# Patient Record
Sex: Male | Born: 1980
Health system: Southern US, Community
[De-identification: ages and names within clinical notes are randomized; demographics above are authoritative.]

## PROBLEM LIST (undated history)

## (undated) DIAGNOSIS — I10 Essential (primary) hypertension: Secondary | ICD-10-CM

## (undated) DIAGNOSIS — E119 Type 2 diabetes mellitus without complications: Secondary | ICD-10-CM

## (undated) HISTORY — PX: TOE SURGERY: SHX1073

## (undated) HISTORY — PX: DENTAL SURGERY: SHX609

## (undated) HISTORY — DX: Type 2 diabetes mellitus without complications: E11.9

---
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
## 2005-01-15 ENCOUNTER — Emergency Department (HOSPITAL_COMMUNITY): Admission: EM | Admit: 2005-01-15 | Discharge: 2005-01-15 | Payer: Self-pay | Admitting: *Deleted

## 2011-05-08 ENCOUNTER — Encounter: Payer: Self-pay | Admitting: *Deleted

## 2011-05-08 ENCOUNTER — Emergency Department (HOSPITAL_COMMUNITY)
Admission: EM | Admit: 2011-05-08 | Discharge: 2011-05-09 | Disposition: A | Discharge status: Home / Self Care | Payer: Self-pay | Attending: Emergency Medicine | Admitting: Emergency Medicine

## 2011-05-08 DIAGNOSIS — K0381 Cracked tooth: Secondary | ICD-10-CM | POA: Insufficient documentation

## 2011-05-08 DIAGNOSIS — K089 Disorder of teeth and supporting structures, unspecified: Secondary | ICD-10-CM | POA: Insufficient documentation

## 2011-05-08 DIAGNOSIS — K0889 Other specified disorders of teeth and supporting structures: Secondary | ICD-10-CM

## 2011-05-08 NOTE — ED Notes (Signed)
Toothache since Friday.  He is getting it removed in 2 weeks

## 2011-05-09 MED ORDER — PENICILLIN V POTASSIUM 500 MG PO TABS: 500.0000 mg | ORAL_TABLET | Freq: Four times a day (QID) | ORAL | Status: AC

## 2011-05-09 MED ORDER — OXYCODONE-ACETAMINOPHEN 5-325 MG PO TABS: 2.0000 | ORAL_TABLET | ORAL | Status: AC | PRN

## 2011-05-09 MED ORDER — OXYCODONE-ACETAMINOPHEN 5-325 MG PO TABS
1.0000 | ORAL_TABLET | Freq: Once | ORAL | Status: AC
  Administered 2011-05-09: 1 via ORAL
  Filled 2011-05-09: qty 1

## 2011-05-09 NOTE — ED Provider Notes (Signed)
History     CSN: [id number] Arrival date & time: 05/08/2011 10:20 PM   First MD Initiated Contact with Patient 05/09/11 0422      Chief Complaint  Patient presents with  . Dental Pain    (Consider location/radiation/quality/duration/timing/severity/associated sxs/prior treatment) Patient is a 30 y.o. male presenting with tooth pain. The history is provided by the patient.  Dental PainThe primary symptoms include mouth pain. Primary symptoms do not include fever, sore throat or cough.  Additional symptoms do not include: drooling and ear pain.   patient has pain in upper neck tooth on left side since Friday. He states that he has an appointment to get it removed in about 2 weeks. Says it hurts when he eats cold things when he eats food. No swelling. No clear trauma. No fevers. No trouble swallowing.  History reviewed. No pertinent past medical history.  History reviewed. No pertinent past surgical history.  History reviewed. No pertinent family history.  History  Substance Use Topics  . Smoking status: Not on file  . Smokeless tobacco: Not on file  . Alcohol Use: No      Review of Systems  Constitutional: Negative for fever and chills.  HENT: Positive for dental problem. Negative for ear pain, sore throat and drooling.   Respiratory: Negative for cough.     Allergies  Sheep-derived products  Home Medications   Current Outpatient Rx  Name Route Sig Dispense Refill  . OXYCODONE-ACETAMINOPHEN 5-325 MG PO TABS Oral Take 2 tablets by mouth every 4 (four) hours as needed for pain. 15 tablet 0  . PENICILLIN V POTASSIUM 500 MG PO TABS Oral Take 1 tablet (500 mg total) by mouth 4 (four) times daily. 40 tablet 0    BP 146/98  Pulse 70  Temp(Src) 97 F (36.1 C) (Oral)  Resp 16  SpO2 100%  Physical Exam  Vitals reviewed. Constitutional: He appears well-developed and well-nourished.  HENT:  Head: Normocephalic.       Left upper posterior seventh tooth from the midline  has a broken off area and is tender. No fluctuance. No swelling of her foot mouth or gums. No bleeding.  Eyes: Pupils are equal, round, and reactive to light.  Neck: Normal range of motion. Neck supple. No thyromegaly present.  Lymphadenopathy:    He has no cervical adenopathy.    ED Course  Procedures (including critical care time)  Labs Reviewed - No data to display No results found.   1. Pain, dental       MDM  Dental pain from a broken tooth. Has followup to get it removed. No clear abscess, will give antibiotics and pain meds.        Juliet Rude. Rubin Payor, MD 05/09/11 [phone]

## 2011-05-09 NOTE — ED Notes (Signed)
No respiratory or acute distress noted resting in bed alert and oriented x 3 call light in reach. 

## 2016-11-14 ENCOUNTER — Emergency Department (HOSPITAL_COMMUNITY)
Admission: EM | Admit: 2016-11-14 | Discharge: 2016-11-14 | Disposition: A | Discharge status: Home / Self Care | Payer: 59 | Attending: Emergency Medicine | Admitting: Emergency Medicine

## 2016-11-14 ENCOUNTER — Encounter (HOSPITAL_COMMUNITY): Payer: Self-pay

## 2016-11-14 ENCOUNTER — Emergency Department (HOSPITAL_COMMUNITY): Payer: 59

## 2016-11-14 DIAGNOSIS — L03031 Cellulitis of right toe: Secondary | ICD-10-CM | POA: Insufficient documentation

## 2016-11-14 DIAGNOSIS — M7989 Other specified soft tissue disorders: Secondary | ICD-10-CM | POA: Diagnosis present

## 2016-11-14 MED ORDER — NAPROXEN 500 MG PO TABS: 500.0000 mg | ORAL_TABLET | Freq: Two times a day (BID) | ORAL | 0 refills | Status: DC

## 2016-11-14 MED ORDER — CEPHALEXIN 500 MG PO CAPS: 500.0000 mg | ORAL_CAPSULE | Freq: Four times a day (QID) | ORAL | 0 refills | Status: DC

## 2016-11-14 NOTE — ED Notes (Signed)
Bed: WTR8 Expected date:  Expected time:  Means of arrival:  Comments: 

## 2016-11-14 NOTE — Discharge Instructions (Signed)
Please read attached information. If you experience any new or worsening signs or symptoms please return to the emergency room for evaluation. Please follow-up with your primary care provider or specialist as discussed. Please use medication prescribed only as directed and discontinue taking if you have any concerning signs or symptoms.   °

## 2016-11-14 NOTE — ED Provider Notes (Signed)
[facility]-EMERGENCY DEPT Provider Note   CSN: [id number] Arrival date & time: 11/14/16  [id number]  By signing my name below, I, Rosario AdieWilliam Andrew Hiatt, attest that this documentation has been prepared under the direction and in the presence of Newell RubbermaidJeffrey Zelena Bushong, PA-C.  Electronically Signed: Rosario AdieWilliam Andrew Hiatt, ED Scribe. 11/14/16. 10:54 AM.  History   Chief Complaint Chief Complaint  Patient presents with  . Toe Injury   The history is provided by the patient. No language interpreter was used.    HPI Comments: Mark Schneider is a 36 y.o. male with no pertinent PMHx, who presents to the Emergency Department complaining of  left great toe pain beginning yesterday. Per pt, he does not remember a specific injury event to the area, however, he thinks that he may have struck the toe while walking recently. He notes associated swelling over the toe. His pain is exacerbated with movement of the joint. No treatments for his pain were tried prior to coming into the ED. He denies numbness, weakness, or any other associated symptoms.   History reviewed. No pertinent past medical history.  There are no active problems to display for this patient.  Past Surgical History:  Procedure Laterality Date  . DENTAL SURGERY    . TOE SURGERY      Home Medications    Prior to Admission medications   Medication Sig Start Date End Date Taking? Authorizing Provider  cephALEXin (KEFLEX) 500 MG capsule Take 1 capsule (500 mg total) by mouth 4 (four) times daily. 11/14/16   Thayne Cindric, Tinnie GensJeffrey, PA-C  naproxen (NAPROSYN) 500 MG tablet Take 1 tablet (500 mg total) by mouth 2 (two) times daily. 11/14/16   Eyvonne MechanicHedges, Aysa Larivee, PA-C   Family History History reviewed. No pertinent family history.  Social History Social History  Substance Use Topics  . Smoking status: Never Smoker  . Smokeless tobacco: Never Used  . Alcohol use No   Allergies   Sheep-derived products and Shellfish allergy  Review of Systems Review of  Systems  Musculoskeletal: Positive for arthralgias, joint swelling and myalgias.  Neurological: Negative for weakness and numbness.  All other systems reviewed and are negative.  Physical Exam Updated Vital Signs BP (!) 179/102 (BP Location: Left Arm)   Pulse 91   Temp 98.8 F (37.1 C) (Oral)   Resp (!) 22   Ht 6' (1.829 m)   Wt (!) 140.6 kg   SpO2 98%   BMI 42.04 kg/m   Physical Exam  Constitutional: He appears well-developed and well-nourished. No distress.  HENT:  Head: Normocephalic and atraumatic.  Eyes: Conjunctivae are normal.  Neck: Normal range of motion.  Cardiovascular: Normal rate.   Pulmonary/Chest: Effort normal.  Abdominal: He exhibits no distension.  Musculoskeletal: Normal range of motion. He exhibits edema and tenderness.  Swelling along the dorsal aspect of the left great toe. There is TTP. No fluctuance. Slight warmth and redness to dorsal aspect. Plantar aspect NTTP with no redness or warmth  Neurological: He is alert.  Skin: No pallor.  Psychiatric: He has a normal mood and affect. His behavior is normal.  Nursing note and vitals reviewed.  ED Treatments / Results  DIAGNOSTIC STUDIES: Oxygen Saturation is 98% on RA, normal by my interpretation.   COORDINATION OF CARE: 10:54 AM-Discussed next steps with pt. Pt verbalized understanding and is agreeable with the plan.   Radiology Dg Toe Great Left  Result Date: 11/14/2016 CLINICAL DATA:  Great toe pain. EXAM: LEFT GREAT TOE COMPARISON:  None. FINDINGS: There  is no evidence of fracture or dislocation. There is no evidence of arthropathy or other focal bone abnormality. Soft tissues are unremarkable. IMPRESSION: Negative. Electronically Signed   By: Francene Boyers M.D.   On: 11/14/2016 11:52    Procedures Procedures   Medications Ordered in ED Medications - No data to display  Initial Impression / Assessment and Plan / ED Course  I have reviewed the triage vital signs and the nursing  notes.  Pertinent labs & imaging results that were available during my care of the patient were reviewed by me and considered in my medical decision making (see chart for details).     Labs:   Imaging: DG L great toe  Consults:   Therapeutics:   Discharge Meds: Keflex, ibuprofen  Assessment/Plan: 36 year old male presents today with complaints of toe pain. He denies any trauma. Patient does have some slight warmth and signs of cellulitis to his toe. I cannot appreciate any significant abscess that would be amenable to I&D, ultrasound at bedside should note fluid collection. Patient does not have any history of gout, I have lower suspicion for gouty arthritis, septic arthritis as this seems to be superficial over the dorsal aspect. I will cover patient with Keflex for likely infection, ibuprofen as needed for discomfort, and close follow-up with primary care or the ED if symptoms worsen. Patient is afebrile and nontoxic with no signs of systemic illness. Pt verbalized understanding and agreement to today's plan had no further questions or concerns at the time of discharge.    Final Clinical Impressions(s) / ED Diagnoses   Final diagnoses:  Cellulitis of toe of right foot   New Prescriptions Discharge Medication List as of 11/14/2016  1:06 PM    START taking these medications   Details  cephALEXin (KEFLEX) 500 MG capsule Take 1 capsule (500 mg total) by mouth 4 (four) times daily., Starting Tue 11/14/2016, Print    naproxen (NAPROSYN) 500 MG tablet Take 1 tablet (500 mg total) by mouth 2 (two) times daily., Starting Tue 11/14/2016, Print       I personally performed the services described in this documentation, which was scribed in my presence. The recorded information has been reviewed and is accurate.       Eyvonne Mechanic, PA-C 11/14/16 1801    Mancel Bale, MD 11/14/16 2003

## 2016-11-14 NOTE — ED Triage Notes (Signed)
Patient c/o left great toe pain since yesterday and thinks he hit his toe on  The wall or coffee table. Patient states the pain impairs weight-bearing on the foot.

## 2017-05-08 ENCOUNTER — Other Ambulatory Visit: Payer: Self-pay

## 2017-05-08 ENCOUNTER — Emergency Department (HOSPITAL_COMMUNITY)
Admission: EM | Admit: 2017-05-08 | Discharge: 2017-05-08 | Disposition: A | Discharge status: Home / Self Care | Payer: 59 | Attending: Emergency Medicine | Admitting: Emergency Medicine

## 2017-05-08 ENCOUNTER — Encounter (HOSPITAL_COMMUNITY): Payer: Self-pay | Admitting: Emergency Medicine

## 2017-05-08 DIAGNOSIS — Z79899 Other long term (current) drug therapy: Secondary | ICD-10-CM | POA: Diagnosis not present

## 2017-05-08 DIAGNOSIS — M79671 Pain in right foot: Secondary | ICD-10-CM | POA: Diagnosis present

## 2017-05-08 NOTE — ED Triage Notes (Signed)
Pt complaint of right foot pain onset this am with weight bearing; denies injury. Hx of cellulitis to left foot; pt concerned for same. No obvious signs of infection noted with triage.

## 2017-05-08 NOTE — ED Provider Notes (Signed)
[facility]-EMERGENCY DEPT Provider Note   CSN: [id number] Arrival date & time: 05/08/17  1133     History   Chief Complaint Chief Complaint  Patient presents with  . Foot Pain    HPI Mark Schneider is a 36 y.o. male who presents today with chief complaint acute onset, intermittent right foot pain which began early this morning.  States that he noticed the pain when he awoke, worsens with ambulation and standing for long periods of time.  Pain is throbbing and sharp in nature.  It radiates from the plantar surface of the heel up to the arch.  Denies numbness, tingling, weakness, fevers, chills.  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  He has tried ibuprofen and leftover antibiotic (of which he is unsure of the name) which he has from a cellulitis which occurred in his left toe several months ago.  The history is provided by the patient.    History reviewed. No pertinent past medical history.  There are no active problems to display for this patient.   Past Surgical History:  Procedure Laterality Date  . DENTAL SURGERY    . TOE SURGERY         Home Medications    Prior to Admission medications   Medication Sig Start Date End Date Taking? Authorizing Provider  cephALEXin (KEFLEX) 500 MG capsule Take 1 capsule (500 mg total) by mouth 4 (four) times daily. 11/14/16   Hedges, Tinnie GensJeffrey, PA-C  naproxen (NAPROSYN) 500 MG tablet Take 1 tablet (500 mg total) by mouth 2 (two) times daily. 11/14/16   Eyvonne MechanicHedges, Jeffrey, PA-C    Family History No family history on file.  Social History Social History   Tobacco Use  . Smoking status: Never Smoker  . Smokeless tobacco: Never Used  Substance Use Topics  . Alcohol use: No  . Drug use: No     Allergies   Sheep-derived products and Shellfish allergy   Review of Systems Review of Systems  Constitutional: Negative for chills and fever.  Musculoskeletal:  Positive for arthralgias (Right foot).  Neurological: Negative for weakness and numbness.     Physical Exam Updated Vital Signs BP 111/67 (BP Location: Right Arm)   Pulse 89   Temp 98.6 F (37 C) (Oral)   Resp 18   Physical Exam  Constitutional: He appears well-developed and well-nourished. No distress.  HENT:  Head: Normocephalic and atraumatic.  Eyes: Conjunctivae are normal. Right eye exhibits no discharge. Left eye exhibits no discharge.  Neck: No JVD present. No tracheal deviation present.  Cardiovascular: Normal rate and intact distal pulses.  2+ DP/PT pulses bilaterally.  Pulmonary/Chest: Effort normal.  Abdominal: He exhibits no distension.  Musculoskeletal: Normal range of motion. He exhibits tenderness. He exhibits no edema.  Normal range of motion of the right ankle and foot.  5/5 strength with flexion and extension against resistance of the right foot major muscle groups including EHL. no swelling, warmth, erythema, deformity, or crepitus noted.  He is tender to palpation on the plantar surface of the right foot from the anterior aspect of the heel to the balls of the feet.  Examination of the Achilles tendon is normal.  Neurological: He is alert. No sensory deficit. He exhibits normal muscle tone.  Fluent speech, no facial droop, sensation intact to soft touch of bilateral lower extremities.  Antalgic gait, but  patient able to Heel Walk and Toe Walk  although it is painful.  Skin: Skin is warm and dry. No erythema.  Psychiatric: He has a normal mood and affect. His behavior is normal.  Nursing note and vitals reviewed.    ED Treatments / Results  Labs (all labs ordered are listed, but only abnormal results are displayed) Labs Reviewed - No data to display  EKG  EKG Interpretation None       Radiology No results found.  Procedures Procedures (including critical care time)  Medications Ordered in ED Medications - No data to display   Initial  Impression / Assessment and Plan / ED Course  I have reviewed the triage vital signs and the nursing notes.  Pertinent labs & imaging results that were available during my care of the patient were reviewed by me and considered in my medical decision making (see chart for details).     Patient with right heel and foot pain.  No trauma or falls, but he does walk and stand for long periods of time at work.  Afebrile, vital signs are stable.  History and physical examination suggestive of plantar fasciitis.  Low suspicion of fracture, dislocation, cellulitis, gout, or septic joints.  Therapy indicated and discussed, discharge patient with Cam walker and crutches.RICE he will follow-up with podiatry if symptoms persist.  Also given information of stretching exercises and taping for relief.  Discussed indications for return to the ED. Pt verbalized understanding of and agreement with plan and is safe for discharge home at this time.  No complaints prior to discharge.  Final Clinical Impressions(s) / ED Diagnoses   Final diagnoses:  Acute pain of right foot    ED Discharge Orders    None       Bennye AlmFawze, Braylei Totino A, PA-C 05/08/17 1228    Azalia Bilisampos, Kevin, MD 05/09/17 2044

## 2017-05-08 NOTE — Discharge Instructions (Signed)
Your symptoms and physical exam are suggestive of plantar fasciitis. Alternate 600 mg of ibuprofen and [phone] mg of Tylenol every 3 hours as needed for pain. Do not exceed 4000 mg of Tylenol daily.  Wear the Cam walker and use the crutches to ambulate for comfort.  I have attached stretching exercises and foot taping information that may be helpful to you as well.  Follow-up with a podiatrist for reevaluation.  Return to the ED immediately for any concerning signs or symptoms develop such as fever, redness, swelling, or weakness.  How is plantar fasciitis treated? -- The first step is to try the things you can do on your own. But if you do not get better, or your symptoms are severe, your doctor or nurse might suggest:  ?Taping up your foot in a special way that helps the support the foot (picture 1)  ?Special shoe inserts, made to fit your foot  ?Shots (that go into your foot) of a medicine called a steroid, which can help with the pain  ?Putting a splint over your foot and ankle  ?Surgery (this is an option only in some cases that do not get better with other treatments)  Some doctors also suggest a treatment called "shock wave therapy." This treatment is painful and has not been proven to work.  Is there anything I can do to keep from getting heel pain again? -- Yes. To reduce the chances that your pain will come back:  ?Wear shoes that fit well, have a lot of cushion, and support the heel and ankle  ?Avoid wearing slippers, flip-flops, slip-ons, or poorly fitted shoes  ?Avoid going barefoot  ?Do not wear worn-out shoes

## 2017-05-13 ENCOUNTER — Encounter (HOSPITAL_COMMUNITY): Payer: Self-pay | Admitting: Emergency Medicine

## 2017-05-13 ENCOUNTER — Ambulatory Visit (HOSPITAL_COMMUNITY)
Admission: EM | Admit: 2017-05-13 | Discharge: 2017-05-13 | Disposition: A | Discharge status: Home / Self Care | Payer: 59 | Attending: Family Medicine | Admitting: Family Medicine

## 2017-05-13 DIAGNOSIS — M7741 Metatarsalgia, right foot: Secondary | ICD-10-CM | POA: Diagnosis not present

## 2017-05-13 NOTE — ED Triage Notes (Signed)
Pt here for right foot pain; pt sts seen here for same with some improvement currently

## 2017-05-13 NOTE — ED Provider Notes (Signed)
[facility]    CSN: [id number] Arrival date & time: 05/13/17  1205     History   Chief Complaint Chief Complaint  Patient presents with  . Foot Pain    HPI Melanie A Rahl is a 36 y.o. male.   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 He was recently evaluated for this pain and given Aleve. He took a week off from work last week and his foot is getting better but still hurting.      History reviewed. No pertinent past medical history.  There are no active problems to display for this patient.   Past Surgical History:  Procedure Laterality Date  . DENTAL SURGERY    . TOE SURGERY         Home Medications    Prior to Admission medications   Not on File    Family History History reviewed. No pertinent family history.  Social History Social History   Tobacco Use  . Smoking status: Never Smoker  . Smokeless tobacco: Never Used  Substance Use Topics  . Alcohol use: No  . Drug use: No     Allergies   Sheep-derived products and Shellfish allergy   Review of Systems Review of Systems  Musculoskeletal: Positive for gait problem.  All other systems reviewed and are negative.    Physical Exam Triage Vital Signs ED Triage Vitals [05/13/17 1255]  Enc Vitals Group     BP (!) 180/102     Pulse Rate 84     Resp 18     Temp 98 F (36.7 C)     Temp Source Oral     SpO2 99 %     Weight      Height      Head Circumference      Peak Flow      Pain Score 4     Pain Loc      Pain Edu?      Excl. in GC?    No data found.  Updated Vital Signs BP (!) 180/102 (BP Location: Right Arm)   Pulse 84   Temp 98 F (36.7 C) (Oral)   Resp 18   SpO2 99%   Visual Acuity Right Eye Distance:   Left Eye Distance:   Bilateral Distance:    Right Eye Near:   Left Eye Near:    Bilateral Near:     Physical Exam  Constitutional: He is oriented to person, place, and time. He appears well-developed  and well-nourished.  HENT:  Right Ear: External ear normal.  Left Ear: External ear normal.  Eyes: Conjunctivae are normal. Pupils are equal, round, and reactive to light.  Neck: Normal range of motion. Neck supple.  Pulmonary/Chest: Effort normal.  Musculoskeletal: Normal range of motion.  Tender metatarsal arch area  Neurological: He is alert and oriented to person, place, and time.  Skin: Skin is warm and dry.  Nursing note and vitals reviewed.    UC Treatments / Results  Labs (all labs ordered are listed, but only abnormal results are displayed) Labs Reviewed - No data to display  EKG  EKG Interpretation None       Radiology No results found.  Procedures Procedures (including critical care time)  Medications Ordered in UC Medications - No data to display   Initial Impression / Assessment and Plan / UC Course  I have reviewed the triage vital signs and the nursing  notes.  Pertinent labs & imaging results that were available during my care of the patient were reviewed by me and considered in my medical decision making (see chart for details).    Final Clinical Impressions(s) / UC Diagnoses   Final diagnoses:  Metatarsalgia of right foot    ED Discharge Orders    None       Controlled Substance Prescriptions [place] Controlled Substance Registry consulted? Not Applicable   Elvina SidleLauenstein, Ewald Beg, MD 05/13/17 1339

## 2017-05-13 NOTE — Discharge Instructions (Signed)
Get a metatarsal arch insert for your shoe. Ask about better cushioned shoes.  Ibuprofen should be good for controlling some of the discomfort or graft to lose weight, you will be hungry some the time. Rely on Seltzer water or sparkling water to help control the appetite. Avoid carbohydrates: Potatoes, possibly, rice, bread.  Rely on protein to help with the appetite suppression: Egg whites scrambled, snacks such as fruit slices for snacks.

## 2017-10-31 DIAGNOSIS — E119 Type 2 diabetes mellitus without complications: Secondary | ICD-10-CM

## 2017-10-31 HISTORY — DX: Type 2 diabetes mellitus without complications: E11.9

## 2017-11-15 ENCOUNTER — Other Ambulatory Visit: Payer: Self-pay

## 2017-11-15 ENCOUNTER — Observation Stay (HOSPITAL_COMMUNITY)
Admission: EM | Admit: 2017-11-15 | Discharge: 2017-11-16 | Disposition: A | Discharge status: Home / Self Care | Payer: 59 | Attending: Internal Medicine | Admitting: Internal Medicine

## 2017-11-15 ENCOUNTER — Encounter (HOSPITAL_COMMUNITY): Payer: Self-pay | Admitting: Nurse Practitioner

## 2017-11-15 DIAGNOSIS — Z6841 Body Mass Index (BMI) 40.0 and over, adult: Secondary | ICD-10-CM | POA: Insufficient documentation

## 2017-11-15 DIAGNOSIS — Z7984 Long term (current) use of oral hypoglycemic drugs: Secondary | ICD-10-CM | POA: Insufficient documentation

## 2017-11-15 DIAGNOSIS — E1165 Type 2 diabetes mellitus with hyperglycemia: Principal | ICD-10-CM | POA: Insufficient documentation

## 2017-11-15 DIAGNOSIS — R7989 Other specified abnormal findings of blood chemistry: Secondary | ICD-10-CM

## 2017-11-15 DIAGNOSIS — E119 Type 2 diabetes mellitus without complications: Secondary | ICD-10-CM

## 2017-11-15 DIAGNOSIS — N179 Acute kidney failure, unspecified: Secondary | ICD-10-CM

## 2017-11-15 HISTORY — DX: Acute kidney failure, unspecified: N17.9

## 2017-11-15 LAB — URINALYSIS, ROUTINE W REFLEX MICROSCOPIC
Bilirubin Urine: NEGATIVE
HGB URINE DIPSTICK: NEGATIVE
Ketones, ur: NEGATIVE mg/dL
Leukocytes, UA: NEGATIVE
Nitrite: NEGATIVE
Protein, ur: NEGATIVE mg/dL
SPECIFIC GRAVITY, URINE: 1.027 (ref 1.005–1.030)
pH: 5 (ref 5.0–8.0)

## 2017-11-15 LAB — GLUCOSE, CAPILLARY
GLUCOSE-CAPILLARY: 431 mg/dL — AB (ref 65–99)
Glucose-Capillary: 569 mg/dL (ref 65–99)

## 2017-11-15 LAB — CBC
HEMATOCRIT: 42.8 % (ref 39.0–52.0)
HEMOGLOBIN: 15.2 g/dL (ref 13.0–17.0)
MCH: 30.8 pg (ref 26.0–34.0)
MCHC: 35.5 g/dL (ref 30.0–36.0)
MCV: 86.8 fL (ref 78.0–100.0)
Platelets: 204 10*3/uL (ref 150–400)
RBC: 4.93 MIL/uL (ref 4.22–5.81)
RDW: 12.5 % (ref 11.5–15.5)
WBC: 7 10*3/uL (ref 4.0–10.5)

## 2017-11-15 LAB — BASIC METABOLIC PANEL
ANION GAP: 14 (ref 5–15)
BUN: 19 mg/dL (ref 6–20)
CHLORIDE: 94 mmol/L — AB (ref 101–111)
CO2: 24 mmol/L (ref 22–32)
Calcium: 9.6 mg/dL (ref 8.9–10.3)
Creatinine, Ser: 1.84 mg/dL — ABNORMAL HIGH (ref 0.61–1.24)
GFR calc Af Amer: 53 mL/min — ABNORMAL LOW (ref >60)
GFR, EST NON AFRICAN AMERICAN: 46 mL/min — AB (ref >60)
GLUCOSE: 755 mg/dL — AB (ref 65–99)
POTASSIUM: 4.5 mmol/L (ref 3.5–5.1)
Sodium: 132 mmol/L — ABNORMAL LOW (ref 135–145)

## 2017-11-15 LAB — CBG MONITORING, ED: Glucose-Capillary: >600 mg/dL (ref 65–99)

## 2017-11-15 LAB — HEMOGLOBIN A1C
Hgb A1c MFr Bld: 10.5 % — ABNORMAL HIGH (ref 4.8–5.6)
Mean Plasma Glucose: 254.65 mg/dL

## 2017-11-15 MED ORDER — ONDANSETRON HCL 4 MG PO TABS: 4.0000 mg | ORAL_TABLET | Freq: Four times a day (QID) | ORAL | Status: DC | PRN

## 2017-11-15 MED ORDER — ACETAMINOPHEN 325 MG PO TABS
650.0000 mg | ORAL_TABLET | Freq: Four times a day (QID) | ORAL | Status: DC | PRN
  Administered 2017-11-16: 650 mg via ORAL
  Filled 2017-11-15: qty 2

## 2017-11-15 MED ORDER — SODIUM CHLORIDE 0.9 % IV SOLN
INTRAVENOUS | Status: DC
  Filled 2017-11-15: qty 1

## 2017-11-15 MED ORDER — INSULIN ASPART 100 UNIT/ML ~~LOC~~ SOLN
0.0000 [IU] | SUBCUTANEOUS | Status: DC
  Administered 2017-11-15: 20 [IU] via SUBCUTANEOUS
  Administered 2017-11-16: 15 [IU] via SUBCUTANEOUS
  Administered 2017-11-16: 4 [IU] via SUBCUTANEOUS
  Administered 2017-11-16: 20 [IU] via SUBCUTANEOUS
  Administered 2017-11-16: 15 [IU] via SUBCUTANEOUS

## 2017-11-15 MED ORDER — DOCUSATE SODIUM 100 MG PO CAPS
100.0000 mg | ORAL_CAPSULE | Freq: Two times a day (BID) | ORAL | Status: DC
  Administered 2017-11-15: 100 mg via ORAL
  Filled 2017-11-15: qty 1

## 2017-11-15 MED ORDER — SODIUM CHLORIDE 0.9 % IV BOLUS
1000.0000 mL | Freq: Once | INTRAVENOUS | Status: AC
  Administered 2017-11-15: 1000 mL via INTRAVENOUS

## 2017-11-15 MED ORDER — INSULIN ASPART 100 UNIT/ML ~~LOC~~ SOLN
10.0000 [IU] | Freq: Once | SUBCUTANEOUS | Status: AC
  Administered 2017-11-15: 10 [IU] via SUBCUTANEOUS
  Filled 2017-11-15: qty 1

## 2017-11-15 MED ORDER — INSULIN GLARGINE 100 UNIT/ML ~~LOC~~ SOLN
10.0000 [IU] | Freq: Every day | SUBCUTANEOUS | Status: DC
  Administered 2017-11-15: 10 [IU] via SUBCUTANEOUS
  Filled 2017-11-15 (×2): qty 0.1

## 2017-11-15 MED ORDER — ONDANSETRON HCL 4 MG/2ML IJ SOLN: 4.0000 mg | Freq: Four times a day (QID) | INTRAMUSCULAR | Status: DC | PRN

## 2017-11-15 MED ORDER — ACETAMINOPHEN 650 MG RE SUPP: 650.0000 mg | Freq: Four times a day (QID) | RECTAL | Status: DC | PRN

## 2017-11-15 MED ORDER — SODIUM CHLORIDE 0.9 % IV SOLN
INTRAVENOUS | Status: DC
  Administered 2017-11-15: 19:00:00 via INTRAVENOUS

## 2017-11-15 MED ORDER — SODIUM CHLORIDE 0.9 % IV SOLN
INTRAVENOUS | Status: DC
  Administered 2017-11-15 – 2017-11-16 (×2): via INTRAVENOUS

## 2017-11-15 NOTE — ED Notes (Signed)
Date and time results received: 11/15/17  4:59 PM  (use smartphrase ".now" to insert current time)  Test: CBG Critical Value: 755  Name of Provider Notified: Notified Rise Mu, RN  Orders Received? Or Actions Taken?: Will continue to monitor patient.

## 2017-11-15 NOTE — ED Triage Notes (Signed)
Patient here for possible bladder infection. He is having frequency and burning while urinating. Patient also stated when he woke up this morning his pinky finger was numb for a little while.

## 2017-11-15 NOTE — H&P (Signed)
History and Physical    Kayleb A Buehring ZOX:[id number] DOB: 1980/12/23 DOA: 11/15/2017  PCP:   Deboraha Sprang at [facility]:  None Patient coming from:  Home - lives with Steffanie Rainwater; NOK:  Steffanie Rainwater, mother, sister - [phone]   Chief Complaint:  ?UTI  HPI: Mark Schneider is a 37 y.o. male with no significant past medical history other than morbid obesity (BMP 55) presenting with symptomatic new-onset DM.  For the past 1-2 weeks, he has been having polyuria and not sleeping well, very tired and sluggish.  Poor appetite recently.  The one night he did eat a lot, he had a lot of GI upset.  Nausea without vomiting, abdominal pain.  +polyuria.  He drank 4 cups on water in the ER so far.  UOP maybe 10 times daily, 4-5 times of nocturia.     ED Course:  Came in with polyuria/polydipsia, new DM.  Glucose 755.  [facility] in 10/18, no apparent DM then.  Recommended an insulin drip and IVF.  No evidence of DKA, normal gap.    Review of Systems: As per HPI; otherwise review of systems reviewed and negative.   Ambulatory Status:  Ambulates without assistance  History reviewed. No pertinent past medical history.  Past Surgical History:  Procedure Laterality Date  . DENTAL SURGERY    . TOE SURGERY      Social History   Socioeconomic History  . Marital status: Single    Spouse name: Not on file  . Number of children: Not on file  . Years of education: Not on file  . Highest education level: Not on file  Occupational History  . 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 Financial resource strain: Not on file  . Food insecurity:    Worry: Not on file    Inability: Not on file  . Transportation needs:    Medical: Not on file    Non-medical: Not on file  Tobacco Use  . Smoking status: Never Smoker  . Smokeless tobacco: Never Used  Substance and Sexual Activity  . Alcohol use: No  . Drug use: No  . Sexual activity: Not Currently  Lifestyle  . Physical activity:    Days  per week: Not on file    Minutes per session: Not on file  . Stress: Not on file  Relationships  . Social connections:    Talks on phone: Not on file    Gets together: Not on file    Attends religious service: Not on file    Active member of club or organization: Not on file    Attends meetings of clubs or organizations: Not on file    Relationship status: Not on file  . Intimate partner violence:    Fear of current or ex partner: Not on file    Emotionally abused: Not on file    Physically abused: Not on file    Forced sexual activity: Not on file  Other Topics Concern  . Not on file  Social History Narrative  . Not on file    Allergies  Allergen Reactions  . Sheep-Derived Products Swelling  . Shellfish Allergy     Family History  Problem Relation Age of Onset  . Diabetes Mother   . Diabetes Sister   . Diabetes Maternal Grandmother   . Diabetes Maternal Uncle     Prior to Admission medications   Not on File    Physical Exam: Vitals:   11/15/17 1526 11/15/17  1541  BP: (!) 140/99   Pulse: (!) 103   Resp: 18   Temp: 98.3 F (36.8 C)   TempSrc: Oral   SpO2: 97%   Weight: (!) 172.4 kg (380 lb) (!) 172.4 kg (380 lb)  Height:  (1.778 m)  (1.778 m)     General:  Appears calm and comfortable and is NAD Eyes:  PERRL, EOMI, normal lids, iris ENT:  grossly normal hearing, lips & tongue, mmm; appropriate dentition Neck:  no LAD, masses or thyromegaly Cardiovascular:  RRR, no m/r/g. No LE edema.  Respiratory:   CTA bilaterally with no wheezes/rales/rhonchi.  Normal respiratory effort. Abdomen:  soft, NT, ND, NABS Back:   normal alignment, no CVAT Skin:  no rash or induration seen on limited exam Musculoskeletal:  grossly normal tone BUE/BLE, good ROM, no bony abnormality Psychiatric:  grossly normal mood and affect, speech fluent and appropriate, AOx3 Neurologic:  CN 2-12 grossly intact, moves all extremities in coordinated fashion, sensation  intact    Radiological Exams on Admission: No results found.  EKG:  Not done   Labs on Admission: I have personally reviewed the available labs and imaging studies at the time of the admission.  Pertinent labs:   Na++ 132 Glucose 755 BUN 19/Creatinine 1.84/GFR 53 Normal CBC UA: >500 glucose   Assessment/Plan Principal Problem:   New onset type 2 diabetes mellitus (HCC) Active Problems:   Acute renal failure (ARF) (HCC)   Morbid obesity with BMI of 50.0-59.9, adult (HCC)   New onset type 2 DM -Patient presenting with symptomatic new-onset DM -Normal anion gap, normal CO2 -While this could be managed as an outpatient, it is reasonable to observe him overnight given the significant hyperglycemia -Significant diabetes education provided at the time of the encounter including discussion about all of the sequelae of chronic diabetes and the importance of good glycemic control -While he is unlikely to need long-standing insulin with new type 2 DM, he will need insulin for now to try to obtain improved control -An insulin drip is not necessary at this time -Will hydrate and give 10 units Lantus tonight as well as q4h SSI coverage -Formal diabetes education tomorrow -Anticipate d/c to home tomorrow and will need good outpatient f/u -CM consult to assist with ensuring adequate outpatient f/u as well as diabetes supplies -He will need an outpatient eye exam and good foot care. -IVF for now at 150 cc/hour - no boluses since his bicarb is normal and he does not have an anion gap.  Acute renal failure  -Baseline creatinine is unknown, but suspect that this otherwise healthy male has relatively normal baseline renal function.   -Likely due to prerenal failure secondary to dehydration in the setting of new-onset DM and intermittent NSAIDs use. -He is likely to need an ACE in the future for renal protection from DM. -IVF as above -Follow up renal function by BMP -Avoid ACEI and NSAIDs  for now.  Morbid obesity -Dietary counseling provided but he will need ongoing outpatient encouragement of good dietary habits -He will need to start a weight loss program including diet and exercise.   DVT prophylaxis: Early ambulation Code Status:  Full  Family Communication: Steffanie Rainwater present throughout evaluation  Disposition Plan:  Home once clinically improved Consults called: Diabetes coordinator; CM  Admission status: It is my clinical opinion that referral for OBSERVATION is reasonable and necessary in this patient based on the above information provided. The aforementioned taken together are felt to place the  patient at high risk for further clinical deterioration. However it is anticipated that the patient may be medically stable for discharge from the hospital within 24 to 48 hours.    Jonah Blue MD [facility]  If note is complete, please contact covering daytime or nighttime physician. [email or web address] Password [email or web address]  11/15/2017, 8:29 PM

## 2017-11-15 NOTE — ED Provider Notes (Signed)
[facility]-EMERGENCY DEPT Provider Note   CSN: [id number] Arrival date & time: 11/15/17  1508     History   Chief Complaint Chief Complaint  Patient presents with  . Hyperglycemia    HPI Mark Schneider is a 37 y.o. male with no significant past medical history originally presented to the emergency department today for concerns for possible UTI.  Patient states over the last 2 weeks he has been having polydipsia as well as polyuria.  He notes that on Monday he had some abdominal cramping as well as some nausea.  He thought his symptoms may be related to a urinary tract infection so he came into be evaluated.  He was noted to have an elevated blood sugar on his triage lab work.  He states he has significant family history of diabetes but none personally.  He states he does have a PCP with [facility] with his last appointment in October.  Patient denies any current fever, chills, chest pain, shortness of breath, abdominal pain, nausea/vomiting/diarrhea, dysuria, hematuria, flank pain, or any other symptoms.  HPI  History reviewed. No pertinent past medical history.  There are no active problems to display for this patient.   Past Surgical History:  Procedure Laterality Date  . DENTAL SURGERY    . TOE SURGERY          Home Medications    Prior to Admission medications   Not on File    Family History History reviewed. No pertinent family history.  Social History Social History   Tobacco Use  . Smoking status: Never Smoker  . Smokeless tobacco: Never Used  Substance Use Topics  . Alcohol use: No  . Drug use: No     Allergies   Sheep-derived products and Shellfish allergy   Review of Systems Review of Systems  All other systems reviewed and are negative.    Physical Exam Updated Vital Signs BP (!) 140/99 (BP Location: Left Arm)   Pulse (!) 103   Temp 98.3 F (36.8 C) (Oral)   Resp 18   Ht  (1.778 m)   Wt (!) 172.4 kg (380 lb)    SpO2 97%   BMI 54.52 kg/m   Physical Exam  Constitutional: He appears well-developed and well-nourished.  Obese male in no acute distress  HENT:  Head: Normocephalic and atraumatic.  Right Ear: External ear normal.  Left Ear: External ear normal.  Nose: Nose normal.  Mouth/Throat: Uvula is midline, oropharynx is clear and moist and mucous membranes are normal. No tonsillar exudate.  Eyes: Pupils are equal, round, and reactive to light. Right eye exhibits no discharge. Left eye exhibits no discharge. No scleral icterus.  Neck: Trachea normal. Neck supple. No spinous process tenderness present. No neck rigidity. Normal range of motion present.  Cardiovascular: Normal rate, regular rhythm and intact distal pulses.  No murmur heard. Pulses:      Radial pulses are 2+ on the right side, and 2+ on the left side.       Dorsalis pedis pulses are 2+ on the right side, and 2+ on the left side.       Posterior tibial pulses are 2+ on the right side, and 2+ on the left side.  No lower extremity swelling or edema. Calves symmetric in size bilaterally.  Pulmonary/Chest: Effort normal and breath sounds normal. He exhibits no tenderness.  Abdominal: Soft. Bowel sounds are normal. There is no tenderness. There is no rigidity, no rebound, no guarding and  no CVA tenderness.  Musculoskeletal: He exhibits no edema.  Lymphadenopathy:    He has no cervical adenopathy.  Neurological: He is alert.  Skin: Skin is warm and dry. No rash noted. He is not diaphoretic.  Psychiatric: He has a normal mood and affect.  Nursing note and vitals reviewed.    ED Treatments / Results  Labs (all labs ordered are listed, but only abnormal results are displayed) Labs Reviewed  URINALYSIS, ROUTINE W REFLEX MICROSCOPIC - Abnormal; Notable for the following components:      Result Value   Color, Urine COLORLESS (*)    Glucose, UA >=500 (*)    Bacteria, UA RARE (*)    All other components within normal limits  BASIC  METABOLIC PANEL - Abnormal; Notable for the following components:   Sodium 132 (*)    Chloride 94 (*)    Glucose, Bld 755 (*)    Creatinine, Ser 1.84 (*)    GFR calc non Af Amer 46 (*)    GFR calc Af Amer 53 (*)    All other components within normal limits  CBC    EKG None  Radiology No results found.  Procedures Procedures (including critical care time) CRITICAL CARE Performed by: Jacinto Halim   Total critical care time: 30 minutes  Critical care time was exclusive of separately billable procedures and treating other patients.  Critical care was necessary to treat or prevent imminent or life-threatening deterioration.  Critical care was time spent personally by me on the following activities: development of treatment plan with patient and/or surrogate as well as nursing, discussions with consultants, evaluation of patient's response to treatment, examination of patient, obtaining history from patient or surrogate, ordering and performing treatments and interventions, ordering and review of laboratory studies, ordering and review of radiographic studies, pulse oximetry and re-evaluation of patient's condition.  Medications Ordered in ED Medications  sodium chloride 0.9 % bolus 1,000 mL (1,000 mLs Intravenous New Bag/Given 11/15/17 1727)  0.9 %  sodium chloride infusion (has no administration in time range)  insulin aspart (novoLOG) injection 10 Units (has no administration in time range)     Initial Impression / Assessment and Plan / ED Course  I have reviewed the triage vital signs and the nursing notes.  Pertinent labs & imaging results that were available during my care of the patient were reviewed by me and considered in my medical decision making (see chart for details).     37 year old male that presents with what appears to be new onset diabetes with a glucose of 755 on BMP.  Patient's most recent blood work was done by his PCP in October [facility] - he is unsure  of the name of his PCP) and he reports he did not have hyperglycemia during that time.  Patient is currently complaining of polydipsia as well as polyuria.  He notes no other symptoms at this current time.  Patient is noted to be slightly tachycardic at 103.  Labs are also significant for hyponatremia of 132, as well as a creatinine of 1.84.  No baseline creatinine to compare to.  Patient's UA without evidence of ketones in the urine or UTI.  Patient with normal anion gap.  He is not in DKA however after speaking with my attending, Dr. Adela Lank, he recommendeds starting patient on insulin drip and getting the patient admitted to the hospitalist service for further management and teaching. I discussed this with the patient and he is in agreement with the plan.  Discussed case with Dr. Ophelia Charter. Recommended canceling the insulin drip and starting the patient on 10U subq insulin. Will order additional L of fluids on 1L bolus and maintenance fluids already ordered.  I appreciate her for bringing the patient into the hospital.  Final Clinical Impressions(s) / ED Diagnoses   Final diagnoses:  Diabetes mellitus, new onset [facility])    ED Discharge Orders    None       Jacinto Halim, PA-C 11/15/17 1829    Melene Plan, DO 11/15/17 1947

## 2017-11-16 ENCOUNTER — Other Ambulatory Visit: Payer: Self-pay

## 2017-11-16 DIAGNOSIS — Z6841 Body Mass Index (BMI) 40.0 and over, adult: Secondary | ICD-10-CM

## 2017-11-16 DIAGNOSIS — N179 Acute kidney failure, unspecified: Secondary | ICD-10-CM

## 2017-11-16 LAB — BASIC METABOLIC PANEL
ANION GAP: 9 (ref 5–15)
BUN: 15 mg/dL (ref 6–20)
CALCIUM: 8.7 mg/dL — AB (ref 8.9–10.3)
CO2: 26 mmol/L (ref 22–32)
Chloride: 105 mmol/L (ref 101–111)
Creatinine, Ser: 1.16 mg/dL (ref 0.61–1.24)
GFR calc Af Amer: >60 mL/min (ref >60)
GLUCOSE: 168 mg/dL — AB (ref 65–99)
Potassium: 3.9 mmol/L (ref 3.5–5.1)
SODIUM: 140 mmol/L (ref 135–145)

## 2017-11-16 LAB — CBC
HCT: 40.2 % (ref 39.0–52.0)
Hemoglobin: 14.1 g/dL (ref 13.0–17.0)
MCH: 30.3 pg (ref 26.0–34.0)
MCHC: 35.1 g/dL (ref 30.0–36.0)
MCV: 86.5 fL (ref 78.0–100.0)
Platelets: 185 10*3/uL (ref 150–400)
RBC: 4.65 MIL/uL (ref 4.22–5.81)
RDW: 12.7 % (ref 11.5–15.5)
WBC: 7 10*3/uL (ref 4.0–10.5)

## 2017-11-16 LAB — GLUCOSE, CAPILLARY
GLUCOSE-CAPILLARY: 176 mg/dL — AB (ref 65–99)
GLUCOSE-CAPILLARY: 325 mg/dL — AB (ref 65–99)
Glucose-Capillary: 305 mg/dL — ABNORMAL HIGH (ref 65–99)
Glucose-Capillary: 249 mg/dL — ABNORMAL HIGH (ref 65–99)

## 2017-11-16 LAB — HIV ANTIBODY (ROUTINE TESTING W REFLEX): HIV SCREEN 4TH GENERATION: NONREACTIVE

## 2017-11-16 MED ORDER — LIVING WELL WITH DIABETES BOOK
Freq: Once | Status: AC
  Administered 2017-11-16: 12:00:00
  Filled 2017-11-16: qty 1

## 2017-11-16 MED ORDER — METFORMIN HCL 500 MG PO TABS
500.0000 mg | ORAL_TABLET | Freq: Every day | ORAL | Status: DC
  Administered 2017-11-16: 500 mg via ORAL
  Filled 2017-11-16: qty 1

## 2017-11-16 MED ORDER — METFORMIN HCL 500 MG PO TABS: 500.0000 mg | ORAL_TABLET | Freq: Two times a day (BID) | ORAL | 1 refills | Status: DC

## 2017-11-16 NOTE — Discharge Instructions (Signed)
Diabetes Mellitus and Nutrition When you have diabetes (diabetes mellitus), it is very important to have healthy eating habits because your blood sugar (glucose) levels are greatly affected by what you eat and drink. Eating healthy foods in the appropriate amounts, at about the same times every day, can help you:  Control your blood glucose.  Lower your risk of heart disease.  Improve your blood pressure.  Reach or maintain a healthy weight.  Every person with diabetes is different, and each person has different needs for a meal plan. Your health care provider may recommend that you work with a diet and nutrition specialist (dietitian) to make a meal plan that is best for you. Your meal plan may vary depending on factors such as:  The calories you need.  The medicines you take.  Your weight.  Your blood glucose, blood pressure, and cholesterol levels.  Your activity level.  Other health conditions you have, such as heart or kidney disease.  How do carbohydrates affect me? Carbohydrates affect your blood glucose level more than any other type of food. Eating carbohydrates naturally increases the amount of glucose in your blood. Carbohydrate counting is a method for keeping track of how many carbohydrates you eat. Counting carbohydrates is important to keep your blood glucose at a healthy level, especially if you use insulin or take certain oral diabetes medicines. It is important to know how many carbohydrates you can safely have in each meal. This is different for every person. Your dietitian can help you calculate how many carbohydrates you should have at each meal and for snack. Foods that contain carbohydrates include:  Bread, cereal, rice, pasta, and crackers.  Potatoes and corn.  Peas, beans, and lentils.  Milk and yogurt.  Fruit and juice.  Desserts, such as cakes, cookies, ice cream, and candy.  How does alcohol affect me? Alcohol can cause a sudden decrease in blood  glucose (hypoglycemia), especially if you use insulin or take certain oral diabetes medicines. Hypoglycemia can be a life-threatening condition. Symptoms of hypoglycemia (sleepiness, dizziness, and confusion) are similar to symptoms of having too much alcohol. If your health care provider says that alcohol is safe for you, follow these guidelines:  Limit alcohol intake to no more than 1 drink per day for nonpregnant women and 2 drinks per day for men. One drink equals 12 oz of beer, 5 oz of wine, or 1 oz of hard liquor.  Do not drink on an empty stomach.  Keep yourself hydrated with water, diet soda, or unsweetened iced tea.  Keep in mind that regular soda, juice, and other mixers may contain a lot of sugar and must be counted as carbohydrates.  What are tips for following this plan? Reading food labels  Start by checking the serving size on the label. The amount of calories, carbohydrates, fats, and other nutrients listed on the label are based on one serving of the food. Many foods contain more than one serving per package.  Check the total grams (g) of carbohydrates in one serving. You can calculate the number of servings of carbohydrates in one serving by dividing the total carbohydrates by 15. For example, if a food has 30 g of total carbohydrates, it would be equal to 2 servings of carbohydrates.  Check the number of grams (g) of saturated and trans fats in one serving. Choose foods that have low or no amount of these fats.  Check the number of milligrams (mg) of sodium in one serving. Most people   should limit total sodium intake to less than 2,300 mg per day.  Always check the nutrition information of foods labeled as "low-fat" or "nonfat". These foods may be higher in added sugar or refined carbohydrates and should be avoided.  Talk to your dietitian to identify your daily goals for nutrients listed on the label. Shopping  Avoid buying canned, premade, or processed foods. These  foods tend to be high in fat, sodium, and added sugar.  Shop around the outside edge of the grocery store. This includes fresh fruits and vegetables, bulk grains, fresh meats, and fresh dairy. Cooking  Use low-heat cooking methods, such as baking, instead of high-heat cooking methods like deep frying.  Cook using healthy oils, such as olive, canola, or sunflower oil.  Avoid cooking with butter, cream, or high-fat meats. Meal planning  Eat meals and snacks regularly, preferably at the same times every day. Avoid going long periods of time without eating.  Eat foods high in fiber, such as fresh fruits, vegetables, beans, and whole grains. Talk to your dietitian about how many servings of carbohydrates you can eat at each meal.  Eat 4-6 ounces of lean protein each day, such as lean meat, chicken, fish, eggs, or tofu. 1 ounce is equal to 1 ounce of meat, chicken, or fish, 1 egg, or 1/4 cup of tofu.  Eat some foods each day that contain healthy fats, such as avocado, nuts, seeds, and fish. Lifestyle   Check your blood glucose regularly.  Exercise at least 30 minutes 5 or more days each week, or as told by your health care provider.  Take medicines as told by your health care provider.  Do not use any products that contain nicotine or tobacco, such as cigarettes and e-cigarettes. If you need help quitting, ask your health care provider.  Work with a counselor or diabetes educator to identify strategies to manage stress and any emotional and social challenges. What are some questions to ask my health care provider?  Do I need to meet with a diabetes educator?  Do I need to meet with a dietitian?  What number can I call if I have questions?  When are the best times to check my blood glucose? Where to find more information:  American Diabetes Association: diabetes.org/food-and-fitness/food  Academy of Nutrition and Dietetics:  www.eatright.org/resources/health/diseases-and-conditions/diabetes  National Institute of Diabetes and Digestive and Kidney Diseases (NIH): www.niddk.nih.gov/health-information/diabetes/overview/diet-eating-physical-activity Summary  A healthy meal plan will help you control your blood glucose and maintain a healthy lifestyle.  Working with a diet and nutrition specialist (dietitian) can help you make a meal plan that is best for you.  Keep in mind that carbohydrates and alcohol have immediate effects on your blood glucose levels. It is important to count carbohydrates and to use alcohol carefully. This information is not intended to replace advice given to you by your health care provider. Make sure you discuss any questions you have with your health care provider. Document Released: 03/16/2005 Document Revised: 07/24/2016 Document Reviewed: 07/24/2016 Elsevier Interactive Patient Education  2018 Elsevier Inc. Diabetes and Foot Care Diabetes may cause you to have problems because of poor blood supply (circulation) to your feet and legs. This may cause the skin on your feet to become thinner, break easier, and heal more slowly. Your skin may become dry, and the skin may peel and crack. You may also have nerve damage in your legs and feet causing decreased feeling in them. You may not notice minor injuries to your feet that   could lead to infections or more serious problems. Taking care of your feet is one of the most important things you can do for yourself. Follow these instructions at home:  Wear shoes at all times, even in the house. Do not go barefoot. Bare feet are easily injured.  Check your feet daily for blisters, cuts, and redness. If you cannot see the bottom of your feet, use a mirror or ask someone for help.  Wash your feet with warm water (do not use hot water) and mild soap. Then pat your feet and the areas between your toes until they are completely dry. Do not soak your feet as  this can dry your skin.  Apply a moisturizing lotion or petroleum jelly (that does not contain alcohol and is unscented) to the skin on your feet and to dry, brittle toenails. Do not apply lotion between your toes.  Trim your toenails straight across. Do not dig under them or around the cuticle. File the edges of your nails with an emery board or nail file.  Do not cut corns or calluses or try to remove them with medicine.  Wear clean socks or stockings every day. Make sure they are not too tight. Do not wear knee-high stockings since they may decrease blood flow to your legs.  Wear shoes that fit properly and have enough cushioning. To break in new shoes, wear them for just a few hours a day. This prevents you from injuring your feet. Always look in your shoes before you put them on to be sure there are no objects inside.  Do not cross your legs. This may decrease the blood flow to your feet.  If you find a minor scrape, cut, or break in the skin on your feet, keep it and the skin around it clean and dry. These areas may be cleansed with mild soap and water. Do not cleanse the area with peroxide, alcohol, or iodine.  When you remove an adhesive bandage, be sure not to damage the skin around it.  If you have a wound, look at it several times a day to make sure it is healing.  Do not use heating pads or hot water bottles. They may burn your skin. If you have lost feeling in your feet or legs, you may not know it is happening until it is too late.  Make sure your health care provider performs a complete foot exam at least annually or more often if you have foot problems. Report any cuts, sores, or bruises to your health care provider immediately. Contact a health care provider if:  You have an injury that is not healing.  You have cuts or breaks in the skin.  You have an ingrown nail.  You notice redness on your legs or feet.  You feel burning or tingling in your legs or feet.  You  have pain or cramps in your legs and feet.  Your legs or feet are numb.  Your feet always feel cold. Get help right away if:  There is increasing redness, swelling, or pain in or around a wound.  There is a red line that goes up your leg.  Pus is coming from a wound.  You develop a fever or as directed by your health care provider.  You notice a bad smell coming from an ulcer or wound. This information is not intended to replace advice given to you by your health care provider. Make sure you discuss any questions you have   with your health care provider. Document Released: 06/16/2000 Document Revised: 11/25/2015 Document Reviewed: 11/26/2012 Elsevier Interactive Patient Education  2017 Elsevier Inc. Diabetes Mellitus and Exercise Exercising regularly is important for your overall health, especially when you have diabetes (diabetes mellitus). Exercising is not only about losing weight. It has many health benefits, such as increasing muscle strength and bone density and reducing body fat and stress. This leads to improved fitness, flexibility, and endurance, all of which result in better overall health. Exercise has additional benefits for people with diabetes, including:  Reducing appetite.  Helping to lower and control blood glucose.  Lowering blood pressure.  Helping to control amounts of fatty substances (lipids) in the blood, such as cholesterol and triglycerides.  Helping the body to respond better to insulin (improving insulin sensitivity).  Reducing how much insulin the body needs.  Decreasing the risk for heart disease by: ? Lowering cholesterol and triglyceride levels. ? Increasing the levels of good cholesterol. ? Lowering blood glucose levels.  What is my activity plan? Your health care provider or certified diabetes educator can help you make a plan for the type and frequency of exercise (activity plan) that works for you. Make sure that you:  Do at least 150  minutes of moderate-intensity or vigorous-intensity exercise each week. This could be brisk walking, biking, or water aerobics. ? Do stretching and strength exercises, such as yoga or weightlifting, at least 2 times a week. ? Spread out your activity over at least 3 days of the week.  Get some form of physical activity every day. ? Do not go more than 2 days in a row without some kind of physical activity. ? Avoid being inactive for more than 90 minutes at a time. Take frequent breaks to walk or stretch.  Choose a type of exercise or activity that you enjoy, and set realistic goals.  Start slowly, and gradually increase the intensity of your exercise over time.  What do I need to know about managing my diabetes?  Check your blood glucose before and after exercising. ? If your blood glucose is higher than 240 mg/dL (13.3 mmol/L) before you exercise, check your urine for ketones. If you have ketones in your urine, do not exercise until your blood glucose returns to normal.  Know the symptoms of low blood glucose (hypoglycemia) and how to treat it. Your risk for hypoglycemia increases during and after exercise. Common symptoms of hypoglycemia can include: ? Hunger. ? Anxiety. ? Sweating and feeling clammy. ? Confusion. ? Dizziness or feeling light-headed. ? Increased heart rate or palpitations. ? Blurry vision. ? Tingling or numbness around the mouth, lips, or tongue. ? Tremors or shakes. ? Irritability.  Keep a rapid-acting carbohydrate snack available before, during, and after exercise to help prevent or treat hypoglycemia.  Avoid injecting insulin into areas of the body that are going to be exercised. For example, avoid injecting insulin into: ? The arms, when playing tennis. ? The legs, when jogging.  Keep records of your exercise habits. Doing this can help you and your health care provider adjust your diabetes management plan as needed. Write down: ? Food that you eat before  and after you exercise. ? Blood glucose levels before and after you exercise. ? The type and amount of exercise you have done. ? When your insulin is expected to peak, if you use insulin. Avoid exercising at times when your insulin is peaking.  When you start a new exercise or activity, work with your health   care provider to make sure the activity is safe for you, and to adjust your insulin, medicines, or food intake as needed.  Drink plenty of water while you exercise to prevent dehydration or heat stroke. Drink enough fluid to keep your urine clear or pale yellow. This information is not intended to replace advice given to you by your health care provider. Make sure you discuss any questions you have with your health care provider. Document Released: 09/09/2003 Document Revised: 01/07/2016 Document Reviewed: 11/29/2015 Elsevier Interactive Patient Education  2018 Elsevier Inc.  

## 2017-11-16 NOTE — Discharge Summary (Addendum)
Physician Discharge Summary  Mark Schneider ZOX:[id number] DOB: 1981-03-12 DOA: 11/15/2017  PCP: Patient, No Pcp Per  Admit date: 11/15/2017 Discharge date: 11/16/2017  Time spent: 45 minutes  Recommendations for Outpatient Follow-up:  Patient will be discharged to home.  Patient will need to follow up with primary care provider within one week of discharge.  Patient should continue medications as prescribed.  Patient should follow a carb modified diet.   Discharge Diagnoses:  New onset diabetes mellitus, type II Acute kidney injury Morbid obesity  Discharge Condition: Stable  Diet recommendation: Carb modified  Filed Weights   11/15/17 1526 11/15/17 1541  Weight: (!) 172.4 kg (380 lb) (!) 172.4 kg (380 lb)    History of present illness:  On 11/15/2017 by Dr. Jonah Blue Vicent A Schneider is a 37 y.o. male with no significant past medical history other than morbid obesity (BMP 55) presenting with symptomatic new-onset DM.  For the past 1-2 weeks, he has been having polyuria and not sleeping well, very tired and sluggish.  Poor appetite recently.  The one night he did eat a lot, he had a lot of GI upset.  Nausea without vomiting, abdominal pain.  +polyuria.  He drank 4 cups on water in the ER so far.  UOP maybe 10 times daily, 4-5 times of nocturia.    Hospital Course:  New onset diabetes mellitus, type II -Presented with blood sugars over 600, polydipsia and polyuria -Patient had normal CO2 along with anion gap -Was not placed on insulin drip but given Lantus as well as sliding scale.  CBGs did improve -Hemoglobin A1c 10.6 -Discussed diet education with patient, he does intake lots of sugary drinks, juices.  Feel that patient can be discharged on metformin with diet modifications and exercise, close follow-up with PCP. -Started patient on metformin 500 mg twice daily, instructed to take with meals -Diabetes coordinator also consulted -Patient would also benefit from  nutrition consult and monitoring as an outpatient  Acute kidney injury -Upon admission, creatinine was 1.84 -Placed on IV fluids, creatinine currently down to 1.16 -Given the patient be placed on metformin, would repeat BMP in 1 week  Morbid obesity -BMI greater than 54 -Discussed lifestyle modifications including diet as well as exercise -Encouraged patient to follow with nutrition as an outpatient  Procedures: None  Consultations: None  Discharge Exam: Vitals:   11/15/17 2126 11/16/17 0429  BP: (!) 140/94 116/67  Pulse: 89 86  Resp: 20 18  Temp: (!) 97.5 F (36.4 C) 97.7 F (36.5 C)  SpO2: 100% 100%   She states he is feeling much better.  Was only up once last night feeling that he had to urinate.  Denies current chest pain, shortness breath, abdominal pain, nausea, vomiting, diarrhea, constipation, dizziness or headache.   General: Well developed, well nourished, NAD, appears stated age  HEENT: NCAT, mucous membranes moist.  Neck: Supple  Cardiovascular: S1 S2 auscultated, no rubs, murmurs or gallops. Regular rate and rhythm.  Respiratory: Clear to auscultation bilaterally with equal chest rise  Abdomen: Soft, obese nontender, nondistended, + bowel sounds  Extremities: warm dry without cyanosis clubbing or edema  Neuro: AAOx3, nonfocal  Psych: Normal affect and demeanor with intact judgement and insight, pleasant  Discharge Instructions Discharge Instructions    Ambulatory referral to Nutrition and Diabetic Education   Complete by:  As directed    Discharge instructions   Complete by:  As directed    Patient will be discharged to home.  Patient will  need to follow up with primary care provider within one week of discharge.  Patient should continue medications as prescribed.  Patient should follow a carb modified diet.     Allergies as of 11/16/2017      Reactions   Sheep-derived Products Swelling   Shellfish Allergy       Medication List    STOP  taking these medications   ibuprofen 200 MG tablet Commonly known as:  ADVIL,MOTRIN     TAKE these medications   metFORMIN 500 MG tablet Commonly known as:  GLUCOPHAGE Take 1 tablet (500 mg total) by mouth 2 (two) times daily with a meal.      Allergies  Allergen Reactions  . Sheep-Derived Products Swelling  . Shellfish Allergy    Follow-up Information    [facility]. Call.   Why:  CAll upoin discharge to make a hospital f/u appt and to obtain a pcp Contact information: [address] [place] [address] [phone]       Primary care physician. Schedule an appointment as soon as possible for a visit in 1 week(s).   Why:  Hospital follow up           The results of significant diagnostics from this hospitalization (including imaging, microbiology, ancillary and laboratory) are listed below for reference.    Significant Diagnostic Studies: No results found.  Microbiology: No results found for this or any previous visit (from the past 240 hour(s)).   Labs: Basic Metabolic Panel: Recent Labs  Lab 11/15/17 1550 11/16/17 0607  NA 132* 140  K 4.5 3.9  CL 94* 105  CO2 24 26  GLUCOSE 755* 168*  BUN 19 15  CREATININE 1.84* 1.16  CALCIUM 9.6 8.7*   Liver Function Tests: No results for input(s): AST, ALT, ALKPHOS, BILITOT, PROT, ALBUMIN in the last 168 hours. No results for input(s): LIPASE, AMYLASE in the last 168 hours. No results for input(s): AMMONIA in the last 168 hours. CBC: Recent Labs  Lab 11/15/17 1550 11/16/17 0607  WBC 7.0 7.0  HGB 15.2 14.1  HCT 42.8 40.2  MCV 86.8 86.5  PLT 204 185   Cardiac Enzymes: No results for input(s): CKTOTAL, CKMB, CKMBINDEX, TROPONINI in the last 168 hours. BNP: BNP (last 3 results) No results for input(s): BNP in the last 8760 hours.  ProBNP (last 3 results) No results for input(s): PROBNP in the last 8760 hours.  CBG: Recent Labs  Lab 11/15/17 2133  11/15/17 2357 11/16/17 0427 11/16/17 0846 11/16/17 1132  GLUCAP 569* 431* 176* 325* 305*       Signed:  Umer Schneider  [facility] 11/16/2017, 2:30 PM

## 2017-11-16 NOTE — Progress Notes (Signed)
Jacobb A Mantel to be D/C'd Home per MD order.  Discussed prescriptions and follow up appointments with the patient. Prescriptions given to patient, medication list explained in detail. Living Well with Diabetes book given to patient. Pt verbalized understanding.  Allergies as of 11/16/2017      Reactions   Sheep-derived Products Swelling   Shellfish Allergy       Medication List    STOP taking these medications   ibuprofen 200 MG tablet Commonly known as:  ADVIL,MOTRIN     TAKE these medications   metFORMIN 500 MG tablet Commonly known as:  GLUCOPHAGE Take 1 tablet (500 mg total) by mouth 2 (two) times daily with a meal.       Vitals:   11/15/17 2126 11/16/17 0429  BP: (!) 140/94 116/67  Pulse: 89 86  Resp: 20 18  Temp: (!) 97.5 F (36.4 C) 97.7 F (36.5 C)  SpO2: 100% 100%    Skin clean, dry and intact without evidence of skin break down, no evidence of skin tears noted. IV catheter discontinued intact. Site without signs and symptoms of complications. Dressing and pressure applied. Pt denies pain at this time. No complaints noted.  An After Visit Summary was printed and given to the patient. Patient escorted via WC, and D/C home via private auto.  Rondel Jumbo 11/16/2017 4:25 PM

## 2017-11-16 NOTE — Progress Notes (Signed)
Inpatient Diabetes Program Recommendations  AACE/ADA: New Consensus Statement on Inpatient Glycemic Control (2015)  Target Ranges:  Prepandial:   less than 140 mg/dL      Peak postprandial:   less than 180 mg/dL (1-2 hours)      Critically ill patients:  140 - 180 mg/dL   Lab Results  Component Value Date   GLUCAP 325 (H) 11/16/2017   HGBA1C 10.5 (H) 11/15/2017    Review of Glycemic Control  Diabetes history: None Outpatient Diabetes medications: N/A Current orders for Inpatient glycemic control: Lantus 10 units QHS, metformin 500 QAM, Novolog 0-20 units Q4H.  New onset diabetes with HgbA1C of 10.5%. Pt is new [employer] employee. Will be able to enroll in Wellness program.  Inpatient Diabetes Program Recommendations:     Discharge: metformin 500 mg bid Healthy diet and daily exercise.  Will order OP Diabetes Education Needs to obtain [facility] PCP to manage his DM2.  Spoke with pt at length regarding new diagnosis, importance of monitoring blood sugars and taking logbook to PCP visit for any needed adjustment. Discussed diet (eliminating regular tea, sodas, and juices), and cutting portion sizes in half. Instructed on how exercise will help his body to be less resistant to insulin. Will send Living Well with Diabetes book and order OP Diabetes Education. Will enroll in Wellness program here at [facility].  Will continue to follow.  Will need prescription for meter and supplies at discharge.  Thank you. Ailene Ards, RD, LDN, CDE Inpatient Diabetes Coordinator [phone]

## 2017-11-23 MED FILL — glipiZIDE XL 5 MG TB24: 5 | 30 days supply | Qty: 30 | Fill #0

## 2017-11-23 MED FILL — METFORMIN HCL ER 500 MG TAB: 500 | 30 days supply | Qty: 60 | Fill #0

## 2017-11-23 MED FILL — OMEPRAZOLE 20 MG CAP: 20 | 30 days supply | Qty: 30 | Fill #0

## 2017-12-06 ENCOUNTER — Encounter: Payer: Self-pay | Attending: Family Medicine | Admitting: Dietician

## 2017-12-06 ENCOUNTER — Encounter: Payer: Self-pay | Admitting: Dietician

## 2017-12-06 DIAGNOSIS — E119 Type 2 diabetes mellitus without complications: Secondary | ICD-10-CM | POA: Insufficient documentation

## 2017-12-06 DIAGNOSIS — Z713 Dietary counseling and surveillance: Secondary | ICD-10-CM | POA: Insufficient documentation

## 2017-12-06 NOTE — Progress Notes (Signed)
Patient was seen on 12/06/17 for the first of a series of three diabetes self-management courses at the [facility].  Patient Education Plan per assessed needs and concerns is to attend three course education program for Diabetes Self Management Education.  The following learning objectives were met by the patient during this class:  Describe diabetes  State some common risk factors for diabetes  Defines the role of glucose and insulin  Identifies type of diabetes and pathophysiology  Describe the relationship between diabetes and cardiovascular risk  State the members of the Healthcare Team  States the rationale for glucose monitoring  State when to test glucose  State their individual Target Range  State the importance of logging glucose readings  Describe how to interpret glucose readings  Identifies A1C target  Explain the correlation between A1c and eAG values  State symptoms and treatment of high blood glucose  State symptoms and treatment of low blood glucose  Explain proper technique for glucose testing  Identifies proper sharps disposal  Handouts given during class include:  ADA Diabetes You Take Control   Carb Counting and Meal Planning book  Meal Plan Card  Meal planning worksheet  Low Sodium Flavoring Tips  Types of Fats  The diabetes portion plate  E7O to eAG Conversion Chart  Diabetes Recommended Care Schedule  Support Group  Diabetes Success Plan  Core Class Satisfaction Survey   Follow-Up Plan:  Attend core 2

## 2017-12-13 ENCOUNTER — Encounter: Payer: Self-pay | Admitting: Dietician

## 2017-12-13 DIAGNOSIS — E119 Type 2 diabetes mellitus without complications: Secondary | ICD-10-CM

## 2017-12-13 NOTE — Progress Notes (Signed)
Patient was seen on 12/13/17 for the second of a series of three diabetes self-management courses at the [facility]. The following learning objectives were met by the patient during this class:   Describe the role of different macronutrients on glucose  Explain how carbohydrates affect blood glucose  State what foods contain the most carbohydrates  Demonstrate carbohydrate counting  Demonstrate how to read Nutrition Facts food label  Describe effects of various fats on heart health  Describe the importance of good nutrition for health and healthy eating strategies  Describe techniques for managing your shopping, cooking and meal planning  List strategies to follow meal plan when dining out  Describe the effects of alcohol on glucose and how to use it safely  Goals:  Follow Diabetes Meal Plan as instructed  Aim to spread carbs evenly throughout the day  Aim for 3 meals per day and snacks as needed Include lean protein foods to meals/snacks  Monitor glucose levels as instructed by your doctor   Follow-Up Plan:  Attend Core 3  Work towards following your personal food plan.   

## 2017-12-20 ENCOUNTER — Encounter: Payer: Self-pay | Admitting: Dietician

## 2017-12-20 DIAGNOSIS — E119 Type 2 diabetes mellitus without complications: Secondary | ICD-10-CM

## 2017-12-20 NOTE — Progress Notes (Signed)
Patient was seen on 12/20/17 for the third of a series of three diabetes self-management courses at the [facility].   Mark Schneider. State the amount of activity recommended for healthy living . Describe activities suitable for individual needs . Identify ways to regularly incorporate activity into daily life . Identify barriers to activity and ways to over come these barriers  Identify diabetes medications being personally used and their primary action for lowering glucose and possible side effects . Describe role of stress on blood glucose and develop strategies to address psychosocial issues . Identify diabetes complications and ways to prevent them  Explain how to manage diabetes during illness . Evaluate success in meeting personal goal . Establish 2-3 goals that they will plan to diligently work on  Goals:   I will count my carb choices at most meals and snacks  I will be active 30 minutes or more 4 times a week  I will take my diabetes medications as scheduled  Your patient has identified these potential barriers to change:  Stress  Your patient has identified their diabetes self-care support plan as  Family Support  Plan:  Attend Support Group as desired

## 2017-12-26 MED FILL — glipiZIDE XL 5 MG TB24: 5 | 30 days supply | Qty: 30 | Fill #1

## 2017-12-26 MED FILL — OMEPRAZOLE 20 MG CAP: 20 | 30 days supply | Qty: 30 | Fill #1

## 2018-01-21 MED FILL — METFORMIN HCL ER 500 MG TAB: 500 | 90 days supply | Qty: 180 | Fill #0

## 2018-01-21 MED FILL — glipiZIDE XL 5 MG TB24: 5 | 90 days supply | Qty: 90 | Fill #0

## 2018-01-21 MED FILL — OLMESARTAN MEDOXOMIL 20 MG: 20 | 30 days supply | Qty: 30 | Fill #0

## 2018-08-27 ENCOUNTER — Emergency Department (HOSPITAL_COMMUNITY): Payer: Self-pay

## 2018-08-27 ENCOUNTER — Encounter (HOSPITAL_COMMUNITY): Payer: Self-pay

## 2018-08-27 ENCOUNTER — Emergency Department (HOSPITAL_COMMUNITY)
Admission: EM | Admit: 2018-08-27 | Discharge: 2018-08-27 | Disposition: A | Discharge status: Home / Self Care | Payer: Self-pay | Attending: Emergency Medicine | Admitting: Emergency Medicine

## 2018-08-27 DIAGNOSIS — E119 Type 2 diabetes mellitus without complications: Secondary | ICD-10-CM | POA: Insufficient documentation

## 2018-08-27 DIAGNOSIS — Z760 Encounter for issue of repeat prescription: Secondary | ICD-10-CM | POA: Insufficient documentation

## 2018-08-27 DIAGNOSIS — R600 Localized edema: Secondary | ICD-10-CM | POA: Insufficient documentation

## 2018-08-27 DIAGNOSIS — I10 Essential (primary) hypertension: Secondary | ICD-10-CM | POA: Insufficient documentation

## 2018-08-27 DIAGNOSIS — L03031 Cellulitis of right toe: Secondary | ICD-10-CM | POA: Insufficient documentation

## 2018-08-27 DIAGNOSIS — Z7984 Long term (current) use of oral hypoglycemic drugs: Secondary | ICD-10-CM | POA: Insufficient documentation

## 2018-08-27 MED ORDER — OLMESARTAN MEDOXOMIL 20 MG PO TABS: 20.0000 mg | ORAL_TABLET | Freq: Every day | ORAL | 1 refills | Status: DC

## 2018-08-27 MED ORDER — METFORMIN HCL 500 MG PO TABS: 500.0000 mg | ORAL_TABLET | Freq: Two times a day (BID) | ORAL | 1 refills | Status: DC

## 2018-08-27 MED ORDER — CEPHALEXIN 500 MG PO CAPS: 500.0000 mg | ORAL_CAPSULE | Freq: Four times a day (QID) | ORAL | 0 refills | Status: DC

## 2018-08-27 MED ORDER — GLIPIZIDE 5 MG PO TABS: 5.0000 mg | ORAL_TABLET | Freq: Every day | ORAL | 0 refills | Status: DC

## 2018-08-27 NOTE — ED Triage Notes (Addendum)
Patient c/o of right foot/big toe swelling for a "few days now"  Patient recently started working at a new job that requires him to be on hit feet all day.   Hx. Cellulitis.   Good pulses in right foot. Slight swelling to foot. Indention subsides rapidly.  A/Ox4 Ambulatory in triage Girlfriend with patient.

## 2018-08-27 NOTE — ED Provider Notes (Signed)
[facility]-EMERGENCY DEPT Provider Note   CSN: [id number] Arrival date & time: 08/27/18  8502    History   Chief Complaint Chief Complaint  Patient presents with  . Foot Swelling    HPI Mark Schneider is a 38 y.o. male with hx of HTN and DMwho presents to the ED with right great toe pain and swelling. The symptoms started 3 days ago. Patient reports about 2 years ago having cellulitis in the same toe. Patient reports he has not taken his BP medication today because he ran out of his medications. Last dose was over 2 weeks ago. Patient reports feet and ankles have been swelling since off BP medication. Patient request medication refill.     The history is provided by the patient. No language interpreter was used.    Past Medical History:  Diagnosis Date  . Diabetes mellitus without complication [facility])     Patient Active Problem List   Diagnosis Date Noted  . New onset type 2 diabetes mellitus (HCC) 11/15/2017  . Acute renal failure (ARF) (HCC) 11/15/2017  . Morbid obesity with BMI of 50.0-59.9, adult (HCC) 11/15/2017    Past Surgical History:  Procedure Laterality Date  . DENTAL SURGERY    . TOE SURGERY          Home Medications    Prior to Admission medications   Medication Sig Start Date End Date Taking? Authorizing Provider  ibuprofen (ADVIL,MOTRIN) 200 MG tablet Take 400 mg by mouth 2 (two) times daily as needed for moderate pain.   Yes [provider]  cephALEXin (KEFLEX) 500 MG capsule Take 1 capsule (500 mg total) by mouth 4 (four) times daily. 08/27/18   Janne Napoleon, NP  glipiZIDE (GLUCOTROL) 5 MG tablet Take 1 tablet (5 mg total) by mouth daily before breakfast. 08/27/18   Janne Napoleon, NP  metFORMIN (GLUCOPHAGE) 500 MG tablet Take 1 tablet (500 mg total) by mouth 2 (two) times daily with a meal. 08/27/18   Damian Leavell, [place], NP  olmesartan (BENICAR) 20 MG tablet Take 1 tablet (20 mg total) by mouth daily. 08/27/18   Janne Napoleon, NP    Family History Family History  Problem Relation Age of Onset  . Diabetes Mother   . Diabetes Sister   . Diabetes Maternal Grandmother   . Diabetes Maternal Uncle     Social History Social History   Tobacco Use  . Smoking status: Never Smoker  . Smokeless tobacco: Never Used  Substance Use Topics  . Alcohol use: No  . Drug use: No     Allergies   Sheep-derived products and Shellfish allergy   Review of Systems Review of Systems  Constitutional: Negative for chills and fever.  HENT: Negative.   Eyes: Negative for visual disturbance.  Respiratory: Negative for cough and shortness of breath.   Cardiovascular: Negative for chest pain.  Gastrointestinal: Negative for abdominal pain, nausea and vomiting.  Genitourinary: Negative for dysuria and urgency.  Musculoskeletal: Positive for arthralgias. Negative for back pain.       Right great toe  Skin: Negative for rash.  Neurological: Negative for headaches.  Psychiatric/Behavioral: Negative for confusion.     Physical Exam Updated Vital Signs BP (!) 162/105   Pulse 83   Temp 98.5 F (36.9 C) (Oral)   Resp 18   SpO2 96%   Physical Exam Vitals signs and nursing note reviewed.  Constitutional:      Appearance: He is well-developed.  Comments: Morbidly obese  HENT:     Head: Normocephalic.     Mouth/Throat:     Mouth: Mucous membranes are moist.  Eyes:     Extraocular Movements: Extraocular movements intact.     Conjunctiva/sclera: Conjunctivae normal.  Neck:     Musculoskeletal: Neck supple.  Cardiovascular:     Rate and Rhythm: Normal rate.  Pulmonary:     Effort: Pulmonary effort is normal.  Musculoskeletal:     Right lower leg: Edema present.     Left lower leg: Edema present.       Feet:     Comments: Right great toe with swelling and tenderness. Mild erythema, no red streaking, slight increased warmth. Pedal pulse 2+.  Skin:    General: Skin is warm and dry.  Neurological:      Mental Status: He is alert and oriented to person, place, and time.     Cranial Nerves: No cranial nerve deficit.  Psychiatric:        Mood and Affect: Mood normal.      ED Treatments / Results  Labs (all labs ordered are listed, but only abnormal results are displayed) Labs Reviewed - No data to display  Radiology Dg Toe Great Right  Result Date: 08/27/2018 CLINICAL DATA:  Toe pain, no known injury, initial encounter EXAM: RIGHT GREAT TOE COMPARISON:  None. FINDINGS: Mild soft tissue swelling is noted. No acute fracture or dislocation is seen. No foreign body is noted. IMPRESSION: Soft tissue swelling without acute bony abnormality. Electronically Signed   By: Alcide Clever M.D.   On: 08/27/2018 14:08    Procedures Procedures (including critical care time)  Medications Ordered in ED Medications - No data to display   Initial Impression / Assessment and Plan / ED Course  I have reviewed the triage vital signs and the nursing notes. 38 y.o. morbidly obese male here with right great toe pain and swelling stable for d/c without fracture or osteo noted on x-ray. Will treat with antibiotics for early cellulitis. Will also refill patient's medications. Encouraged patient to f/u with PCP. Return precautions discussed.   Final Clinical Impressions(s) / ED Diagnoses   Final diagnoses:  Cellulitis of great toe, right  Medication refill  Lower leg edema  Hypertension, unspecified type    ED Discharge Orders         Ordered    olmesartan (BENICAR) 20 MG tablet  Daily     08/27/18 1458    metFORMIN (GLUCOPHAGE) 500 MG tablet  2 times daily with meals     08/27/18 1458    cephALEXin (KEFLEX) 500 MG capsule  4 times daily     08/27/18 1458    glipiZIDE (GLUCOTROL) 5 MG tablet  Daily before breakfast     08/27/18 1514           Damian Leavell [place], NP 08/27/18 2210    Terrilee Files, MD 08/28/18 [id number]

## 2018-08-27 NOTE — Discharge Instructions (Addendum)
It is very important that you have follow up with a primary care doctor to regulate your medications for your high blood pressure and your diabetes. And also to follow you with blood work to be sure your kidney, heart, lungs and other systems are not being adversely affected by your BP and diabetes. If you develop chest pain or shortness of breath, fever, chills, feeling dizzy or other problems, return immediately.

## 2018-12-06 ENCOUNTER — Other Ambulatory Visit: Payer: Self-pay

## 2018-12-06 ENCOUNTER — Emergency Department (HOSPITAL_COMMUNITY): Payer: Self-pay

## 2018-12-06 ENCOUNTER — Emergency Department (HOSPITAL_COMMUNITY)
Admission: EM | Admit: 2018-12-06 | Discharge: 2018-12-06 | Disposition: A | Discharge status: Home / Self Care | Payer: Self-pay | Attending: Emergency Medicine | Admitting: Emergency Medicine

## 2018-12-06 DIAGNOSIS — U071 COVID-19: Secondary | ICD-10-CM

## 2018-12-06 DIAGNOSIS — Z7984 Long term (current) use of oral hypoglycemic drugs: Secondary | ICD-10-CM | POA: Insufficient documentation

## 2018-12-06 DIAGNOSIS — Z79899 Other long term (current) drug therapy: Secondary | ICD-10-CM | POA: Insufficient documentation

## 2018-12-06 DIAGNOSIS — E119 Type 2 diabetes mellitus without complications: Secondary | ICD-10-CM | POA: Insufficient documentation

## 2018-12-06 NOTE — ED Provider Notes (Signed)
[facility]-EMERGENCY DEPT Provider Note   CSN: [id number] Arrival date & time: 12/06/18  1057    History   Chief Complaint Chief Complaint  Patient presents with  . Covid +  . Shortness of Breath    HPI Mark Schneider is a 38 y.o. male.     The history is provided by the patient and medical records. No language interpreter was used.  Shortness of Breath  Associated symptoms: fever (None today.)   Associated symptoms: no abdominal pain, no chest pain, no vomiting and no wheezing    Mark Schneider is a 38 y.o. male  with a PMH of DM who presents to the Emergency Department complaining of shortness of breath this morning.  Patient was seen on 5/31 at [facility] where he tested positive for coronavirus.  He states that he has been having fevers for several days now and overall not feeling well.  This morning was the first time he woke up and did not have a fever.  Overall, he has felt as if he is getting better.  Yesterday, he reported feeling best that he had in several days.  He went to take a warm shower and suddenly felt very short of breath.  For several minutes, he felt as if he could not breathe.  He called EMS and was transported to emergency department.  Over the last hour, he feels as if his breathing has slowly been improving.  Now states he feels about 75% better, but still not quite back to his baseline.  He denies any associated chest pain.  Currently, states he does not feel short of breath, but his breathing does feel a little different than usual.  He has been tolerating p.o. fine, but states that he has not had much of an appetite.  He just does not feel like eating.  Has not had any abdominal pain or vomiting.  Had diarrhea early and symptom onset, but none in the last day or two.   Past Medical History:  Diagnosis Date  . Diabetes mellitus without complication [facility](HCC)     Patient Active Problem List   Diagnosis Date Noted  . New onset  type 2 diabetes mellitus (HCC) 11/15/2017  . Acute renal failure (ARF) (HCC) 11/15/2017  . Morbid obesity with BMI of 50.0-59.9, adult (HCC) 11/15/2017    Past Surgical History:  Procedure Laterality Date  . DENTAL SURGERY    . TOE SURGERY          Home Medications    Prior to Admission medications   Medication Sig Start Date End Date Taking? Authorizing Provider  acetaminophen (TYLENOL) 500 MG tablet Take 500 mg by mouth every 6 (six) hours as needed for mild pain.   Yes [provider]  glipiZIDE (GLUCOTROL) 5 MG tablet Take 1 tablet (5 mg total) by mouth daily before breakfast. 08/27/18  Yes Damian LeavellNeese, [place] M, NP  metFORMIN (GLUCOPHAGE) 500 MG tablet Take 1 tablet (500 mg total) by mouth 2 (two) times daily with a meal. 08/27/18  Yes Neese, [place] M, NP  olmesartan (BENICAR) 20 MG tablet Take 1 tablet (20 mg total) by mouth daily. 08/27/18   Janne NapoleonNeese, [place] M, NP    Family History Family History  Problem Relation Age of Onset  . Diabetes Mother   . Diabetes Sister   . Diabetes Maternal Grandmother   . Diabetes Maternal Uncle     Social History Social History   Tobacco Use  .  Smoking status: Never Smoker  . Smokeless tobacco: Never Used  Substance Use Topics  . Alcohol use: No  . Drug use: No     Allergies   Sheep-derived products and Shellfish allergy   Review of Systems Review of Systems  Constitutional: Positive for appetite change and fever (None today.).  Respiratory: Positive for shortness of breath. Negative for wheezing.   Cardiovascular: Negative for chest pain.  Gastrointestinal: Positive for diarrhea (Several days ago, now resolved.). Negative for abdominal pain, constipation, nausea and vomiting.  All other systems reviewed and are negative.    Physical Exam Updated Vital Signs BP (!) 159/94 (BP Location: Right Arm)   Pulse 95   Temp 98.9 F (37.2 C) (Oral)   Resp 20   SpO2 98%   Physical Exam Vitals signs and nursing note reviewed.   Constitutional:      General: He is not in acute distress.    Appearance: He is well-developed.     Comments: Nontoxic-appearing.  HENT:     Head: Normocephalic and atraumatic.  Neck:     Musculoskeletal: Neck supple.  Cardiovascular:     Rate and Rhythm: Regular rhythm.     Heart sounds: Normal heart sounds. No murmur.  Pulmonary:     Effort: Pulmonary effort is normal. No respiratory distress.     Breath sounds: Normal breath sounds.     Comments: Speaking in full sentences without any difficulty. Abdominal:     General: There is no distension.     Palpations: Abdomen is soft.     Tenderness: There is no abdominal tenderness.  Skin:    General: Skin is warm and dry.  Neurological:     Mental Status: He is alert and oriented to person, place, and time.      ED Treatments / Results  Labs (all labs ordered are listed, but only abnormal results are displayed) Labs Reviewed - No data to display  EKG EKG Interpretation  Date/Time:  Friday December 06 2018 11:08:45 EDT Ventricular Rate:  101 PR Interval:    QRS Duration: 102 QT Interval:  353 QTC Calculation: 458 R Axis:   108 Text Interpretation:  Sinus tachycardia Right axis deviation Confirmed by Benjiman Core [phone]) on 12/06/2018 12:03:40 PM   Radiology Dg Chest Portable 1 View  Result Date: 12/06/2018 CLINICAL DATA:  Shortness of breath.  Diabetes.  COVID-19 exposure. EXAM: PORTABLE CHEST 1 VIEW COMPARISON:  No prior. FINDINGS: Cardiomegaly. No pulmonary venous congestion. Low lung volumes with mild basilar atelectasis. No focal alveolar infiltrate. No pleural effusion or pneumothorax. IMPRESSION: 1.  Cardiomegaly.  No pulmonary venous congestion. 2.  Low lung volumes.  No focal alveolar infiltrate. Electronically Signed   By: Maisie Fus  Register   On: 12/06/2018 12:20    Procedures Procedures (including critical care time)  Medications Ordered in ED Medications - No data to display   Initial Impression /  Assessment and Plan / ED Course  I have reviewed the triage vital signs and the nursing notes.  Pertinent labs & imaging results that were available during my care of the patient were reviewed by me and considered in my medical decision making (see chart for details).       Mark Schneider is a 38 y.o. male who presents to ED for an episode of shortness of breath which occurred this morning.   Chart reviewed.  Patient tested positive for covid on 5/31.  Patient states that today was actually his first day afebrile.  Afebrile  and hemodynamically stable in the emergency department today.  He is speaking in full sentences and appears in no acute distress.  He is nontoxic-appearing and actually looks quite well.  Lungs are clear.  Oxygenation of 95-97% on room air while speaking with me.  He tells me that he became short of breath while in the shower.  Lasted several minutes.  Now feels much better.  Chest x-ray without acute findings.  On reevaluation, feels as if his breathing is back to normal.  Observed in the emergency department nearly 2 hours without acute event and as stated, improvement of dyspnea. Evaluation does not show pathology that would require ongoing emergent intervention or inpatient treatment.  We spoke about reasons to return to the emergency department at length. PCP follow up encouraged.  All questions answered.    Patient discussed with Dr. Rubin Payor who agrees with treatment plan.    Mark Schneider was evaluated in Emergency Department on 12/06/2018 for the symptoms described in the history of present illness. He was evaluated in the context of the global COVID-19 pandemic, which necessitated consideration that the patient might be at risk for infection with the SARS-CoV-2 virus that causes COVID-19. Institutional protocols and algorithms that pertain to the evaluation of patients at risk for COVID-19 are in a state of rapid change based on information released by  regulatory bodies including the CDC and federal and state organizations. These policies and algorithms were followed during the patient's care in the ED.   Final Clinical Impressions(s) / ED Diagnoses   Final diagnoses:  COVID-19    ED Discharge Orders    None       Sharlie Shreffler, [place], PA-C 12/06/18 1301    Benjiman Core, MD 12/06/18 1511

## 2018-12-06 NOTE — Discharge Instructions (Signed)
It was my pleasure taking care of you today!   Fortunately, your x-ray today looked good.   You should return to the ER should you feel like it is hard to breath

## 2018-12-06 NOTE — ED Notes (Signed)
Bed: WA18 Expected date:  Expected time:  Means of arrival:  Comments: Negative Pressure 

## 2018-12-06 NOTE — ED Triage Notes (Signed)
Pt reports his significant other tested positive for COVID and he was tested and reported to be positive as well. Pt reports increasing SOB.

## 2018-12-07 ENCOUNTER — Other Ambulatory Visit: Payer: Self-pay

## 2018-12-07 ENCOUNTER — Encounter (HOSPITAL_COMMUNITY): Payer: Self-pay | Admitting: Emergency Medicine

## 2018-12-07 ENCOUNTER — Emergency Department (HOSPITAL_COMMUNITY)
Admission: EM | Admit: 2018-12-07 | Discharge: 2018-12-08 | Disposition: A | Discharge status: Home / Self Care | Payer: Self-pay | Attending: Emergency Medicine | Admitting: Emergency Medicine

## 2018-12-07 DIAGNOSIS — E860 Dehydration: Secondary | ICD-10-CM

## 2018-12-07 DIAGNOSIS — R109 Unspecified abdominal pain: Secondary | ICD-10-CM

## 2018-12-07 DIAGNOSIS — E119 Type 2 diabetes mellitus without complications: Secondary | ICD-10-CM | POA: Insufficient documentation

## 2018-12-07 DIAGNOSIS — U071 COVID-19: Secondary | ICD-10-CM | POA: Insufficient documentation

## 2018-12-07 DIAGNOSIS — Z79899 Other long term (current) drug therapy: Secondary | ICD-10-CM | POA: Insufficient documentation

## 2018-12-07 DIAGNOSIS — Z7984 Long term (current) use of oral hypoglycemic drugs: Secondary | ICD-10-CM | POA: Insufficient documentation

## 2018-12-07 LAB — URINALYSIS, ROUTINE W REFLEX MICROSCOPIC
Bacteria, UA: NONE SEEN
Bilirubin Urine: NEGATIVE
Glucose, UA: NEGATIVE mg/dL
Hgb urine dipstick: NEGATIVE
Ketones, ur: 5 mg/dL — AB
Leukocytes,Ua: NEGATIVE
Nitrite: NEGATIVE
Protein, ur: 100 mg/dL — AB
Specific Gravity, Urine: 1.02 (ref 1.005–1.030)
pH: 5 (ref 5.0–8.0)

## 2018-12-07 LAB — COMPREHENSIVE METABOLIC PANEL
ALT: 128 U/L — ABNORMAL HIGH (ref 0–44)
AST: 145 U/L — ABNORMAL HIGH (ref 15–41)
Albumin: 3.7 g/dL (ref 3.5–5.0)
Alkaline Phosphatase: 72 U/L (ref 38–126)
Anion gap: 12 (ref 5–15)
BUN: 9 mg/dL (ref 6–20)
CO2: 21 mmol/L — ABNORMAL LOW (ref 22–32)
Calcium: 8.7 mg/dL — ABNORMAL LOW (ref 8.9–10.3)
Chloride: 102 mmol/L (ref 98–111)
Creatinine, Ser: 1.54 mg/dL — ABNORMAL HIGH (ref 0.61–1.24)
GFR calc Af Amer: >60 mL/min (ref >60)
GFR calc non Af Amer: 57 mL/min — ABNORMAL LOW (ref >60)
Glucose, Bld: 149 mg/dL — ABNORMAL HIGH (ref 70–99)
Potassium: 3.6 mmol/L (ref 3.5–5.1)
Sodium: 135 mmol/L (ref 135–145)
Total Bilirubin: 1.3 mg/dL — ABNORMAL HIGH (ref 0.3–1.2)
Total Protein: 7.8 g/dL (ref 6.5–8.1)

## 2018-12-07 LAB — CBC
HCT: 48.9 % (ref 39.0–52.0)
Hemoglobin: 16.3 g/dL (ref 13.0–17.0)
MCH: 28.8 pg (ref 26.0–34.0)
MCHC: 33.3 g/dL (ref 30.0–36.0)
MCV: 86.4 fL (ref 80.0–100.0)
Platelets: 172 10*3/uL (ref 150–400)
RBC: 5.66 MIL/uL (ref 4.22–5.81)
RDW: 12.8 % (ref 11.5–15.5)
WBC: 4 10*3/uL (ref 4.0–10.5)
nRBC: 0 % (ref 0.0–0.2)

## 2018-12-07 LAB — LIPASE, BLOOD: Lipase: 31 U/L (ref 11–51)

## 2018-12-07 MED ORDER — SODIUM CHLORIDE 0.9 % IV BOLUS
1000.0000 mL | Freq: Once | INTRAVENOUS | Status: AC
  Administered 2018-12-07: 1000 mL via INTRAVENOUS

## 2018-12-07 MED ORDER — DICYCLOMINE HCL 10 MG/ML IM SOLN
20.0000 mg | Freq: Once | INTRAMUSCULAR | Status: AC
  Administered 2018-12-07: 20 mg via INTRAMUSCULAR
  Filled 2018-12-07: qty 2

## 2018-12-07 MED ORDER — ONDANSETRON HCL 4 MG/2ML IJ SOLN
4.0000 mg | Freq: Once | INTRAMUSCULAR | Status: AC
  Administered 2018-12-07: 4 mg via INTRAVENOUS
  Filled 2018-12-07: qty 2

## 2018-12-07 MED ORDER — SODIUM CHLORIDE 0.9% FLUSH
3.0000 mL | Freq: Once | INTRAVENOUS | Status: AC
  Administered 2018-12-07: 3 mL via INTRAVENOUS

## 2018-12-07 NOTE — ED Notes (Signed)
Pt became nauseated during pt care and had one episode of vomiting. Vomit presented as yellow and thin in consistency. ~70 cc of vomit. Nausea subsided after vomiting.

## 2018-12-07 NOTE — ED Provider Notes (Signed)
[facility] EMERGENCY DEPARTMENT Provider Note   CSN: [id number] Arrival date & time: 12/07/18  2041    History   Chief Complaint Chief Complaint  Patient presents with  . Diarrhea    HPI Mark Schneider is a 38 y.o. male with a h/o of COVID-19, morbid obesity and DM Type II presents to the emergency department with a chief complaint of abdominal cramping.  The patient endorses sudden onset abdominal cramping, onset tonight.   He reports that he weighed 376 lb on 6/1 and 363 lb yesterday due to decreased appetite and early satiety.  He reports that he feels full instantly after he starts eating.  He reports that over the last 24 hours that he is eaten some dried Cheerios, a couple pieces of [place], consumed 2 bottles of water and half of a Gatorade.  He has voided 3-4 times in the last 24 hours.  He has had 3-4 episodes of watery diarrhea over the last 3 days, but only had one episode today.  He has had no nausea or vomiting until arriving in the ER tonight when he had one episode of nonbloody, nonbilious vomiting that occurred after he attempted to stand up and he became diaphoretic and lightheaded.  He reports that he felt much better after vomiting.  He has no nausea at this time.  He is otherwise had no lightheadedness, dizziness, diaphoresis, weakness, or visual changes.   He also endorses waxing and waning fevers and chills.  T-max has been 101.  He reports he was febrile tonight and took 1 g of Tylenol at 1900.   The patient tested positive for COVID-19 on May 31.  He was seen in the ER yesterday for shortness of breath, generalized malaise, and fever.  Shortness of breath had resolved by the time he was seen in the ER.  He denies shortness of breath, chest pain, cough, back pain today. No recent leg swelling or palpations.   Mark Schneider was evaluated in Emergency Department on 12/08/2018 for the symptoms described in the history of present illness. He was  evaluated in the context of the global COVID-19 pandemic, which necessitated consideration that the patient might be at risk for infection with the SARS-CoV-2 virus that causes COVID-19. Institutional protocols and algorithms that pertain to the evaluation of patients at risk for COVID-19 are in a state of rapid change based on information released by regulatory bodies including the CDC and federal and state organizations. These policies and algorithms were followed during the patient's care in the ED.     The history is provided by the patient. No language interpreter was used.    Past Medical History:  Diagnosis Date  . Diabetes mellitus without complication [facility])     Patient Active Problem List   Diagnosis Date Noted  . New onset type 2 diabetes mellitus ([place]) 11/15/2017  . Acute renal failure (ARF) ([place]) 11/15/2017  . Morbid obesity with BMI of 50.0-59.9, adult ([place]) 11/15/2017    Past Surgical History:  Procedure Laterality Date  . DENTAL SURGERY    . TOE SURGERY          Home Medications    Prior to Admission medications   Medication Sig Start Date End Date Taking? Authorizing Provider  acetaminophen (TYLENOL) 500 MG tablet Take 500 mg by mouth every 6 (six) hours as needed for mild pain.    [provider]  dicyclomine (BENTYL) 20 MG tablet Take 1 tablet (20 mg total)  by mouth 2 (two) times daily. 12/08/18   Quinterius Gaida A, PA-C  glipiZIDE (GLUCOTROL) 5 MG tablet Take 1 tablet (5 mg total) by mouth daily before breakfast. 08/27/18   Janne NapoleonNeese, Hope M, NP  metFORMIN (GLUCOPHAGE) 500 MG tablet Take 1 tablet (500 mg total) by mouth 2 (two) times daily with a meal. 08/27/18   Damian LeavellNeese, Orchard MesaHope M, NP  olmesartan (BENICAR) 20 MG tablet Take 1 tablet (20 mg total) by mouth daily. 08/27/18   Janne NapoleonNeese, Hope M, NP  ondansetron (ZOFRAN ODT) 4 MG disintegrating tablet Take 1 tablet (4 mg total) by mouth every 8 (eight) hours as needed. 12/08/18   Geoffery Aultman A, PA-C    Family History  Family History  Problem Relation Age of Onset  . Diabetes Mother   . Diabetes Sister   . Diabetes Maternal Grandmother   . Diabetes Maternal Uncle     Social History Social History   Tobacco Use  . Smoking status: Never Smoker  . Smokeless tobacco: Never Used  Substance Use Topics  . Alcohol use: No  . Drug use: No     Allergies   Sheep-derived products and Shellfish allergy   Review of Systems Review of Systems  Constitutional: Positive for appetite change, chills, diaphoresis, fever and unexpected weight change.  HENT: Negative for congestion.   Respiratory: Positive for shortness of breath (resolved). Negative for cough and wheezing.   Cardiovascular: Negative for chest pain and palpitations.  Gastrointestinal: Positive for abdominal pain, diarrhea, nausea and vomiting.  Genitourinary: Negative for decreased urine volume, dysuria and urgency.  Musculoskeletal: Negative for back pain and myalgias.  Skin: Negative for rash.  Allergic/Immunologic: Negative for immunocompromised state.  Neurological: Positive for light-headedness. Negative for weakness, numbness and headaches.  Psychiatric/Behavioral: Negative for confusion.     Physical Exam Updated Vital Signs BP (!) 148/87   Pulse 97   Temp 99.7 F (37.6 C) (Oral)   Resp 18   Ht 5' 10.5" (1.791 m)   Wt (!) 164.7 kg   SpO2 96%   BMI 51.35 kg/m   Physical Exam Vitals signs and nursing note reviewed.  Constitutional:      Appearance: He is well-developed.     Comments: Morbidly obese.   HENT:     Head: Normocephalic.     Mouth/Throat:     Pharynx: No oropharyngeal exudate or posterior oropharyngeal erythema.     Comments: Lips are dry and cracked.  Tongue is moist. Eyes:     Extraocular Movements: Extraocular movements intact.     Conjunctiva/sclera: Conjunctivae normal.     Pupils: Pupils are equal, round, and reactive to light.  Neck:     Musculoskeletal: Neck supple.  Cardiovascular:     Rate  and Rhythm: Normal rate and regular rhythm.     Heart sounds: No murmur.  Pulmonary:     Effort: Pulmonary effort is normal.     Comments: No increased work of breathing, including nasal flaring, retractions, or accessory muscle use.  No tachypnea.  SaO2 96% when the patient is sitting upright, but does decreased to 94% with good waveform when patient is supine. Abdominal:     General: There is no distension.     Palpations: Abdomen is soft. There is no mass.     Tenderness: There is abdominal tenderness. There is no right CVA tenderness, left CVA tenderness, guarding or rebound.     Hernia: No hernia is present.  Skin:    General: Skin is warm and  dry.     Capillary Refill: Capillary refill takes less than 2 seconds.  Neurological:     Mental Status: He is alert.  Psychiatric:        Behavior: Behavior normal.     ED Treatments / Results  Labs (all labs ordered are listed, but only abnormal results are displayed) Labs Reviewed  COMPREHENSIVE METABOLIC PANEL - Abnormal; Notable for the following components:      Result Value   CO2 21 (*)    Glucose, Bld 149 (*)    Creatinine, Ser 1.54 (*)    Calcium 8.7 (*)    AST 145 (*)    ALT 128 (*)    Total Bilirubin 1.3 (*)    GFR calc non Af Amer 57 (*)    All other components within normal limits  URINALYSIS, ROUTINE W REFLEX MICROSCOPIC - Abnormal; Notable for the following components:   Color, Urine AMBER (*)    Ketones, ur 5 (*)    Protein, ur 100 (*)    All other components within normal limits  LIPASE, BLOOD  CBC    EKG None  Radiology Dg Chest Portable 1 View  Result Date: 12/06/2018 CLINICAL DATA:  Shortness of breath.  Diabetes.  COVID-19 exposure. EXAM: PORTABLE CHEST 1 VIEW COMPARISON:  No prior. FINDINGS: Cardiomegaly. No pulmonary venous congestion. Low lung volumes with mild basilar atelectasis. No focal alveolar infiltrate. No pleural effusion or pneumothorax. IMPRESSION: 1.  Cardiomegaly.  No pulmonary venous  congestion. 2.  Low lung volumes.  No focal alveolar infiltrate. Electronically Signed   By: Maisie Fushomas  Register   On: 12/06/2018 12:20    Procedures Procedures (including critical care time)  Medications Ordered in ED Medications  sodium chloride flush (NS) 0.9 % injection 3 mL (3 mLs Intravenous Given 12/07/18 2336)  dicyclomine (BENTYL) injection 20 mg (20 mg Intramuscular Given 12/07/18 2338)  ondansetron (ZOFRAN) injection 4 mg (4 mg Intravenous Given 12/07/18 2337)  sodium chloride 0.9 % bolus 1,000 mL (0 mLs Intravenous Stopped 12/08/18 0047)     Initial Impression / Assessment and Plan / ED Course  I have reviewed the triage vital signs and the nursing notes.  Pertinent labs & imaging results that were available during my care of the patient were reviewed by me and considered in my medical decision making (see chart for details).        38 year old male with a h/o of COVID-19, morbid obesity and DM Type II presenting with abdominal cramping and near syncopal episode.  He has had significantly decreased p.o. intake over the last few days due to early satiety.  He has been having diarrhea for 3 days, but only had one episode today.  He has no shortness of breath, chest pain, or cough complaints.  Patient was discussed with Dr. Clarene DukeLittle, attending physician.  Afebrile, but tachycardic to 110 on arrival.  SaO2 95% on room air.  He was seen and evaluated yesterday in the ER and chest x-ray was reviewed which demonstrated low lung volumes, but otherwise negative.  Transaminases are elevated, which would be expected with COVID-19.  His creatinine is mildly elevated to 1.54.  I suspect this is due to dehydration in the setting of poor p.o. intake and diarrhea.  Bicarb is 21 with normal anion gap, and otherwise patient has no electrolyte abnormalities.  He appears dehydrated on clinical exam.  Since the patient is having no shortness of breath, will judiciously administer fluids.  He has no orthostatic  hypotension.  Will give Bentyl for abdominal cramping.  Apparently after arrival to the ER, the patient had one episode of vomiting.  Zofran given.  Reports the episode of vomiting occurred along with other near syncope episodes.  After fluid administration and supportive measures, patient is feeling much better.  He was ambulated without hypoxia or lightheadedness.  We will discharge the patient home with Bentyl and Zofran.  He was advised to follow-up with primary care.  Return precautions to the ER given.  Safe for discharge home with outpatient follow-up at this time.  Final Clinical Impressions(s) / ED Diagnoses   Final diagnoses:  COVID-19 virus infection  Abdominal cramping  Dehydration    ED Discharge Orders         Ordered    dicyclomine (BENTYL) 20 MG tablet  2 times daily     12/08/18 0117    ondansetron (ZOFRAN ODT) 4 MG disintegrating tablet  Every 8 hours PRN     12/08/18 0117           Wylie Coon A, PA-C 12/08/18 0958    Little, Ambrose Finlandachel Morgan, MD 12/08/18 1715

## 2018-12-07 NOTE — ED Triage Notes (Signed)
Pt c/o diarrhea and a 13lb weight loss x 1 week. Denies abdominal pain or nausea/vomiting. Hx DM type 2. Pt also has a fever, tested positive for COVID-19 on Tuesday of this week. Denies shortness of breath/chest pain or changes in cough. Took 1g tylenol 30 minutes PTA.

## 2018-12-08 MED ORDER — ONDANSETRON 4 MG PO TBDP: 4.0000 mg | ORAL_TABLET | Freq: Three times a day (TID) | ORAL | 0 refills | Status: DC | PRN

## 2018-12-08 MED ORDER — DICYCLOMINE HCL 20 MG PO TABS: 20.0000 mg | ORAL_TABLET | Freq: Two times a day (BID) | ORAL | 0 refills | Status: DC

## 2018-12-08 NOTE — ED Notes (Signed)
Pt ingested PO fluids without complaint. No N/V or abd pain. Pt denies lightheadedness at this time.

## 2018-12-08 NOTE — ED Notes (Signed)
Patient verbalizes understanding of discharge instructions. Opportunity for questioning and answers were provided. Armband removed by staff, pt discharged from ED.  

## 2018-12-08 NOTE — Discharge Instructions (Signed)
Thank you for allowing me to care for you today in the Emergency Department.   It is very important that you drink plenty of fluids to avoid dehydration.  You should try to drink at least 64 ounces of fluids per day.  If you are feeling nauseated or sick to her stomach, you can let 1 tablet of Zofran dissolve in your tongue every 8 hours as needed.  You can swallow 1 tablet of Bentyl 2 times daily as needed for abdominal cramping.  These medications have been called into your pharmacy.  If your diarrhea returns and is worse, Imodium is available over-the-counter and can be taken as directed for up to 2 days.  Try to check your temperature every 6-8 hours.  It is possible that if your not feeling well and your appetite is decreased that your fever may be creeping back up.  You can take 1000 mg of Tylenol every 8 hours for fever.  Return to the emergency department if you develop bloody diarrhea, if you pass out, if you develop respiratory distress, if you have a fever that does not go down despite taking Tylenol appropriately as described above, or other new, concerning symptoms.

## 2019-10-24 ENCOUNTER — Other Ambulatory Visit: Payer: Self-pay

## 2019-10-24 DIAGNOSIS — E1165 Type 2 diabetes mellitus with hyperglycemia: Secondary | ICD-10-CM | POA: Diagnosis not present

## 2019-10-24 DIAGNOSIS — Z7984 Long term (current) use of oral hypoglycemic drugs: Secondary | ICD-10-CM | POA: Insufficient documentation

## 2019-10-24 DIAGNOSIS — Z79899 Other long term (current) drug therapy: Secondary | ICD-10-CM | POA: Insufficient documentation

## 2019-10-24 LAB — CBC
HCT: 45.8 % (ref 39.0–52.0)
Hemoglobin: 15.5 g/dL (ref 13.0–17.0)
MCH: 29.9 pg (ref 26.0–34.0)
MCHC: 33.8 g/dL (ref 30.0–36.0)
MCV: 88.4 fL (ref 80.0–100.0)
Platelets: 175 10*3/uL (ref 150–400)
RBC: 5.18 MIL/uL (ref 4.22–5.81)
RDW: 12.4 % (ref 11.5–15.5)
WBC: 6 10*3/uL (ref 4.0–10.5)
nRBC: 0 % (ref 0.0–0.2)

## 2019-10-24 LAB — CBG MONITORING, ED: Glucose-Capillary: >600 mg/dL (ref 70–99)

## 2019-10-24 NOTE — ED Triage Notes (Signed)
Pt sts his meter read high for a blood sugar. Pt is in no distress

## 2019-10-25 ENCOUNTER — Emergency Department (HOSPITAL_COMMUNITY)
Admission: EM | Admit: 2019-10-25 | Discharge: 2019-10-25 | Disposition: A | Discharge status: Home / Self Care | Payer: 59 | Attending: Emergency Medicine | Admitting: Emergency Medicine

## 2019-10-25 DIAGNOSIS — R739 Hyperglycemia, unspecified: Secondary | ICD-10-CM

## 2019-10-25 LAB — BLOOD GAS, VENOUS
Acid-base deficit: 1 mmol/L (ref 0.0–2.0)
Bicarbonate: 25.6 mmol/L (ref 20.0–28.0)
O2 Saturation: 66.2 %
Patient temperature: 98.6
pCO2, Ven: 52.5 mmHg (ref 44.0–60.0)
pH, Ven: 7.309 (ref 7.250–7.430)
pO2, Ven: 38.3 mmHg (ref 32.0–45.0)

## 2019-10-25 LAB — URINALYSIS, ROUTINE W REFLEX MICROSCOPIC
Bacteria, UA: NONE SEEN
Bilirubin Urine: NEGATIVE
Glucose, UA: >=500 mg/dL — AB
Hgb urine dipstick: NEGATIVE
Ketones, ur: 5 mg/dL — AB
Nitrite: NEGATIVE
Protein, ur: NEGATIVE mg/dL
Specific Gravity, Urine: 1.025 (ref 1.005–1.030)
pH: 6 (ref 5.0–8.0)

## 2019-10-25 LAB — CBG MONITORING, ED
Glucose-Capillary: >600 mg/dL (ref 70–99)
Glucose-Capillary: 447 mg/dL — ABNORMAL HIGH (ref 70–99)
Glucose-Capillary: 407 mg/dL — ABNORMAL HIGH (ref 70–99)
Glucose-Capillary: 308 mg/dL — ABNORMAL HIGH (ref 70–99)

## 2019-10-25 LAB — BASIC METABOLIC PANEL
Anion gap: 15 (ref 5–15)
BUN: 16 mg/dL (ref 6–20)
CO2: 25 mmol/L (ref 22–32)
Calcium: 9.7 mg/dL (ref 8.9–10.3)
Chloride: 93 mmol/L — ABNORMAL LOW (ref 98–111)
Creatinine, Ser: 1.61 mg/dL — ABNORMAL HIGH (ref 0.61–1.24)
GFR calc Af Amer: >60 mL/min (ref >60)
GFR calc non Af Amer: 54 mL/min — ABNORMAL LOW (ref >60)
Glucose, Bld: 745 mg/dL (ref 70–99)
Potassium: 4.6 mmol/L (ref 3.5–5.1)
Sodium: 133 mmol/L — ABNORMAL LOW (ref 135–145)

## 2019-10-25 MED ORDER — SODIUM CHLORIDE 0.9 % IV BOLUS
1000.0000 mL | Freq: Once | INTRAVENOUS | Status: AC
  Administered 2019-10-25: 05:00:00 1000 mL via INTRAVENOUS

## 2019-10-25 MED ORDER — SODIUM CHLORIDE 0.9 % IV BOLUS
1000.0000 mL | Freq: Once | INTRAVENOUS | Status: AC
  Administered 2019-10-25: 02:00:00 1000 mL via INTRAVENOUS

## 2019-10-25 MED ORDER — SODIUM CHLORIDE 0.9 % IV BOLUS
1000.0000 mL | Freq: Once | INTRAVENOUS | Status: AC
  Administered 2019-10-25: 03:00:00 1000 mL via INTRAVENOUS

## 2019-10-25 MED ORDER — INSULIN ASPART 100 UNIT/ML ~~LOC~~ SOLN
5.0000 [IU] | Freq: Once | SUBCUTANEOUS | Status: AC
  Administered 2019-10-25: 5 [IU] via INTRAVENOUS
  Filled 2019-10-25: qty 0.05

## 2019-10-25 MED ORDER — INSULIN ASPART 100 UNIT/ML ~~LOC~~ SOLN
10.0000 [IU] | Freq: Once | SUBCUTANEOUS | Status: AC
  Administered 2019-10-25: 02:00:00 10 [IU] via INTRAVENOUS
  Filled 2019-10-25: qty 0.1

## 2019-10-25 NOTE — ED Provider Notes (Signed)
[name] DEPT Provider Note   CSN: [id number] Arrival date & time: 10/24/19  2229     History Chief Complaint  Patient presents with  . Hyperglycemia    Mark Schneider is a 39 y.o. male.  Patient presents to the emergency department with a chief complaint of hyperglycemia.  He states that over the past day or so he is felt like his blood sugar has been running high.  When he woke up today he checked his sugar and it was high.  He states that he ate some sherbet, and thinks that this may have triggered the hyperglycemia.  He states that he has been compliant with his glipizide.  He reports associated urinary frequency.  He denies any recent illnesses.  Denies any abdominal pain.  Denies any fevers, chills, nausea, or vomiting.  Denies any other associated symptoms.  The history is provided by the patient. No language interpreter was used.       Past Medical History:  Diagnosis Date  . Diabetes mellitus without complication [facility])     Patient Active Problem List   Diagnosis Date Noted  . New onset type 2 diabetes mellitus ([place]) 11/15/2017  . Acute renal failure (ARF) ([place]) 11/15/2017  . Morbid obesity with BMI of 50.0-59.9, adult ([place]) 11/15/2017    Past Surgical History:  Procedure Laterality Date  . DENTAL SURGERY    . TOE SURGERY         Family History  Problem Relation Age of Onset  . Diabetes Mother   . Diabetes Sister   . Diabetes Maternal Grandmother   . Diabetes Maternal Uncle     Social History   Tobacco Use  . Smoking status: Never Smoker  . Smokeless tobacco: Never Used  Substance Use Topics  . Alcohol use: No  . Drug use: No    Home Medications Prior to Admission medications   Medication Sig Start Date End Date Taking? Authorizing Provider  acetaminophen (TYLENOL) 500 MG tablet Take 500 mg by mouth every 6 (six) hours as needed for mild pain.    [provider]  dicyclomine (BENTYL) 20 MG tablet  Take 1 tablet (20 mg total) by mouth 2 (two) times daily. 12/08/18   McDonald, Mia A, PA-C  glipiZIDE (GLUCOTROL) 5 MG tablet Take 1 tablet (5 mg total) by mouth daily before breakfast. 08/27/18   Ashley Murrain, NP  metFORMIN (GLUCOPHAGE) 500 MG tablet Take 1 tablet (500 mg total) by mouth 2 (two) times daily with a meal. 08/27/18   Janit Bern, Maynard, NP  olmesartan (BENICAR) 20 MG tablet Take 1 tablet (20 mg total) by mouth daily. 08/27/18   Ashley Murrain, NP  ondansetron (ZOFRAN ODT) 4 MG disintegrating tablet Take 1 tablet (4 mg total) by mouth every 8 (eight) hours as needed. 12/08/18   McDonald, Mia A, PA-C    Allergies    Sheep-derived products and Shellfish allergy  Review of Systems   Review of Systems  All other systems reviewed and are negative.   Physical Exam Updated Vital Signs BP (!) 140/106 (BP Location: Right Arm)   Pulse 92   Temp 97.8 F (36.6 C) (Oral)   Resp 18   Ht 5' 10.5" (1.791 m)   Wt (!) 174.6 kg   SpO2 98%   BMI 54.46 kg/m   Physical Exam Vitals and nursing note reviewed.  Constitutional:      Appearance: He is well-developed.  HENT:     Head:  Normocephalic and atraumatic.  Eyes:     Conjunctiva/sclera: Conjunctivae normal.  Cardiovascular:     Rate and Rhythm: Normal rate and regular rhythm.     Heart sounds: No murmur.  Pulmonary:     Effort: Pulmonary effort is normal. No respiratory distress.     Breath sounds: Normal breath sounds.  Abdominal:     Palpations: Abdomen is soft.     Tenderness: There is no abdominal tenderness.  Musculoskeletal:        General: Normal range of motion.     Cervical back: Neck supple.  Skin:    General: Skin is warm and dry.  Neurological:     Mental Status: He is alert.  Psychiatric:        Mood and Affect: Mood normal.        Behavior: Behavior normal.     ED Results / Procedures / Treatments   Labs (all labs ordered are listed, but only abnormal results are displayed) Labs Reviewed  BASIC METABOLIC  PANEL - Abnormal; Notable for the following components:      Result Value   Sodium 133 (*)    Chloride 93 (*)    Glucose, Bld 745 (*)    Creatinine, Ser 1.61 (*)    GFR calc non Af Amer 54 (*)    All other components within normal limits  URINALYSIS, ROUTINE W REFLEX MICROSCOPIC - Abnormal; Notable for the following components:   Color, Urine STRAW (*)    Glucose, UA >=500 (*)    Ketones, ur 5 (*)    Leukocytes,Ua TRACE (*)    All other components within normal limits  CBG MONITORING, ED - Abnormal; Notable for the following components:   Glucose-Capillary >600 (*)    All other components within normal limits  CBG MONITORING, ED - Abnormal; Notable for the following components:   Glucose-Capillary >600 (*)    All other components within normal limits  CBC  I-STAT VENOUS BLOOD GAS, ED    EKG None  Radiology No results found.  Procedures .Critical Care Performed by: Roxy Horseman, PA-C Authorized by: Roxy Horseman, PA-C   Critical care provider statement:    Critical care time (minutes):  50   Critical care was necessary to treat or prevent imminent or life-threatening deterioration of the following conditions:  Metabolic crisis   Critical care was time spent personally by me on the following activities:  Discussions with consultants, evaluation of patient's response to treatment, examination of patient, ordering and performing treatments and interventions, ordering and review of laboratory studies, ordering and review of radiographic studies, pulse oximetry, re-evaluation of patient's condition, obtaining history from patient or surrogate and review of old charts   (including critical care time)  Medications Ordered in ED Medications  sodium chloride 0.9 % bolus 1,000 mL (has no administration in time range)  insulin aspart (novoLOG) injection 10 Units (has no administration in time range)    ED Course  I have reviewed the triage vital signs and the nursing  notes.  Pertinent labs & imaging results that were available during my care of the patient were reviewed by me and considered in my medical decision making (see chart for details).    MDM Rules/Calculators/A&P                      This patient complains of hyperglycemia, this involves an extensive number of treatment options, and is a complaint that carries with it a high risk of  complications and morbidity.  The differential diagnosis includes uncomplicated hyperglycemia, DKA, HHS   I ordered, reviewed, and interpreted labs, which included CBC, which shows no significant leukocytosis, BMP which shows glucose of 745, mild hyponatremia at 133, chloride is 93, anion gap is 15.  Creatinine is noted to be 1.61.  Will give fluids.  Will start with 10 units IV insulin.  He does have 5 ketones in his urine, but does not seem to be in DKA.  Will check VBG... VBG shows not acidosis.  I ordered medication fluids and insulin for hyperglycemia. Previous records obtained and reviewed has had prior visit for the same.  Critical interventions: Multiple fluid boluses for hyperglycemia.  After the interventions stated above, I reevaluated the patient and found having CBGs trending down.  Feeling improved.  Feel that he is stable and ready for discharge.  Final Clinical Impression(s) / ED Diagnoses Final diagnoses:  Hyperglycemia    Rx / DC Orders ED Discharge Orders    None       Roxy Horseman, PA-C 10/25/19 8182    Nira Conn, MD 10/25/19 [phone]

## 2019-10-25 NOTE — ED Notes (Addendum)
Date and time results received: 10/25/19 0016 (use smartphrase ".now" to insert current time)  Test: Glucose Critical Value: 745  Name of Provider Notified: cardoma md  Orders Received? Or Actions Taken?: waiting on orders.

## 2019-10-25 NOTE — ED Provider Notes (Signed)
Attestation: Medical screening examination/treatment/procedure(s) were conducted as a shared visit with non-physician practitioner(s) and myself.  I personally evaluated the patient during the encounter.   Briefly, the patient is a 39 y.o. male with h/o DM on oral meds, here for elevated blood sugars.   Vitals:   10/25/19 0600 10/25/19 0647  BP: 110/82 110/82  Pulse: 78 80  Resp: 14 15  Temp:    SpO2: 97% 98%    CONSTITUTIONAL:  well-appearing, NAD NEURO:  Alert and oriented x 3, no focal deficits EYES:  pupils equal and reactive ENT/NECK:  trachea midline, no JVD CARDIO:  reg rate, reg rhythm, well-perfused PULM:  nonlabored breathing GI/GU:  Abdomin non-distended MSK/SPINE:  No gross deformities, no edema SKIN:  no rash, atraumatic PSYCH:  Appropriate speech and behavior   EKG Interpretation  Date/Time:    Ventricular Rate:    PR Interval:    QRS Duration:   QT Interval:    QTC Calculation:   R Axis:     Text Interpretation:         Work up w/o evidence of DKA. Treated with subQ insulin and IVF. BS down to 300. HDS. The patient appears reasonably screened and/or stabilized for discharge and I doubt any other medical condition or other [facility] requiring further screening, evaluation, or treatment in the ED at this time prior to discharge. Safe for discharge with strict return precautions.   CRITICAL CARE Performed by: Amadeo Garnet Al Gagen Total critical care time: 15 minutes Critical care time was exclusive of separately billable procedures and treating other patients. Critical care was necessary to treat or prevent imminent or life-threatening deterioration. Critical care was time spent personally by me on the following activities: development of treatment plan with patient and/or surrogate as well as nursing, discussions with consultants, evaluation of patient's response to treatment, examination of patient, obtaining history from patient or surrogate, ordering and  performing treatments and interventions, ordering and review of laboratory studies, ordering and review of radiographic studies, pulse oximetry and re-evaluation of patient's condition.        Nira Conn, MD 10/25/19 [phone]

## 2019-11-27 ENCOUNTER — Encounter: Payer: Self-pay | Admitting: Emergency Medicine

## 2019-11-27 ENCOUNTER — Other Ambulatory Visit: Payer: Self-pay

## 2019-11-27 ENCOUNTER — Ambulatory Visit (INDEPENDENT_AMBULATORY_CARE_PROVIDER_SITE_OTHER): Payer: 59 | Admitting: Emergency Medicine

## 2019-11-27 VITALS — BP 131/86 | HR 90 | Temp 98.2°F | Resp 16 | Ht 70.5 in | Wt 358.0 lb

## 2019-11-27 DIAGNOSIS — I10 Essential (primary) hypertension: Secondary | ICD-10-CM

## 2019-11-27 DIAGNOSIS — E119 Type 2 diabetes mellitus without complications: Secondary | ICD-10-CM | POA: Diagnosis not present

## 2019-11-27 DIAGNOSIS — Z7689 Persons encountering health services in other specified circumstances: Secondary | ICD-10-CM

## 2019-11-27 DIAGNOSIS — Z23 Encounter for immunization: Secondary | ICD-10-CM

## 2019-11-27 DIAGNOSIS — I152 Hypertension secondary to endocrine disorders: Secondary | ICD-10-CM | POA: Insufficient documentation

## 2019-11-27 DIAGNOSIS — E1159 Type 2 diabetes mellitus with other circulatory complications: Secondary | ICD-10-CM | POA: Diagnosis not present

## 2019-11-27 DIAGNOSIS — Z8616 Personal history of COVID-19: Secondary | ICD-10-CM | POA: Insufficient documentation

## 2019-11-27 DIAGNOSIS — Z6841 Body Mass Index (BMI) 40.0 and over, adult: Secondary | ICD-10-CM | POA: Diagnosis not present

## 2019-11-27 LAB — POCT GLYCOSYLATED HEMOGLOBIN (HGB A1C): Hemoglobin A1C: 13.5 % — AB (ref 4.0–5.6)

## 2019-11-27 LAB — GLUCOSE, POCT (MANUAL RESULT ENTRY): POC Glucose: 88 mg/dl (ref 70–99)

## 2019-11-27 MED ORDER — ROSUVASTATIN CALCIUM 10 MG PO TABS: 10.0000 mg | ORAL_TABLET | Freq: Every day | ORAL | 3 refills | Status: DC

## 2019-11-27 MED ORDER — GLIPIZIDE 5 MG PO TABS: 5.0000 mg | ORAL_TABLET | Freq: Two times a day (BID) | ORAL | 3 refills | Status: DC

## 2019-11-27 MED ORDER — OLMESARTAN MEDOXOMIL 20 MG PO TABS: 20.0000 mg | ORAL_TABLET | Freq: Every day | ORAL | 3 refills | Status: DC

## 2019-11-27 MED ORDER — METFORMIN HCL 1000 MG PO TABS: 1000.0000 mg | ORAL_TABLET | Freq: Two times a day (BID) | ORAL | 3 refills | Status: DC

## 2019-11-27 NOTE — Assessment & Plan Note (Signed)
Well-controlled hypertension.  Continue Benicar 20 mg daily. Uncontrolled diabetes with hemoglobin A1c of 13.5.  Will increase Metformin to 1000 mg twice a day and increase glipizide to 5 mg twice a day.  Will start statin therapy with rosuvastatin 10 mg daily.  Diet and nutrition discussed.  Continue physical exercise. Follow-up in 3 months.

## 2019-11-27 NOTE — Progress Notes (Signed)
Mark Schneider 38 y.o.   Chief Complaint  Patient presents with   Establish Care   Diabetes   Medication Refill    PEND    HISTORY OF PRESENT ILLNESS: This is a 39 y.o. male first visit to this office, here to establish care with me.  Has the following chronic medical problems: 1.  Diabetes: On Metformin 500 mg twice a day and glipizide 5 mg once a day.  Does not check blood sugars at home. 2.  Hypertension: On Benicar 20 mg daily. Needs medication refills. No other complaints or medical concerns today.  HPI   Prior to Admission medications   Medication Sig Start Date End Date Taking? Authorizing Provider  glipiZIDE (GLUCOTROL) 5 MG tablet Take 1 tablet (5 mg total) by mouth daily before breakfast. 08/27/18  Yes Damian Leavell, [place], NP  metFORMIN (GLUCOPHAGE) 500 MG tablet Take 1 tablet (500 mg total) by mouth 2 (two) times daily with a meal. 08/27/18  Yes Neese, [place], NP  olmesartan (BENICAR) 20 MG tablet Take 1 tablet (20 mg total) by mouth daily. 08/27/18  Yes Neese, Hope M, NP  dicyclomine (BENTYL) 20 MG tablet Take 1 tablet (20 mg total) by mouth 2 (two) times daily. Patient not taking: Reported on 10/25/2019 12/08/18   McDonald, Mia A, PA-C  ondansetron (ZOFRAN ODT) 4 MG disintegrating tablet Take 1 tablet (4 mg total) by mouth every 8 (eight) hours as needed. Patient not taking: Reported on 11/27/2019 12/08/18   Frederik Pear A, PA-C    Allergies  Allergen Reactions   Sheep-Derived Products Swelling   Shellfish Allergy Swelling    Patient Active Problem List   Diagnosis Date Noted   New onset type 2 diabetes mellitus (HCC) 11/15/2017   Acute renal failure (ARF) (HCC) 11/15/2017   Morbid obesity with BMI of 50.0-59.9, adult (HCC) 11/15/2017    Past Medical History:  Diagnosis Date   Diabetes mellitus without complication (HCC)     Past Surgical History:  Procedure Laterality Date   DENTAL SURGERY     TOE SURGERY      Social History   Socioeconomic  History   Marital status: Single    Spouse name: Not on file   Number of children: Not on file   Years of education: Not on file   Highest education level: Not on file  Occupational History   Occupation: Cook for [employer]  Tobacco Use   Smoking status: Never Smoker   Smokeless tobacco: Never Used  Substance and Sexual Activity   Alcohol use: No   Drug use: No   Sexual activity: Not Currently  Other Topics Concern   Not on file  Social History Narrative   Not on file   Social Determinants of Health   Financial Resource Strain:    Difficulty of Paying Living Expenses:   Food Insecurity:    Worried About Programme researcher, broadcasting/film/video in the Last Year:    Barista in the Last Year:   Transportation Needs:    Freight forwarder (Medical):    Lack of Transportation (Non-Medical):   Physical Activity:    Days of Exercise per Week:    Minutes of Exercise per Session:   Stress:    Feeling of Stress :   Social Connections:    Frequency of Communication with Friends and Family:    Frequency of Social Gatherings with Friends and Family:    Attends Religious Services:    Active Member  of Clubs or Organizations:    Attends Music therapist:    Marital Status:   Intimate Partner Violence:    Fear of Current or Ex-Partner:    Emotionally Abused:    Physically Abused:    Sexually Abused:     Family History  Problem Relation Age of Onset   Diabetes Mother    Diabetes Sister    Diabetes Maternal Grandmother    Diabetes Maternal Uncle      Review of Systems  Constitutional: Negative.  Negative for chills and fever.  HENT: Negative.  Negative for congestion and sore throat.   Respiratory: Negative.  Negative for cough and shortness of breath.   Cardiovascular: Negative.  Negative for chest pain and palpitations.  Gastrointestinal: Negative.  Negative for abdominal pain, blood in stool, diarrhea, melena, nausea and vomiting.    Genitourinary: Negative.  Negative for dysuria and hematuria.  Musculoskeletal: Negative.   Skin: Negative.  Negative for rash.  Neurological: Negative.  Negative for dizziness and headaches.  All other systems reviewed and are negative.  Today's Vitals   11/27/19 1402  BP: 131/86  Pulse: 90  Resp: 16  Temp: 98.2 F (36.8 C)  TempSrc: Temporal  SpO2: 94%  Weight: (!) 358 lb (162.4 kg)  Height: 5' 10.5" (1.791 m)   Body mass index is 50.64 kg/m.   Physical Exam Vitals reviewed.  Constitutional:      Appearance: Normal appearance. He is obese.  HENT:     Head: Normocephalic.  Eyes:     Extraocular Movements: Extraocular movements intact.     Conjunctiva/sclera: Conjunctivae normal.     Pupils: Pupils are equal, round, and reactive to light.  Cardiovascular:     Rate and Rhythm: Normal rate and regular rhythm.     Pulses: Normal pulses.     Heart sounds: Normal heart sounds.  Pulmonary:     Effort: Pulmonary effort is normal.     Breath sounds: Normal breath sounds.  Musculoskeletal:        General: Normal range of motion.     Cervical back: Normal range of motion and neck supple.     Right lower leg: No edema.     Left lower leg: No edema.  Skin:    General: Skin is warm and dry.  Neurological:     General: No focal deficit present.     Mental Status: He is alert and oriented to person, place, and time.  Psychiatric:        Mood and Affect: Mood normal.        Behavior: Behavior normal.    Results for orders placed or performed in visit on 11/27/19 (from the past 24 hour(s))  POCT glucose (manual entry)     Status: None   Collection Time: 11/27/19  2:28 PM  Result Value Ref Range   POC Glucose 88 70 - 99 mg/dl  POCT glycosylated hemoglobin (Hb A1C)     Status: Abnormal   Collection Time: 11/27/19  2:30 PM  Result Value Ref Range   Hemoglobin A1C 13.5 (A) 4.0 - 5.6 %   HbA1c POC (<> result, manual entry)     HbA1c, POC (prediabetic range)     HbA1c, POC  (controlled diabetic range)       ASSESSMENT & PLAN: Hypertension associated with diabetes ([place]) Well-controlled hypertension.  Continue Benicar 20 mg daily. Uncontrolled diabetes with hemoglobin A1c of 13.5.  Will increase Metformin to 1000 mg twice a day and increase  glipizide to 5 mg twice a day.  Will start statin therapy with rosuvastatin 10 mg daily.  Diet and nutrition discussed.  Continue physical exercise. Follow-up in 3 months.  Mark Schneider was seen today for establish care, diabetes and medication refill.  Diagnoses and all orders for this visit:  Hypertension associated with diabetes (HCC) -     CBC with Differential/Platelet -     Comprehensive metabolic panel -     Lipid panel -     POCT glucose (manual entry) -     POCT glycosylated hemoglobin (Hb A1C) -     HM Diabetes Foot Exam -     glipiZIDE (GLUCOTROL) 5 MG tablet; Take 1 tablet (5 mg total) by mouth 2 (two) times daily before a meal. -     olmesartan (BENICAR) 20 MG tablet; Take 1 tablet (20 mg total) by mouth daily. -     rosuvastatin (CRESTOR) 10 MG tablet; Take 1 tablet (10 mg total) by mouth daily. -     metFORMIN (GLUCOPHAGE) 1000 MG tablet; Take 1 tablet (1,000 mg total) by mouth 2 (two) times daily with a meal.  Encounter to establish care  Body mass index (BMI) of 50-59.9 in adult [facility](HCC)  Diabetes mellitus, new onset (HCC)  History of COVID-19 -     SAR CoV2 Serology (COVID 19)AB(IGG)IA  Need for prophylactic vaccination against Streptococcus pneumoniae (pneumococcus)  Need for diphtheria-tetanus-pertussis (Tdap) vaccine    Patient Instructions       If you have lab work done today you will be contacted with your lab results within the next 2 weeks.  If you have not heard from us then please contact us. The fastest way to get your results is to register for My Chart.   IF you received an x-ray today, you will receive an invoice from [facility]. Please contact [facility] at  [phone] with questions or concerns regarding your invoice.   IF you received labwork today, you will receive an invoice from [facility]. Please contact [facility] at [phone] with questions or concerns regarding your invoice.   Our billing staff will not be able to assist you with questions regarding bills from these companies.  You will be contacted with the lab results as soon as they are available. The fastest way to get your results is to activate your My Chart account. Instructions are located on the last page of this paperwork. If you have not heard from us regarding the results in 2 weeks, please contact this office.      Calorie Counting for Weight Loss Calories are units of energy. Your body needs a certain amount of calories from food to keep you going throughout the day. When you eat more calories than your body needs, your body stores the extra calories as fat. When you eat fewer calories than your body needs, your body burns fat to get the energy it needs. Calorie counting means keeping track of how many calories you eat and drink each day. Calorie counting can be helpful if you need to lose weight. If you make sure to eat fewer calories than your body needs, you should lose weight. Ask your health care provider what a healthy weight is for you. For calorie counting to work, you will need to eat the right number of calories in a day in order to lose a healthy amount of weight per week. A dietitian can help you determine how many calories you need in a day and will give  you suggestions on how to reach your calorie goal.  A healthy amount of weight to lose per week is usually 1-2 lb (0.5-0.9 kg). This usually means that your daily calorie intake should be reduced by 500-750 calories.  Eating 1,200 - 1,500 calories per day can help most women lose weight.  Eating 1,500 - 1,800 calories per day can help most men lose weight. What is my plan? My goal is to have __________ calories  per day. If I have this many calories per day, I should lose around __________ pounds per week. What do I need to know about calorie counting? In order to meet your daily calorie goal, you will need to:  Find out how many calories are in each food you would like to eat. Try to do this before you eat.  Decide how much of the food you plan to eat.  Write down what you ate and how many calories it had. Doing this is called keeping a food log. To successfully lose weight, it is important to balance calorie counting with a healthy lifestyle that includes regular activity. Aim for 150 minutes of moderate exercise (such as walking) or 75 minutes of vigorous exercise (such as running) each week. Where do I find calorie information?  The number of calories in a food can be found on a Nutrition Facts label. If a food does not have a Nutrition Facts label, try to look up the calories online or ask your dietitian for help. Remember that calories are listed per serving. If you choose to have more than one serving of a food, you will have to multiply the calories per serving by the amount of servings you plan to eat. For example, the label on a package of bread might say that a serving size is 1 slice and that there are 90 calories in a serving. If you eat 1 slice, you will have eaten 90 calories. If you eat 2 slices, you will have eaten 180 calories. How do I keep a food log? Immediately after each meal, record the following information in your food log:  What you ate. Don't forget to include toppings, sauces, and other extras on the food.  How much you ate. This can be measured in cups, ounces, or number of items.  How many calories each food and drink had.  The total number of calories in the meal. Keep your food log near you, such as in a small notebook in your pocket, or use a mobile app or website. Some programs will calculate calories for you and show you how many calories you have left for the day  to meet your goal. What are some calorie counting tips?   Use your calories on foods and drinks that will fill you up and not leave you hungry: ? Some examples of foods that fill you up are nuts and nut butters, vegetables, lean proteins, and high-fiber foods like whole grains. High-fiber foods are foods with more than 5 g fiber per serving. ? Drinks such as sodas, specialty coffee drinks, alcohol, and juices have a lot of calories, yet do not fill you up.  Eat nutritious foods and avoid empty calories. Empty calories are calories you get from foods or beverages that do not have many vitamins or protein, such as candy, sweets, and soda. It is better to have a nutritious high-calorie food (such as an avocado) than a food with few nutrients (such as a bag of chips).  Know how many  calories are in the foods you eat most often. This will help you calculate calorie counts faster.  Pay attention to calories in drinks. Low-calorie drinks include water and unsweetened drinks.  Pay attention to nutrition labels for "low fat" or "fat free" foods. These foods sometimes have the same amount of calories or more calories than the full fat versions. They also often have added sugar, starch, or salt, to make up for flavor that was removed with the fat.  Find a way of tracking calories that works for you. Get creative. Try different apps or programs if writing down calories does not work for you. What are some portion control tips?  Know how many calories are in a serving. This will help you know how many servings of a certain food you can have.  Use a measuring cup to measure serving sizes. You could also try weighing out portions on a kitchen scale. With time, you will be able to estimate serving sizes for some foods.  Take some time to put servings of different foods on your favorite plates, bowls, and cups so you know what a serving looks like.  Try not to eat straight from a bag or box. Doing this can  lead to overeating. Put the amount you would like to eat in a cup or on a plate to make sure you are eating the right portion.  Use smaller plates, glasses, and bowls to prevent overeating.  Try not to multitask (for example, watch TV or use your computer) while eating. If it is time to eat, sit down at a table and enjoy your food. This will help you to know when you are full. It will also help you to be aware of what you are eating and how much you are eating. What are tips for following this plan? Reading food labels  Check the calorie count compared to the serving size. The serving size may be smaller than what you are used to eating.  Check the source of the calories. Make sure the food you are eating is high in vitamins and protein and low in saturated and trans fats. Shopping  Read nutrition labels while you shop. This will help you make healthy decisions before you decide to purchase your food.  Make a grocery list and stick to it. Cooking  Try to cook your favorite foods in a healthier way. For example, try baking instead of frying.  Use low-fat dairy products. Meal planning  Use more fruits and vegetables. Half of your plate should be fruits and vegetables.  Include lean proteins like poultry and fish. How do I count calories when eating out?  Ask for smaller portion sizes.  Consider sharing an entree and sides instead of getting your own entree.  If you get your own entree, eat only half. Ask for a box at the beginning of your meal and put the rest of your entree in it so you are not tempted to eat it.  If calories are listed on the menu, choose the lower calorie options.  Choose dishes that include vegetables, fruits, whole grains, low-fat dairy products, and lean protein.  Choose items that are boiled, broiled, grilled, or steamed. Stay away from items that are buttered, battered, fried, or served with cream sauce. Items labeled "crispy" are usually fried, unless  stated otherwise.  Choose water, low-fat milk, unsweetened iced tea, or other drinks without added sugar. If you want an alcoholic beverage, choose a lower calorie option such as  a glass of wine or light beer.  Ask for dressings, sauces, and syrups on the side. These are usually high in calories, so you should limit the amount you eat.  If you want a salad, choose a garden salad and ask for grilled meats. Avoid extra toppings like bacon, cheese, or fried items. Ask for the dressing on the side, or ask for olive oil and vinegar or lemon to use as dressing.  Estimate how many servings of a food you are given. For example, a serving of cooked rice is  cup or about the size of half a baseball. Knowing serving sizes will help you be aware of how much food you are eating at restaurants. The list below tells you how big or small some common portion sizes are based on everyday objects: ? 1 oz--4 stacked dice. ? 3 oz--1 deck of cards. ? 1 tsp--1 die. ? 1 Tbsp-- a ping-pong ball. ? 2 Tbsp--1 ping-pong ball. ?  cup-- baseball. ? 1 cup--1 baseball. Summary  Calorie counting means keeping track of how many calories you eat and drink each day. If you eat fewer calories than your body needs, you should lose weight.  A healthy amount of weight to lose per week is usually 1-2 lb (0.5-0.9 kg). This usually means reducing your daily calorie intake by 500-750 calories.  The number of calories in a food can be found on a Nutrition Facts label. If a food does not have a Nutrition Facts label, try to look up the calories online or ask your dietitian for help.  Use your calories on foods and drinks that will fill you up, and not on foods and drinks that will leave you hungry.  Use smaller plates, glasses, and bowls to prevent overeating. This information is not intended to replace advice given to you by your health care provider. Make sure you discuss any questions you have with your health care  provider. Document Revised: 03/08/2018 Document Reviewed: 05/19/2016 Elsevier Patient Education  2020 [employer].  Diabetes Mellitus and Nutrition, Adult When you have diabetes (diabetes mellitus), it is very important to have healthy eating habits because your blood sugar (glucose) levels are greatly affected by what you eat and drink. Eating healthy foods in the appropriate amounts, at about the same times every day, can help you:  Control your blood glucose.  Lower your risk of heart disease.  Improve your blood pressure.  Reach or maintain a healthy weight. Every person with diabetes is different, and each person has different needs for a meal plan. Your health care provider may recommend that you work with a diet and nutrition specialist (dietitian) to make a meal plan that is best for you. Your meal plan may vary depending on factors such as:  The calories you need.  The medicines you take.  Your weight.  Your blood glucose, blood pressure, and cholesterol levels.  Your activity level.  Other health conditions you have, such as heart or kidney disease. How do carbohydrates affect me? Carbohydrates, also called carbs, affect your blood glucose level more than any other type of food. Eating carbs naturally raises the amount of glucose in your blood. Carb counting is a method for keeping track of how many carbs you eat. Counting carbs is important to keep your blood glucose at a healthy level, especially if you use insulin or take certain oral diabetes medicines. It is important to know how many carbs you can safely have in each meal. This is  different for every person. Your dietitian can help you calculate how many carbs you should have at each meal and for each snack. Foods that contain carbs include:  Bread, cereal, rice, pasta, and crackers.  Potatoes and corn.  Peas, beans, and lentils.  Milk and yogurt.  Fruit and juice.  Desserts, such as cakes, cookies, ice  cream, and candy. How does alcohol affect me? Alcohol can cause a sudden decrease in blood glucose (hypoglycemia), especially if you use insulin or take certain oral diabetes medicines. Hypoglycemia can be a life-threatening condition. Symptoms of hypoglycemia (sleepiness, dizziness, and confusion) are similar to symptoms of having too much alcohol. If your health care provider says that alcohol is safe for you, follow these guidelines:  Limit alcohol intake to no more than 1 drink per day for nonpregnant women and 2 drinks per day for men. One drink equals 12 oz of beer, 5 oz of wine, or 1 oz of hard liquor.  Do not drink on an empty stomach.  Keep yourself hydrated with water, diet soda, or unsweetened iced tea.  Keep in mind that regular soda, juice, and other mixers may contain a lot of sugar and must be counted as carbs. What are tips for following this plan?  Reading food labels  Start by checking the serving size on the "Nutrition Facts" label of packaged foods and drinks. The amount of calories, carbs, fats, and other nutrients listed on the label is based on one serving of the item. Many items contain more than one serving per package.  Check the total grams (g) of carbs in one serving. You can calculate the number of servings of carbs in one serving by dividing the total carbs by 15. For example, if a food has 30 g of total carbs, it would be equal to 2 servings of carbs.  Check the number of grams (g) of saturated and trans fats in one serving. Choose foods that have low or no amount of these fats.  Check the number of milligrams (mg) of salt (sodium) in one serving. Most people should limit total sodium intake to less than 2,300 mg per day.  Always check the nutrition information of foods labeled as "low-fat" or "nonfat". These foods may be higher in added sugar or refined carbs and should be avoided.  Talk to your dietitian to identify your daily goals for nutrients listed on  the label. Shopping  Avoid buying canned, premade, or processed foods. These foods tend to be high in fat, sodium, and added sugar.  Shop around the outside edge of the grocery store. This includes fresh fruits and vegetables, bulk grains, fresh meats, and fresh dairy. Cooking  Use low-heat cooking methods, such as baking, instead of high-heat cooking methods like deep frying.  Cook using healthy oils, such as olive, canola, or sunflower oil.  Avoid cooking with butter, cream, or high-fat meats. Meal planning  Eat meals and snacks regularly, preferably at the same times every day. Avoid going long periods of time without eating.  Eat foods high in fiber, such as fresh fruits, vegetables, beans, and whole grains. Talk to your dietitian about how many servings of carbs you can eat at each meal.  Eat 4-6 ounces (oz) of lean protein each day, such as lean meat, chicken, fish, eggs, or tofu. One oz of lean protein is equal to: ? 1 oz of meat, chicken, or fish. ? 1 egg. ?  cup of tofu.  Eat some foods each day that  contain healthy fats, such as avocado, nuts, seeds, and fish. Lifestyle  Check your blood glucose regularly.  Exercise regularly as told by your health care provider. This may include: ? 150 minutes of moderate-intensity or vigorous-intensity exercise each week. This could be brisk walking, biking, or water aerobics. ? Stretching and doing strength exercises, such as yoga or weightlifting, at least 2 times a week.  Take medicines as told by your health care provider.  Do not use any products that contain nicotine or tobacco, such as cigarettes and e-cigarettes. If you need help quitting, ask your health care provider.  Work with a Veterinary surgeon or diabetes educator to identify strategies to manage stress and any emotional and social challenges. Questions to ask a health care provider  Do I need to meet with a diabetes educator?  Do I need to meet with a dietitian?  What  number can I call if I have questions?  When are the best times to check my blood glucose? Where to find more information:  American Diabetes Association: diabetes.org  Academy of Nutrition and Dietetics: www.eatright.[employer] of Diabetes and Digestive and Kidney Diseases (NIH): CarFli[employer].tn Summary  A healthy meal plan will help you control your blood glucose and maintain a healthy lifestyle.  Working with a diet and nutrition specialist (dietitian) can help you make a meal plan that is best for you.  Keep in mind that carbohydrates (carbs) and alcohol have immediate effects on your blood glucose levels. It is important to count carbs and to use alcohol carefully. This information is not intended to replace advice given to you by your health care provider. Make sure you discuss any questions you have with your health care provider. Document Revised: 06/01/2017 Document Reviewed: 07/24/2016 Elsevier Patient Education  2020 [employer]      Edwina Barth, MD [facility]

## 2019-11-27 NOTE — Patient Instructions (Addendum)
   If you have lab work done today you will be contacted with your lab results within the next 2 weeks.  If you have not heard from us then please contact us. The fastest way to get your results is to register for My Chart.   IF you received an x-ray today, you will receive an invoice from [facility]. Please contact [facility] at [phone] with questions or concerns regarding your invoice.   IF you received labwork today, you will receive an invoice from [facility]. Please contact [facility] at [phone] with questions or concerns regarding your invoice.   Our billing staff will not be able to assist you with questions regarding bills from these companies.  You will be contacted with the lab results as soon as they are available. The fastest way to get your results is to activate your My Chart account. Instructions are located on the last page of this paperwork. If you have not heard from us regarding the results in 2 weeks, please contact this office.      Calorie Counting for Weight Loss Calories are units of energy. Your body needs a certain amount of calories from food to keep you going throughout the day. When you eat more calories than your body needs, your body stores the extra calories as fat. When you eat fewer calories than your body needs, your body burns fat to get the energy it needs. Calorie counting means keeping track of how many calories you eat and drink each day. Calorie counting can be helpful if you need to lose weight. If you make sure to eat fewer calories than your body needs, you should lose weight. Ask your health care provider what a healthy weight is for you. For calorie counting to work, you will need to eat the right number of calories in a day in order to lose a healthy amount of weight per week. A dietitian can help you determine how many calories you need in a day and will give you suggestions on how to reach your calorie goal.  A healthy  amount of weight to lose per week is usually 1-2 lb (0.5-0.9 kg). This usually means that your daily calorie intake should be reduced by 500-750 calories.  Eating 1,200 - 1,500 calories per day can help most women lose weight.  Eating 1,500 - 1,800 calories per day can help most men lose weight. What is my plan? My goal is to have __________ calories per day. If I have this many calories per day, I should lose around __________ pounds per week. What do I need to know about calorie counting? In order to meet your daily calorie goal, you will need to:  Find out how many calories are in each food you would like to eat. Try to do this before you eat.  Decide how much of the food you plan to eat.  Write down what you ate and how many calories it had. Doing this is called keeping a food log. To successfully lose weight, it is important to balance calorie counting with a healthy lifestyle that includes regular activity. Aim for 150 minutes of moderate exercise (such as walking) or 75 minutes of vigorous exercise (such as running) each week. Where do I find calorie information?  The number of calories in a food can be found on a Nutrition Facts label. If a food does not have a Nutrition Facts label, try to look up the calories online or ask your dietitian   for help. Remember that calories are listed per serving. If you choose to have more than one serving of a food, you will have to multiply the calories per serving by the amount of servings you plan to eat. For example, the label on a package of bread might say that a serving size is 1 slice and that there are 90 calories in a serving. If you eat 1 slice, you will have eaten 90 calories. If you eat 2 slices, you will have eaten 180 calories. How do I keep a food log? Immediately after each meal, record the following information in your food log:  What you ate. Don't forget to include toppings, sauces, and other extras on the food.  How much you  ate. This can be measured in cups, ounces, or number of items.  How many calories each food and drink had.  The total number of calories in the meal. Keep your food log near you, such as in a small notebook in your pocket, or use a mobile app or website. Some programs will calculate calories for you and show you how many calories you have left for the day to meet your goal. What are some calorie counting tips?   Use your calories on foods and drinks that will fill you up and not leave you hungry: ? Some examples of foods that fill you up are nuts and nut butters, vegetables, lean proteins, and high-fiber foods like whole grains. High-fiber foods are foods with more than 5 g fiber per serving. ? Drinks such as sodas, specialty coffee drinks, alcohol, and juices have a lot of calories, yet do not fill you up.  Eat nutritious foods and avoid empty calories. Empty calories are calories you get from foods or beverages that do not have many vitamins or protein, such as candy, sweets, and soda. It is better to have a nutritious high-calorie food (such as an avocado) than a food with few nutrients (such as a bag of chips).  Know how many calories are in the foods you eat most often. This will help you calculate calorie counts faster.  Pay attention to calories in drinks. Low-calorie drinks include water and unsweetened drinks.  Pay attention to nutrition labels for "low fat" or "fat free" foods. These foods sometimes have the same amount of calories or more calories than the full fat versions. They also often have added sugar, starch, or salt, to make up for flavor that was removed with the fat.  Find a way of tracking calories that works for you. Get creative. Try different apps or programs if writing down calories does not work for you. What are some portion control tips?  Know how many calories are in a serving. This will help you know how many servings of a certain food you can have.  Use a  measuring cup to measure serving sizes. You could also try weighing out portions on a kitchen scale. With time, you will be able to estimate serving sizes for some foods.  Take some time to put servings of different foods on your favorite plates, bowls, and cups so you know what a serving looks like.  Try not to eat straight from a bag or box. Doing this can lead to overeating. Put the amount you would like to eat in a cup or on a plate to make sure you are eating the right portion.  Use smaller plates, glasses, and bowls to prevent overeating.  Try not to multitask (for   example, watch TV or use your computer) while eating. If it is time to eat, sit down at a table and enjoy your food. This will help you to know when you are full. It will also help you to be aware of what you are eating and how much you are eating. What are tips for following this plan? Reading food labels  Check the calorie count compared to the serving size. The serving size may be smaller than what you are used to eating.  Check the source of the calories. Make sure the food you are eating is high in vitamins and protein and low in saturated and trans fats. Shopping  Read nutrition labels while you shop. This will help you make healthy decisions before you decide to purchase your food.  Make a grocery list and stick to it. Cooking  Try to cook your favorite foods in a healthier way. For example, try baking instead of frying.  Use low-fat dairy products. Meal planning  Use more fruits and vegetables. Half of your plate should be fruits and vegetables.  Include lean proteins like poultry and fish. How do I count calories when eating out?  Ask for smaller portion sizes.  Consider sharing an entree and sides instead of getting your own entree.  If you get your own entree, eat only half. Ask for a box at the beginning of your meal and put the rest of your entree in it so you are not tempted to eat it.  If calories  are listed on the menu, choose the lower calorie options.  Choose dishes that include vegetables, fruits, whole grains, low-fat dairy products, and lean protein.  Choose items that are boiled, broiled, grilled, or steamed. Stay away from items that are buttered, battered, fried, or served with cream sauce. Items labeled "crispy" are usually fried, unless stated otherwise.  Choose water, low-fat milk, unsweetened iced tea, or other drinks without added sugar. If you want an alcoholic beverage, choose a lower calorie option such as a glass of wine or light beer.  Ask for dressings, sauces, and syrups on the side. These are usually high in calories, so you should limit the amount you eat.  If you want a salad, choose a garden salad and ask for grilled meats. Avoid extra toppings like bacon, cheese, or fried items. Ask for the dressing on the side, or ask for olive oil and vinegar or lemon to use as dressing.  Estimate how many servings of a food you are given. For example, a serving of cooked rice is  cup or about the size of half a baseball. Knowing serving sizes will help you be aware of how much food you are eating at restaurants. The list below tells you how big or small some common portion sizes are based on everyday objects: ? 1 oz--4 stacked dice. ? 3 oz--1 deck of cards. ? 1 tsp--1 die. ? 1 Tbsp-- a ping-pong ball. ? 2 Tbsp--1 ping-pong ball. ?  cup-- baseball. ? 1 cup--1 baseball. Summary  Calorie counting means keeping track of how many calories you eat and drink each day. If you eat fewer calories than your body needs, you should lose weight.  A healthy amount of weight to lose per week is usually 1-2 lb (0.5-0.9 kg). This usually means reducing your daily calorie intake by 500-750 calories.  The number of calories in a food can be found on a Nutrition Facts label. If a food does not have a Nutrition  Facts label, try to look up the calories online or ask your dietitian for  help.  Use your calories on foods and drinks that will fill you up, and not on foods and drinks that will leave you hungry.  Use smaller plates, glasses, and bowls to prevent overeating. This information is not intended to replace advice given to you by your health care provider. Make sure you discuss any questions you have with your health care provider. Document Revised: 03/08/2018 Document Reviewed: 05/19/2016 Elsevier Patient Education  [place].  Diabetes Mellitus and Nutrition, Adult When you have diabetes (diabetes mellitus), it is very important to have healthy eating habits because your blood sugar (glucose) levels are greatly affected by what you eat and drink. Eating healthy foods in the appropriate amounts, at about the same times every day, can help you:  Control your blood glucose.  Lower your risk of heart disease.  Improve your blood pressure.  Reach or maintain a healthy weight. Every person with diabetes is different, and each person has different needs for a meal plan. Your health care provider may recommend that you work with a diet and nutrition specialist (dietitian) to make a meal plan that is best for you. Your meal plan may vary depending on factors such as:  The calories you need.  The medicines you take.  Your weight.  Your blood glucose, blood pressure, and cholesterol levels.  Your activity level.  Other health conditions you have, such as heart or kidney disease. How do carbohydrates affect me? Carbohydrates, also called carbs, affect your blood glucose level more than any other type of food. Eating carbs naturally raises the amount of glucose in your blood. Carb counting is a method for keeping track of how many carbs you eat. Counting carbs is important to keep your blood glucose at a healthy level, especially if you use insulin or take certain oral diabetes medicines. It is important to know how many carbs you can safely have in each meal.  This is different for every person. Your dietitian can help you calculate how many carbs you should have at each meal and for each snack. Foods that contain carbs include:  Bread, cereal, rice, pasta, and crackers.  Potatoes and corn.  Peas, beans, and lentils.  Milk and yogurt.  Fruit and juice.  Desserts, such as cakes, cookies, ice cream, and candy. How does alcohol affect me? Alcohol can cause a sudden decrease in blood glucose (hypoglycemia), especially if you use insulin or take certain oral diabetes medicines. Hypoglycemia can be a life-threatening condition. Symptoms of hypoglycemia (sleepiness, dizziness, and confusion) are similar to symptoms of having too much alcohol. If your health care provider says that alcohol is safe for you, follow these guidelines:  Limit alcohol intake to no more than 1 drink per day for nonpregnant women and 2 drinks per day for men. One drink equals 12 oz of beer, 5 oz of wine, or 1 oz of hard liquor.  Do not drink on an empty stomach.  Keep yourself hydrated with water, diet soda, or unsweetened iced tea.  Keep in mind that regular soda, juice, and other mixers may contain a lot of sugar and must be counted as carbs. What are tips for following this plan?  Reading food labels  Start by checking the serving size on the "Nutrition Facts" label of packaged foods and drinks. The amount of calories, carbs, fats, and other nutrients listed on the label is based on one serving  of the item. Many items contain more than one serving per package.  Check the total grams (g) of carbs in one serving. You can calculate the number of servings of carbs in one serving by dividing the total carbs by 15. For example, if a food has 30 g of total carbs, it would be equal to 2 servings of carbs.  Check the number of grams (g) of saturated and trans fats in one serving. Choose foods that have low or no amount of these fats.  Check the number of milligrams (mg) of  salt (sodium) in one serving. Most people should limit total sodium intake to less than 2,300 mg per day.  Always check the nutrition information of foods labeled as "low-fat" or "nonfat". These foods may be higher in added sugar or refined carbs and should be avoided.  Talk to your dietitian to identify your daily goals for nutrients listed on the label. Shopping  Avoid buying canned, premade, or processed foods. These foods tend to be high in fat, sodium, and added sugar.  Shop around the outside edge of the grocery store. This includes fresh fruits and vegetables, bulk grains, fresh meats, and fresh dairy. Cooking  Use low-heat cooking methods, such as baking, instead of high-heat cooking methods like deep frying.  Cook using healthy oils, such as olive, canola, or sunflower oil.  Avoid cooking with butter, cream, or high-fat meats. Meal planning  Eat meals and snacks regularly, preferably at the same times every day. Avoid going long periods of time without eating.  Eat foods high in fiber, such as fresh fruits, vegetables, beans, and whole grains. Talk to your dietitian about how many servings of carbs you can eat at each meal.  Eat 4-6 ounces (oz) of lean protein each day, such as lean meat, chicken, fish, eggs, or tofu. One oz of lean protein is equal to: ? 1 oz of meat, chicken, or fish. ? 1 egg. ?  cup of tofu.  Eat some foods each day that contain healthy fats, such as avocado, nuts, seeds, and fish. Lifestyle  Check your blood glucose regularly.  Exercise regularly as told by your health care provider. This may include: ? 150 minutes of moderate-intensity or vigorous-intensity exercise each week. This could be brisk walking, biking, or water aerobics. ? Stretching and doing strength exercises, such as yoga or weightlifting, at least 2 times a week.  Take medicines as told by your health care provider.  Do not use any products that contain nicotine or tobacco, such  as cigarettes and e-cigarettes. If you need help quitting, ask your health care provider.  Work with a Social worker or diabetes educator to identify strategies to manage stress and any emotional and social challenges. Questions to ask a health care provider  Do I need to meet with a diabetes educator?  Do I need to meet with a dietitian?  What number can I call if I have questions?  When are the best times to check my blood glucose? Where to find more information:  American Diabetes Association: diabetes.org  Academy of Nutrition and Dietetics: www.eatright.[employer] of Diabetes and Digestive and Kidney Diseases (NIH): [employer] Summary  A healthy meal plan will help you control your blood glucose and maintain a healthy lifestyle.  Working with a diet and nutrition specialist (dietitian) can help you make a meal plan that is best for you.  Keep in mind that carbohydrates (carbs) and alcohol have immediate effects on your blood glucose  levels. It is important to count carbs and to use alcohol carefully. This information is not intended to replace advice given to you by your health care provider. Make sure you discuss any questions you have with your health care provider. Document Revised: 06/01/2017 Document Reviewed: 07/24/2016 Elsevier Patient Education  [employer].     If you have lab work done today you will be contacted with your lab results within the next 2 weeks.  If you have not heard from [place] then please contact us. The fastest way to get your results is to register for My Chart.   IF you received an x-ray today, you will receive an invoice from [facility]. Please contact [facility] at [phone] with questions or concerns regarding your invoice.   IF you received labwork today, you will receive an invoice from [place]. Please contact [facility] at [phone] with questions or concerns regarding your invoice.   Our  billing staff will not be able to assist you with questions regarding bills from these companies.  You will be contacted with the lab results as soon as they are available. The fastest way to get your results is to activate your My Chart account. Instructions are located on the last page of this paperwork. If you have not heard from [place] regarding the results in 2 weeks, please contact this office.

## 2019-11-28 LAB — COMPREHENSIVE METABOLIC PANEL
ALT: 16 IU/L (ref 0–44)
AST: 20 IU/L (ref 0–40)
Albumin/Globulin Ratio: 1.6 (ref 1.2–2.2)
Albumin: 4.4 g/dL (ref 4.0–5.0)
Alkaline Phosphatase: 69 IU/L (ref 48–121)
BUN/Creatinine Ratio: 10 (ref 9–20)
BUN: 13 mg/dL (ref 6–20)
Bilirubin Total: 0.3 mg/dL (ref 0.0–1.2)
CO2: 20 mmol/L (ref 20–29)
Calcium: 9.9 mg/dL (ref 8.7–10.2)
Chloride: 105 mmol/L (ref 96–106)
Creatinine, Ser: 1.34 mg/dL — ABNORMAL HIGH (ref 0.76–1.27)
GFR calc Af Amer: 78 mL/min/{1.73_m2} (ref >59)
GFR calc non Af Amer: 67 mL/min/{1.73_m2} (ref >59)
Globulin, Total: 2.8 g/dL (ref 1.5–4.5)
Glucose: 87 mg/dL (ref 65–99)
Potassium: 4 mmol/L (ref 3.5–5.2)
Sodium: 143 mmol/L (ref 134–144)
Total Protein: 7.2 g/dL (ref 6.0–8.5)

## 2019-11-28 LAB — CBC WITH DIFFERENTIAL/PLATELET
Basophils Absolute: 0 10*3/uL (ref 0.0–0.2)
Basos: 1 %
EOS (ABSOLUTE): 0.2 10*3/uL (ref 0.0–0.4)
Eos: 4 %
Hematocrit: 47.3 % (ref 37.5–51.0)
Hemoglobin: 15.5 g/dL (ref 13.0–17.7)
Immature Grans (Abs): 0 10*3/uL (ref 0.0–0.1)
Immature Granulocytes: 0 %
Lymphocytes Absolute: 2.2 10*3/uL (ref 0.7–3.1)
Lymphs: 35 %
MCH: 28.9 pg (ref 26.6–33.0)
MCHC: 32.8 g/dL (ref 31.5–35.7)
MCV: 88 fL (ref 79–97)
Monocytes Absolute: 0.7 10*3/uL (ref 0.1–0.9)
Monocytes: 11 %
Neutrophils Absolute: 3.2 10*3/uL (ref 1.4–7.0)
Neutrophils: 49 %
Platelets: 118 10*3/uL — ABNORMAL LOW (ref 150–450)
RBC: 5.37 x10E6/uL (ref 4.14–5.80)
RDW: 13.8 % (ref 11.6–15.4)
WBC: 6.3 10*3/uL (ref 3.4–10.8)

## 2019-11-28 LAB — LIPID PANEL
Chol/HDL Ratio: 2.9 ratio (ref 0.0–5.0)
Cholesterol, Total: 180 mg/dL (ref 100–199)
HDL: 63 mg/dL (ref >39)
LDL Chol Calc (NIH): 100 mg/dL — ABNORMAL HIGH (ref 0–99)
Triglycerides: 94 mg/dL (ref 0–149)
VLDL Cholesterol Cal: 17 mg/dL (ref 5–40)

## 2019-11-28 LAB — SAR COV2 SEROLOGY (COVID19)AB(IGG),IA: DiaSorin SARS-CoV-2 Ab, IgG: POSITIVE

## 2020-03-01 ENCOUNTER — Other Ambulatory Visit: Payer: Self-pay

## 2020-03-01 ENCOUNTER — Ambulatory Visit (INDEPENDENT_AMBULATORY_CARE_PROVIDER_SITE_OTHER): Payer: 59 | Admitting: Emergency Medicine

## 2020-03-01 ENCOUNTER — Encounter: Payer: Self-pay | Admitting: Emergency Medicine

## 2020-03-01 VITALS — BP 135/87 | HR 94 | Temp 98.2°F | Resp 16 | Ht 70.5 in | Wt 361.0 lb

## 2020-03-01 DIAGNOSIS — E119 Type 2 diabetes mellitus without complications: Secondary | ICD-10-CM

## 2020-03-01 DIAGNOSIS — Z6841 Body Mass Index (BMI) 40.0 and over, adult: Secondary | ICD-10-CM | POA: Diagnosis not present

## 2020-03-01 DIAGNOSIS — I10 Essential (primary) hypertension: Secondary | ICD-10-CM | POA: Diagnosis not present

## 2020-03-01 DIAGNOSIS — E1159 Type 2 diabetes mellitus with other circulatory complications: Secondary | ICD-10-CM

## 2020-03-01 DIAGNOSIS — Z111 Encounter for screening for respiratory tuberculosis: Secondary | ICD-10-CM

## 2020-03-01 LAB — POCT GLYCOSYLATED HEMOGLOBIN (HGB A1C): Hemoglobin A1C: 6.2 % — AB (ref 4.0–5.6)

## 2020-03-01 LAB — GLUCOSE, POCT (MANUAL RESULT ENTRY): POC Glucose: 53 mg/dl — AB (ref 70–99)

## 2020-03-01 MED ORDER — GLIPIZIDE 5 MG PO TABS: 5.0000 mg | ORAL_TABLET | Freq: Two times a day (BID) | ORAL | 3 refills | Status: DC

## 2020-03-01 MED ORDER — ROSUVASTATIN CALCIUM 10 MG PO TABS: 10.0000 mg | ORAL_TABLET | Freq: Every day | ORAL | 3 refills | Status: DC

## 2020-03-01 NOTE — Patient Instructions (Addendum)
   If you have lab work done today you will be contacted with your lab results within the next 2 weeks.  If you have not heard from us then please contact us. The fastest way to get your results is to register for My Chart.   IF you received an x-ray today, you will receive an invoice from [facility]. Please contact [facility] at [phone] with questions or concerns regarding your invoice.   IF you received labwork today, you will receive an invoice from [facility]. Please contact [facility] at [phone] with questions or concerns regarding your invoice.   Our billing staff will not be able to assist you with questions regarding bills from these companies.  You will be contacted with the lab results as soon as they are available. The fastest way to get your results is to activate your My Chart account. Instructions are located on the last page of this paperwork. If you have not heard from us regarding the results in 2 weeks, please contact this office.      Diabetes Mellitus and Nutrition, Adult When you have diabetes (diabetes mellitus), it is very important to have healthy eating habits because your blood sugar (glucose) levels are greatly affected by what you eat and drink. Eating healthy foods in the appropriate amounts, at about the same times every day, can help you:  Control your blood glucose.  Lower your risk of heart disease.  Improve your blood pressure.  Reach or maintain a healthy weight. Every person with diabetes is different, and each person has different needs for a meal plan. Your health care provider may recommend that you work with a diet and nutrition specialist (dietitian) to make a meal plan that is best for you. Your meal plan may vary depending on factors such as:  The calories you need.  The medicines you take.  Your weight.  Your blood glucose, blood pressure, and cholesterol levels.  Your activity level.  Other health  conditions you have, such as heart or kidney disease. How do carbohydrates affect me? Carbohydrates, also called carbs, affect your blood glucose level more than any other type of food. Eating carbs naturally raises the amount of glucose in your blood. Carb counting is a method for keeping track of how many carbs you eat. Counting carbs is important to keep your blood glucose at a healthy level, especially if you use insulin or take certain oral diabetes medicines. It is important to know how many carbs you can safely have in each meal. This is different for every person. Your dietitian can help you calculate how many carbs you should have at each meal and for each snack. Foods that contain carbs include:  Bread, cereal, rice, pasta, and crackers.  Potatoes and corn.  Peas, beans, and lentils.  Milk and yogurt.  Fruit and juice.  Desserts, such as cakes, cookies, ice cream, and candy. How does alcohol affect me? Alcohol can cause a sudden decrease in blood glucose (hypoglycemia), especially if you use insulin or take certain oral diabetes medicines. Hypoglycemia can be a life-threatening condition. Symptoms of hypoglycemia (sleepiness, dizziness, and confusion) are similar to symptoms of having too much alcohol. If your health care provider says that alcohol is safe for you, follow these guidelines:  Limit alcohol intake to no more than 1 drink per day for nonpregnant women and 2 drinks per day for men. One drink equals 12 oz of beer, 5 oz of wine, or 1 oz of hard   liquor.  Do not drink on an empty stomach.  Keep yourself hydrated with water, diet soda, or unsweetened iced tea.  Keep in mind that regular soda, juice, and other mixers may contain a lot of sugar and must be counted as carbs. What are tips for following this plan?  Reading food labels  Start by checking the serving size on the "Nutrition Facts" label of packaged foods and drinks. The amount of calories, carbs, fats, and  other nutrients listed on the label is based on one serving of the item. Many items contain more than one serving per package.  Check the total grams (g) of carbs in one serving. You can calculate the number of servings of carbs in one serving by dividing the total carbs by 15. For example, if a food has 30 g of total carbs, it would be equal to 2 servings of carbs.  Check the number of grams (g) of saturated and trans fats in one serving. Choose foods that have low or no amount of these fats.  Check the number of milligrams (mg) of salt (sodium) in one serving. Most people should limit total sodium intake to less than 2,300 mg per day.  Always check the nutrition information of foods labeled as "low-fat" or "nonfat". These foods may be higher in added sugar or refined carbs and should be avoided.  Talk to your dietitian to identify your daily goals for nutrients listed on the label. Shopping  Avoid buying canned, premade, or processed foods. These foods tend to be high in fat, sodium, and added sugar.  Shop around the outside edge of the grocery store. This includes fresh fruits and vegetables, bulk grains, fresh meats, and fresh dairy. Cooking  Use low-heat cooking methods, such as baking, instead of high-heat cooking methods like deep frying.  Cook using healthy oils, such as olive, canola, or sunflower oil.  Avoid cooking with butter, cream, or high-fat meats. Meal planning  Eat meals and snacks regularly, preferably at the same times every day. Avoid going long periods of time without eating.  Eat foods high in fiber, such as fresh fruits, vegetables, beans, and whole grains. Talk to your dietitian about how many servings of carbs you can eat at each meal.  Eat 4-6 ounces (oz) of lean protein each day, such as lean meat, chicken, fish, eggs, or tofu. One oz of lean protein is equal to: ? 1 oz of meat, chicken, or fish. ? 1 egg. ?  cup of tofu.  Eat some foods each day that  contain healthy fats, such as avocado, nuts, seeds, and fish. Lifestyle  Check your blood glucose regularly.  Exercise regularly as told by your health care provider. This may include: ? 150 minutes of moderate-intensity or vigorous-intensity exercise each week. This could be brisk walking, biking, or water aerobics. ? Stretching and doing strength exercises, such as yoga or weightlifting, at least 2 times a week.  Take medicines as told by your health care provider.  Do not use any products that contain nicotine or tobacco, such as cigarettes and e-cigarettes. If you need help quitting, ask your health care provider.  Work with a counselor or diabetes educator to identify strategies to manage stress and any emotional and social challenges. Questions to ask a health care provider  Do I need to meet with a diabetes educator?  Do I need to meet with a dietitian?  What number can I call if I have questions?  When are the best   times to check my blood glucose? Where to find more information:  American Diabetes Association: diabetes.org  Academy of Nutrition and Dietetics: www.eatright.org  National Institute of Diabetes and Digestive and Kidney Diseases (NIH): www.niddk.nih.gov Summary  A healthy meal plan will help you control your blood glucose and maintain a healthy lifestyle.  Working with a diet and nutrition specialist (dietitian) can help you make a meal plan that is best for you.  Keep in mind that carbohydrates (carbs) and alcohol have immediate effects on your blood glucose levels. It is important to count carbs and to use alcohol carefully. This information is not intended to replace advice given to you by your health care provider. Make sure you discuss any questions you have with your health care provider. Document Revised: 06/01/2017 Document Reviewed: 07/24/2016 Elsevier Patient Education  2020 Elsevier Inc.  

## 2020-03-01 NOTE — Progress Notes (Signed)
Mark Schneider 39 y.o.   Chief Complaint  Patient presents with   Diabetes    3 month   Medication Refill    Rosuvastatin and Glipizide    HISTORY OF PRESENT ILLNESS: This is a 39 y.o. male with history of diabetes here for 98-month follow-up and medication refill. Presently taking Metformin 1000 mg twice a day, glipizide 5 mg twice a day, and rosuvastatin 10 mg daily. Doing well.  Has no complaints. Also brought in application for employment form that needs to be filled out with proof of tuberculosis screening test.  HPI   Prior to Admission medications   Medication Sig Start Date End Date Taking? Authorizing Provider  dicyclomine (BENTYL) 20 MG tablet Take 1 tablet (20 mg total) by mouth 2 (two) times daily. 12/08/18  Yes McDonald, Mia A, PA-C  metFORMIN (GLUCOPHAGE) 1000 MG tablet Take 1 tablet (1,000 mg total) by mouth 2 (two) times daily with a meal. 11/27/19  Yes Lary Eckardt, Eilleen Kempf, MD  rosuvastatin (CRESTOR) 10 MG tablet Take 1 tablet (10 mg total) by mouth daily. 03/01/20  Yes Shakim Faith, Eilleen Kempf, MD  glipiZIDE (GLUCOTROL) 5 MG tablet Take 1 tablet (5 mg total) by mouth 2 (two) times daily before a meal. 03/01/20 05/30/20  Katisha Shimizu, Eilleen Kempf, MD  olmesartan (BENICAR) 20 MG tablet Take 1 tablet (20 mg total) by mouth daily. 11/27/19 02/25/20  Georgina Quint, MD  ondansetron (ZOFRAN ODT) 4 MG disintegrating tablet Take 1 tablet (4 mg total) by mouth every 8 (eight) hours as needed. Patient not taking: Reported on 03/01/2020 12/08/18   Frederik Pear A, PA-C    Allergies  Allergen Reactions   Sheep-Derived Products Swelling   Shellfish Allergy Swelling    Patient Active Problem List   Diagnosis Date Noted   Hypertension associated with diabetes (HCC) 11/27/2019   History of COVID-19 11/27/2019   New onset type 2 diabetes mellitus (HCC) 11/15/2017   Acute renal failure (ARF) (HCC) 11/15/2017   Body mass index (BMI) of 50-59.9 in adult [facility]) 11/15/2017     Past Medical History:  Diagnosis Date   Diabetes mellitus without complication (HCC)     Past Surgical History:  Procedure Laterality Date   DENTAL SURGERY     TOE SURGERY      Social History   Socioeconomic History   Marital status: Single    Spouse name: Not on file   Number of children: Not on file   Years of education: Not on file   Highest education level: Not on file  Occupational History   Occupation: Cook for [employer]  Tobacco Use   Smoking status: Never Smoker   Smokeless tobacco: Never Used  Vaping Use   Vaping Use: Never used  Substance and Sexual Activity   Alcohol use: No   Drug use: No   Sexual activity: Not Currently  Other Topics Concern   Not on file  Social History Narrative   Not on file   Social Determinants of Health   Financial Resource Strain:    Difficulty of Paying Living Expenses: Not on file  Food Insecurity:    Worried About Programme researcher, broadcasting/film/video in the Last Year: Not on file   [employer] of Food in the Last Year: Not on file  Transportation Needs:    Lack of Transportation (Medical): Not on file   Lack of Transportation (Non-Medical): Not on file  Physical Activity:    Days of Exercise per Week: Not on file  Minutes of Exercise per Session: Not on file  Stress:    Feeling of Stress : Not on file  Social Connections:    Frequency of Communication with Friends and Family: Not on file   Frequency of Social Gatherings with Friends and Family: Not on file   Attends Religious Services: Not on file   Active Member of Clubs or Organizations: Not on file   Attends BankerClub or Organization Meetings: Not on file   Marital Status: Not on file  Intimate Partner Violence:    Fear of Current or Ex-Partner: Not on file   Emotionally Abused: Not on file   Physically Abused: Not on file   Sexually Abused: Not on file    Family History  Problem Relation Age of Onset   Diabetes Mother    Diabetes Sister     Diabetes Maternal Grandmother    Diabetes Maternal Uncle    Diabetes Maternal Grandfather      Review of Systems  Constitutional: Negative.  Negative for chills and fever.  HENT: Negative.  Negative for congestion and sore throat.   Respiratory: Negative.  Negative for cough and shortness of breath.   Cardiovascular: Negative.  Negative for chest pain and palpitations.  Gastrointestinal: Negative.  Negative for abdominal pain, nausea and vomiting.  Genitourinary: Negative.  Negative for dysuria and hematuria.  Skin: Negative.  Negative for rash.  Neurological: Negative.  Negative for dizziness and headaches.  All other systems reviewed and are negative.   Today's Vitals   03/01/20 1335  BP: 135/87  Pulse: 94  Resp: 16  Temp: 98.2 F (36.8 C)  TempSrc: Temporal  SpO2: 94%  Weight: (!) 361 lb (163.7 kg)  Height: 5' 10.5" (1.791 m)   Body mass index is 51.07 kg/m.  Physical Exam Vitals reviewed.  Constitutional:      Appearance: Normal appearance. He is obese.  HENT:     Head: Normocephalic.  Eyes:     Extraocular Movements: Extraocular movements intact.     Conjunctiva/sclera: Conjunctivae normal.     Pupils: Pupils are equal, round, and reactive to light.  Cardiovascular:     Rate and Rhythm: Normal rate and regular rhythm.     Pulses: Normal pulses.     Heart sounds: Normal heart sounds.  Pulmonary:     Effort: Pulmonary effort is normal.     Breath sounds: Normal breath sounds.  Abdominal:     General: There is no distension.     Palpations: Abdomen is soft.     Tenderness: There is no abdominal tenderness.  Musculoskeletal:        General: Normal range of motion.     Cervical back: Normal range of motion and neck supple.  Skin:    General: Skin is warm and dry.     Capillary Refill: Capillary refill takes less than 2 seconds.  Neurological:     General: No focal deficit present.     Mental Status: He is alert and oriented to person, place, and  time.  Psychiatric:        Mood and Affect: Mood normal.        Behavior: Behavior normal.    Results for orders placed or performed in visit on 03/01/20 (from the past 24 hour(s))  POCT glucose (manual entry)     Status: Abnormal   Collection Time: 03/01/20  1:51 PM  Result Value Ref Range   POC Glucose 53 (A) 70 - 99 mg/dl  POCT glycosylated hemoglobin (Hb  A1C)     Status: Abnormal   Collection Time: 03/01/20  1:53 PM  Result Value Ref Range   Hemoglobin A1C 6.2 (A) 4.0 - 5.6 %   HbA1c POC (<> result, manual entry)     HbA1c, POC (prediabetic range)     HbA1c, POC (controlled diabetic range)       ASSESSMENT & PLAN: Hypertension associated with diabetes (HCC) Well-controlled hypertension.  Continue present medication.  No changes. Well-controlled diabetes with much improved hemoglobin A1c at 6.2.  Continue present medications.  No changes. Diet and nutrition discussed. Continue statin therapy. Follow-up in 3 months.  Elery was seen today for diabetes and medication refill.  Diagnoses and all orders for this visit:  Diabetes mellitus, new onset (HCC) -     POCT glucose (manual entry) -     POCT glycosylated hemoglobin (Hb A1C)  Hypertension associated with diabetes (HCC) -     CBC with Differential/Platelet -     Comprehensive metabolic panel -     Lipid panel -     rosuvastatin (CRESTOR) 10 MG tablet; Take 1 tablet (10 mg total) by mouth daily. -     glipiZIDE (GLUCOTROL) 5 MG tablet; Take 1 tablet (5 mg total) by mouth 2 (two) times daily before a meal.  Body mass index (BMI) of 50-59.9 in adult [facility])  Tuberculosis screening -     TB Skin Test    Patient Instructions       If you have lab work done today you will be contacted with your lab results within the next 2 weeks.  If you have not heard from [place] then please contact us. The fastest way to get your results is to register for My Chart.   IF you received an x-ray today, you will receive an invoice  from [facility]. Please contact [facility] at [phone] with questions or concerns regarding your invoice.   IF you received labwork today, you will receive an invoice from [facility]. Please contact [facility] at [phone] with questions or concerns regarding your invoice.   Our billing staff will not be able to assist you with questions regarding bills from these companies.  You will be contacted with the lab results as soon as they are available. The fastest way to get your results is to activate your My Chart account. Instructions are located on the last page of this paperwork. If you have not heard from [place] regarding the results in 2 weeks, please contact this office.     Diabetes Mellitus and Nutrition, Adult When you have diabetes (diabetes mellitus), it is very important to have healthy eating habits because your blood sugar (glucose) levels are greatly affected by what you eat and drink. Eating healthy foods in the appropriate amounts, at about the same times every day, can help you:  Control your blood glucose.  Lower your risk of heart disease.  Improve your blood pressure.  Reach or maintain a healthy weight. Every person with diabetes is different, and each person has different needs for a meal plan. Your health care provider may recommend that you work with a diet and nutrition specialist (dietitian) to make a meal plan that is best for you. Your meal plan may vary depending on factors such as:  The calories you need.  The medicines you take.  Your weight.  Your blood glucose, blood pressure, and cholesterol levels.  Your activity level.  Other health conditions you have, such as heart or kidney disease. How  do carbohydrates affect me? Carbohydrates, also called carbs, affect your blood glucose level more than any other type of food. Eating carbs naturally raises the amount of glucose in your blood. Carb counting is a method for keeping track of  how many carbs you eat. Counting carbs is important to keep your blood glucose at a healthy level, especially if you use insulin or take certain oral diabetes medicines. It is important to know how many carbs you can safely have in each meal. This is different for every person. Your dietitian can help you calculate how many carbs you should have at each meal and for each snack. Foods that contain carbs include:  Bread, cereal, rice, pasta, and crackers.  Potatoes and corn.  Peas, beans, and lentils.  Milk and yogurt.  Fruit and juice.  Desserts, such as cakes, cookies, ice cream, and candy. How does alcohol affect me? Alcohol can cause a sudden decrease in blood glucose (hypoglycemia), especially if you use insulin or take certain oral diabetes medicines. Hypoglycemia can be a life-threatening condition. Symptoms of hypoglycemia (sleepiness, dizziness, and confusion) are similar to symptoms of having too much alcohol. If your health care provider says that alcohol is safe for you, follow these guidelines:  Limit alcohol intake to no more than 1 drink per day for nonpregnant women and 2 drinks per day for men. One drink equals 12 oz of beer, 5 oz of wine, or 1 oz of hard liquor.  Do not drink on an empty stomach.  Keep yourself hydrated with water, diet soda, or unsweetened iced tea.  Keep in mind that regular soda, juice, and other mixers may contain a lot of sugar and must be counted as carbs. What are tips for following this plan?  Reading food labels  Start by checking the serving size on the "Nutrition Facts" label of packaged foods and drinks. The amount of calories, carbs, fats, and other nutrients listed on the label is based on one serving of the item. Many items contain more than one serving per package.  Check the total grams (g) of carbs in one serving. You can calculate the number of servings of carbs in one serving by dividing the total carbs by 15. For example, if a  food has 30 g of total carbs, it would be equal to 2 servings of carbs.  Check the number of grams (g) of saturated and trans fats in one serving. Choose foods that have low or no amount of these fats.  Check the number of milligrams (mg) of salt (sodium) in one serving. Most people should limit total sodium intake to less than 2,300 mg per day.  Always check the nutrition information of foods labeled as "low-fat" or "nonfat". These foods may be higher in added sugar or refined carbs and should be avoided.  Talk to your dietitian to identify your daily goals for nutrients listed on the label. Shopping  Avoid buying canned, premade, or processed foods. These foods tend to be high in fat, sodium, and added sugar.  Shop around the outside edge of the grocery store. This includes fresh fruits and vegetables, bulk grains, fresh meats, and fresh dairy. Cooking  Use low-heat cooking methods, such as baking, instead of high-heat cooking methods like deep frying.  Cook using healthy oils, such as olive, canola, or sunflower oil.  Avoid cooking with butter, cream, or high-fat meats. Meal planning  Eat meals and snacks regularly, preferably at the same times every day. Avoid going long  periods of time without eating.  Eat foods high in fiber, such as fresh fruits, vegetables, beans, and whole grains. Talk to your dietitian about how many servings of carbs you can eat at each meal.  Eat 4-6 ounces (oz) of lean protein each day, such as lean meat, chicken, fish, eggs, or tofu. One oz of lean protein is equal to: ? 1 oz of meat, chicken, or fish. ? 1 egg. ?  cup of tofu.  Eat some foods each day that contain healthy fats, such as avocado, nuts, seeds, and fish. Lifestyle  Check your blood glucose regularly.  Exercise regularly as told by your health care provider. This may include: ? 150 minutes of moderate-intensity or vigorous-intensity exercise each week. This could be brisk walking,  biking, or water aerobics. ? Stretching and doing strength exercises, such as yoga or weightlifting, at least 2 times a week.  Take medicines as told by your health care provider.  Do not use any products that contain nicotine or tobacco, such as cigarettes and e-cigarettes. If you need help quitting, ask your health care provider.  Work with a Veterinary surgeon or diabetes educator to identify strategies to manage stress and any emotional and social challenges. Questions to ask a health care provider  Do I need to meet with a diabetes educator?  Do I need to meet with a dietitian?  What number can I call if I have questions?  When are the best times to check my blood glucose? Where to find more information:  American Diabetes Association: diabetes.org  Academy of Nutrition and Dietetics: www.eatright.[employer] of Diabetes and Digestive and Kidney Diseases (NIH): CarFli[employer].tn Summary  A healthy meal plan will help you control your blood glucose and maintain a healthy lifestyle.  Working with a diet and nutrition specialist (dietitian) can help you make a meal plan that is best for you.  Keep in mind that carbohydrates (carbs) and alcohol have immediate effects on your blood glucose levels. It is important to count carbs and to use alcohol carefully. This information is not intended to replace advice given to you by your health care provider. Make sure you discuss any questions you have with your health care provider. Document Revised: 06/01/2017 Document Reviewed: 07/24/2016 Elsevier Patient Education  2020 Elsevier Inc.      Edwina Barth, MD [facility]

## 2020-03-01 NOTE — Progress Notes (Signed)
  Tuberculosis Risk Questionnaire  1. No Were you born outside the [place] in one of the following parts of the world: [place], [place], [place], [place] or [place]?    2. No Have you traveled outside the [place] and lived for more than one month in one of the following parts of the world: [place], [place], [place], [place] or [place]?    3. Yes  Do you have a compromised immune system such as from any of the following conditions:HIV/AIDS, organ or bone marrow transplantation, diabetes, immunosuppressive medicines (e.g. Prednisone, Remicaide), leukemia, lymphoma, cancer of the head or neck, gastrectomy or jejunal bypass, end-stage renal disease (on dialysis), or silicosis?      4. No Have you ever or do you plan on working in: a residential care center, a health care facility, a jail or prison or homeless shelter?    5. No Have you ever: injected illegal drugs, used crack cocaine, lived in a homeless shelter  or been in jail or prison?     6. No Have you ever been exposed to anyone with infectious tuberculosis?  7. No Have you ever had a BCG vaccine? (BCG is a vaccine for tuberculosis  (TB) used in OTHER countries, NOT in the [place]).  8. No Have you ever been advised by a health care provider NOT to have a TB skin test?  9. No Have you ever had a POSITIVE TB skin test?  IF SO, when? n/a  IF SO, were you treated with INH? n/a  IF SO, where? n/a  Tuberculosis Symptom Questionnaire  Do you currently have any of the following symptoms?  1. No Unexplained cough lasting more than 3 weeks?   2. No Unexplained fever lasting more than 3 weeks.   3. No Night Sweats (sweating that leaves the bedclothes and sheets wet)     4. No Shortness of Breath   5. No Chest Pain   6. No Unintentional weight loss    7. No Unexplained fatigue (very tired for no reason)   

## 2020-03-01 NOTE — Assessment & Plan Note (Signed)
Well-controlled hypertension.  Continue present medication.  No changes. Well-controlled diabetes with much improved hemoglobin A1c at 6.2.  Continue present medications.  No changes. Diet and nutrition discussed. Continue statin therapy. Follow-up in 3 months.

## 2020-03-02 LAB — CBC WITH DIFFERENTIAL/PLATELET
Basophils Absolute: 0 10*3/uL (ref 0.0–0.2)
Basos: 1 %
EOS (ABSOLUTE): 0.2 10*3/uL (ref 0.0–0.4)
Eos: 3 %
Hematocrit: 45.1 % (ref 37.5–51.0)
Hemoglobin: 15.2 g/dL (ref 13.0–17.7)
Immature Grans (Abs): 0 10*3/uL (ref 0.0–0.1)
Immature Granulocytes: 0 %
Lymphocytes Absolute: 1.8 10*3/uL (ref 0.7–3.1)
Lymphs: 35 %
MCH: 30.1 pg (ref 26.6–33.0)
MCHC: 33.7 g/dL (ref 31.5–35.7)
MCV: 89 fL (ref 79–97)
Monocytes Absolute: 0.5 10*3/uL (ref 0.1–0.9)
Monocytes: 10 %
Neutrophils Absolute: 2.7 10*3/uL (ref 1.4–7.0)
Neutrophils: 51 %
Platelets: 202 10*3/uL (ref 150–450)
RBC: 5.05 x10E6/uL (ref 4.14–5.80)
RDW: 13.4 % (ref 11.6–15.4)
WBC: 5.3 10*3/uL (ref 3.4–10.8)

## 2020-03-02 LAB — LIPID PANEL
Chol/HDL Ratio: 2.9 ratio (ref 0.0–5.0)
Cholesterol, Total: 154 mg/dL (ref 100–199)
HDL: 54 mg/dL (ref >39)
LDL Chol Calc (NIH): 85 mg/dL (ref 0–99)
Triglycerides: 76 mg/dL (ref 0–149)
VLDL Cholesterol Cal: 15 mg/dL (ref 5–40)

## 2020-03-02 LAB — COMPREHENSIVE METABOLIC PANEL
ALT: 15 IU/L (ref 0–44)
AST: 14 IU/L (ref 0–40)
Albumin/Globulin Ratio: 1.5 (ref 1.2–2.2)
Albumin: 4.5 g/dL (ref 4.0–5.0)
Alkaline Phosphatase: 57 IU/L (ref 48–121)
BUN/Creatinine Ratio: 9 (ref 9–20)
BUN: 14 mg/dL (ref 6–20)
Bilirubin Total: 0.3 mg/dL (ref 0.0–1.2)
CO2: 24 mmol/L (ref 20–29)
Calcium: 9.5 mg/dL (ref 8.7–10.2)
Chloride: 107 mmol/L — ABNORMAL HIGH (ref 96–106)
Creatinine, Ser: 1.52 mg/dL — ABNORMAL HIGH (ref 0.76–1.27)
GFR calc Af Amer: 66 mL/min/{1.73_m2} (ref >59)
GFR calc non Af Amer: 57 mL/min/{1.73_m2} — ABNORMAL LOW (ref >59)
Globulin, Total: 3 g/dL (ref 1.5–4.5)
Glucose: 58 mg/dL — ABNORMAL LOW (ref 65–99)
Potassium: 4 mmol/L (ref 3.5–5.2)
Sodium: 144 mmol/L (ref 134–144)
Total Protein: 7.5 g/dL (ref 6.0–8.5)

## 2020-03-04 ENCOUNTER — Ambulatory Visit: Payer: 59 | Admitting: Emergency Medicine

## 2020-03-04 ENCOUNTER — Other Ambulatory Visit: Payer: Self-pay

## 2020-03-04 LAB — TB SKIN TEST
Induration: 0 mm
TB Skin Test: NEGATIVE

## 2020-06-01 ENCOUNTER — Ambulatory Visit: Payer: 59 | Admitting: Emergency Medicine

## 2020-10-31 ENCOUNTER — Other Ambulatory Visit: Payer: Self-pay

## 2020-10-31 ENCOUNTER — Emergency Department (HOSPITAL_COMMUNITY)
Admission: EM | Admit: 2020-10-31 | Discharge: 2020-11-01 | Disposition: A | Discharge status: Home / Self Care | Payer: Self-pay | Attending: Emergency Medicine | Admitting: Emergency Medicine

## 2020-10-31 ENCOUNTER — Encounter (HOSPITAL_COMMUNITY): Payer: Self-pay | Admitting: Emergency Medicine

## 2020-10-31 DIAGNOSIS — J189 Pneumonia, unspecified organism: Secondary | ICD-10-CM | POA: Insufficient documentation

## 2020-10-31 DIAGNOSIS — E119 Type 2 diabetes mellitus without complications: Secondary | ICD-10-CM | POA: Insufficient documentation

## 2020-10-31 DIAGNOSIS — Z79899 Other long term (current) drug therapy: Secondary | ICD-10-CM | POA: Insufficient documentation

## 2020-10-31 DIAGNOSIS — Z20822 Contact with and (suspected) exposure to covid-19: Secondary | ICD-10-CM | POA: Insufficient documentation

## 2020-10-31 DIAGNOSIS — I10 Essential (primary) hypertension: Secondary | ICD-10-CM | POA: Insufficient documentation

## 2020-10-31 DIAGNOSIS — Z7984 Long term (current) use of oral hypoglycemic drugs: Secondary | ICD-10-CM | POA: Insufficient documentation

## 2020-10-31 DIAGNOSIS — Z8616 Personal history of COVID-19: Secondary | ICD-10-CM | POA: Insufficient documentation

## 2020-10-31 MED ORDER — ACETAMINOPHEN 325 MG PO TABS
650.0000 mg | ORAL_TABLET | Freq: Once | ORAL | Status: AC | PRN
  Administered 2020-10-31: 650 mg via ORAL
  Filled 2020-10-31: qty 2

## 2020-10-31 NOTE — ED Triage Notes (Signed)
Pt reports flu symptoms including cough, weakness, loss of appetite, fever x4 days

## 2020-11-01 ENCOUNTER — Emergency Department (HOSPITAL_COMMUNITY): Payer: Self-pay

## 2020-11-01 LAB — RESP PANEL BY RT-PCR (FLU A&B, COVID) ARPGX2
Influenza A by PCR: NEGATIVE
Influenza B by PCR: NEGATIVE
SARS Coronavirus 2 by RT PCR: NEGATIVE

## 2020-11-01 MED ORDER — AMOXICILLIN-POT CLAVULANATE 875-125 MG PO TABS
1.0000 | ORAL_TABLET | Freq: Two times a day (BID) | ORAL | 0 refills | Status: DC
Start: 2020-11-01 — End: 2021-08-23

## 2020-11-01 MED ORDER — DOXYCYCLINE HYCLATE 100 MG PO CAPS
100.0000 mg | ORAL_CAPSULE | Freq: Two times a day (BID) | ORAL | 0 refills | Status: DC
Start: 2020-11-01 — End: 2021-08-23

## 2020-11-01 NOTE — ED Notes (Signed)
Patient given discharge paperwork and prescriptions. Verbalized understanding of teaching. No IV access. Ambulatory to exit in NAD with steady gait. 

## 2020-11-01 NOTE — Discharge Instructions (Addendum)
You were seen in the ER today for cough.  Your covid/flu tests were negative.  Your chest xray showed findings of pneumonia.  We are sending you home with augmentin & doxycycline- these are antibiotics to treat for pneumonia.   Please take all of your antibiotics until finished. You may develop abdominal discomfort or diarrhea from the antibiotic.  You may help offset this with probiotics which you can buy at the store (ask your pharmacist if unable to find) or get probiotics in the form of eating yogurt. Do not eat or take the probiotics until 2 hours after your antibiotic. If you are unable to tolerate these side effects follow-up with your primary care provider or return to the emergency department.   If you begin to experience any blistering, rashes, swelling, or difficulty breathing seek medical care for evaluation of potentially more serious side effects.   Please be aware that this medication may interact with other medications you are taking, please be sure to discuss your medication list with your pharmacist.   Please follow up with primary care within 3 days. Return to the ER for new or worsening symptoms including but not limited to trouble breathing, chest pain, passing out, fever after 48 hours of antibiotics, coughing up blood, or any other concerns.

## 2020-11-01 NOTE — ED Provider Notes (Signed)
[facility] EMERGENCY DEPARTMENT Provider Note   CSN: [id number] Arrival date & time: 10/31/20  2257     History Chief Complaint  Patient presents with  . Cough  . flu symptoms    Mark Schneider is a 40 y.o. male with a history of diabetes mellitus who presents to the emergency department with complaints of flulike symptoms over the past 4 days. Patient states that he has had subjective fevers, fatigue, decreased appetite, and cough productive of phlegm sputum. Tried OTC mucinex without much relief. No other alleviating/aggravating factors. Denies chest pain, dyspnea, abdominal pain, or N/V/D. Has had his covid & flu shot.   HPI     Past Medical History:  Diagnosis Date  . Diabetes mellitus without complication [facility])     Patient Active Problem List   Diagnosis Date Noted  . Hypertension associated with diabetes (HCC) 11/27/2019  . History of COVID-19 11/27/2019  . New onset type 2 diabetes mellitus (HCC) 11/15/2017  . Acute renal failure (ARF) (HCC) 11/15/2017  . Body mass index (BMI) of 50-59.9 in adult [facility]) 11/15/2017    Past Surgical History:  Procedure Laterality Date  . DENTAL SURGERY    . TOE SURGERY         Family History  Problem Relation Age of Onset  . Diabetes Mother   . Diabetes Sister   . Diabetes Maternal Grandmother   . Diabetes Maternal Uncle   . Diabetes Maternal Grandfather     Social History   Tobacco Use  . Smoking status: Never Smoker  . Smokeless tobacco: Never Used  Vaping Use  . Vaping Use: Never used  Substance Use Topics  . Alcohol use: No  . Drug use: No    Home Medications Prior to Admission medications   Medication Sig Start Date End Date Taking? Authorizing Provider  dicyclomine (BENTYL) 20 MG tablet Take 1 tablet (20 mg total) by mouth 2 (two) times daily. 12/08/18   McDonald, Mia A, PA-C  glipiZIDE (GLUCOTROL) 5 MG tablet Take 1 tablet (5 mg total) by mouth 2 (two) times daily before a meal. 03/01/20  05/30/20  Georgina Quint, MD  metFORMIN (GLUCOPHAGE) 1000 MG tablet Take 1 tablet (1,000 mg total) by mouth 2 (two) times daily with a meal. 11/27/19   Sagardia, Eilleen Kempf, MD  olmesartan (BENICAR) 20 MG tablet Take 1 tablet (20 mg total) by mouth daily. 11/27/19 02/25/20  Georgina Quint, MD  ondansetron (ZOFRAN ODT) 4 MG disintegrating tablet Take 1 tablet (4 mg total) by mouth every 8 (eight) hours as needed. Patient not taking: Reported on 03/01/2020 12/08/18   McDonald, Mia A, PA-C  rosuvastatin (CRESTOR) 10 MG tablet Take 1 tablet (10 mg total) by mouth daily. 03/01/20   Georgina Quint, MD    Allergies    Sheep-derived products and Shellfish allergy  Review of Systems   Review of Systems  Constitutional: Positive for appetite change, fatigue and fever.  HENT: Negative for ear pain and sore throat.   Respiratory: Positive for cough. Negative for shortness of breath.   Cardiovascular: Negative for chest pain.  Gastrointestinal: Negative for abdominal pain, diarrhea, nausea and vomiting.  Neurological: Negative for syncope.  All other systems reviewed and are negative.   Physical Exam Updated Vital Signs BP (!) 143/94   Pulse 95   Temp (!) 100.5 F (38.1 C) (Oral)   Resp 20   SpO2 97%   Physical Exam Vitals and nursing note reviewed.  Constitutional:  General: He is not in acute distress.    Appearance: He is well-developed. He is not toxic-appearing.  HENT:     Head: Normocephalic and atraumatic.     Right Ear: Ear canal normal. Tympanic membrane is not perforated, erythematous, retracted or bulging.     Left Ear: Ear canal normal. Tympanic membrane is not perforated, erythematous, retracted or bulging.     Ears:     Comments: No mastoid erythema/swellng/tenderness.     Nose:     Right Sinus: No maxillary sinus tenderness or frontal sinus tenderness.     Left Sinus: No maxillary sinus tenderness or frontal sinus tenderness.     Mouth/Throat:      Pharynx: Oropharynx is clear. Uvula midline. No oropharyngeal exudate or posterior oropharyngeal erythema.     Comments: Posterior oropharynx is symmetric appearing. Patient tolerating own secretions without difficulty. No trismus. No drooling. No hot potato voice. No swelling beneath the tongue, submandibular compartment is soft.  Eyes:     General:        Right eye: No discharge.        Left eye: No discharge.     Conjunctiva/sclera: Conjunctivae normal.  Cardiovascular:     Rate and Rhythm: Normal rate and regular rhythm.  Pulmonary:     Effort: Pulmonary effort is normal. No respiratory distress.     Breath sounds: Normal breath sounds. No wheezing, rhonchi or rales.  Abdominal:     General: There is no distension.     Palpations: Abdomen is soft.     Tenderness: There is no abdominal tenderness. There is no guarding or rebound.  Musculoskeletal:     Cervical back: Neck supple. No rigidity.  Lymphadenopathy:     Cervical: No cervical adenopathy.  Skin:    General: Skin is warm and dry.     Findings: No rash.  Neurological:     Mental Status: He is alert.  Psychiatric:        Behavior: Behavior normal.     ED Results / Procedures / Treatments   Labs (all labs ordered are listed, but only abnormal results are displayed) Labs Reviewed  RESP PANEL BY RT-PCR (FLU A&B, COVID) [id number]    EKG None  Radiology DG Chest Portable 1 View  Result Date: 11/01/2020 CLINICAL DATA:  Cough, fever EXAM: PORTABLE CHEST 1 VIEW COMPARISON:  Radiograph 12/06/2018 FINDINGS: Mixed patchy and interstitial opacities are present throughout both lungs with associated airways thickening. No pneumothorax or visible effusion. Slight prominence of the cardiac silhouette though may be accentuated by portable technique and low lung volumes. Remaining cardiomediastinal contours are unremarkable. No acute osseous or soft tissue abnormality. IMPRESSION: Mixed patchy and interstitial opacities present  throughout both lungs, could reflect a multifocal pneumonia in the setting of cough and fever including potential atypical viral infectious etiologies. Electronically Signed   By: Kreg Shropshire M.D.   On: 11/01/2020 00:50    Procedures Procedures   Medications Ordered in ED Medications  acetaminophen (TYLENOL) tablet 650 mg (650 mg Oral Given 10/31/20 2345)    ED Course  I have reviewed the triage vital signs and the nursing notes.  Pertinent labs & imaging results that were available during my care of the patient were reviewed by me and considered in my medical decision making (see chart for details).  Ky A Gindlesperger was evaluated in Emergency Department on 11/01/2020 for the symptoms described in the history of present illness. He/she was evaluated in the context of the global  COVID-19 pandemic, which necessitated consideration that the patient might be at risk for infection with the SARS-CoV-2 virus that causes COVID-19. Institutional protocols and algorithms that pertain to the evaluation of patients at risk for COVID-19 are in a state of rapid change based on information released by regulatory bodies including the CDC and federal and state organizations. These policies and algorithms were followed during the patient's care in the ED.    MDM Rules/Calculators/A&P                         Patient presents to the ED with complaints of flu like sxs x 4 days. Nontoxic, mildly febrile w/ likely resultant tachycardia on arrival, HR in the 90s on my exam.   Additional history obtained:  Additional history obtained from chart review & nursing note review.   Lab Tests:  I Ordered, reviewed, and interpreted labs, which included:  Covid/influenza test: Negative.   Imaging Studies ordered:  I ordered imaging studies which included CXR, I independently reviewed, formal radiology impression shows: Mixed patchy and interstitial opacities present throughout both lungs, could reflect a multifocal  pneumonia in the setting of cough and fever including potential atypical viral infectious etiologies.   ED Course:  CXR with findings concerning for pneumonia, flu/covid negative, will proceed with tx for CAP. Patient ambulated in the ED with SPO2 maintaining > 95% on RA without signs of respiratory distress. Appears appropriate for trial of outpatient abx with close PCP followup. I discussed results, treatment plan, need for follow-up, and return precautions with the patient. Provided opportunity for questions, patient confirmed understanding and is in agreement with plan.   Portions of this note were generated with Scientist, clinical (histocompatibility and immunogenetics). Dictation errors may occur despite best attempts at proofreading.  Final Clinical Impression(s) / ED Diagnoses Final diagnoses:  Community acquired pneumonia, unspecified laterality    Rx / DC Orders ED Discharge Orders         Ordered    doxycycline (VIBRAMYCIN) 100 MG capsule  2 times daily        11/01/20 0254    amoxicillin-clavulanate (AUGMENTIN) 875-125 MG tablet  Every 12 hours        11/01/20 0254           Jinna Weinman, Pleas Koch, PA-C 11/01/20 0329    Geoffery Lyons, MD 11/01/20 0500

## 2021-08-23 ENCOUNTER — Encounter (HOSPITAL_BASED_OUTPATIENT_CLINIC_OR_DEPARTMENT_OTHER): Payer: Self-pay | Admitting: Emergency Medicine

## 2021-08-23 ENCOUNTER — Other Ambulatory Visit: Payer: Self-pay

## 2021-08-23 ENCOUNTER — Emergency Department (HOSPITAL_BASED_OUTPATIENT_CLINIC_OR_DEPARTMENT_OTHER)
Admission: EM | Admit: 2021-08-23 | Discharge: 2021-08-23 | Disposition: A | Discharge status: Home / Self Care | Payer: 59 | Attending: Emergency Medicine | Admitting: Emergency Medicine

## 2021-08-23 ENCOUNTER — Other Ambulatory Visit (HOSPITAL_BASED_OUTPATIENT_CLINIC_OR_DEPARTMENT_OTHER): Payer: Self-pay

## 2021-08-23 ENCOUNTER — Emergency Department (HOSPITAL_BASED_OUTPATIENT_CLINIC_OR_DEPARTMENT_OTHER): Payer: 59 | Admitting: Radiology

## 2021-08-23 DIAGNOSIS — L03115 Cellulitis of right lower limb: Secondary | ICD-10-CM | POA: Diagnosis not present

## 2021-08-23 DIAGNOSIS — Z7984 Long term (current) use of oral hypoglycemic drugs: Secondary | ICD-10-CM | POA: Insufficient documentation

## 2021-08-23 DIAGNOSIS — M7989 Other specified soft tissue disorders: Secondary | ICD-10-CM | POA: Diagnosis not present

## 2021-08-23 DIAGNOSIS — L03032 Cellulitis of left toe: Secondary | ICD-10-CM

## 2021-08-23 DIAGNOSIS — M79675 Pain in left toe(s): Secondary | ICD-10-CM | POA: Diagnosis not present

## 2021-08-23 DIAGNOSIS — E119 Type 2 diabetes mellitus without complications: Secondary | ICD-10-CM | POA: Insufficient documentation

## 2021-08-23 DIAGNOSIS — R7989 Other specified abnormal findings of blood chemistry: Secondary | ICD-10-CM | POA: Insufficient documentation

## 2021-08-23 DIAGNOSIS — E1159 Type 2 diabetes mellitus with other circulatory complications: Secondary | ICD-10-CM

## 2021-08-23 DIAGNOSIS — M79674 Pain in right toe(s): Secondary | ICD-10-CM | POA: Diagnosis not present

## 2021-08-23 LAB — CBC WITH DIFFERENTIAL/PLATELET
Abs Immature Granulocytes: 0.01 10*3/uL (ref 0.00–0.07)
Basophils Absolute: 0 10*3/uL (ref 0.0–0.1)
Basophils Relative: 0 %
Eosinophils Absolute: 0.2 10*3/uL (ref 0.0–0.5)
Eosinophils Relative: 4 %
HCT: 48.8 % (ref 39.0–52.0)
Hemoglobin: 16 g/dL (ref 13.0–17.0)
Immature Granulocytes: 0 %
Lymphocytes Relative: 28 %
Lymphs Abs: 1.3 10*3/uL (ref 0.7–4.0)
MCH: 29.1 pg (ref 26.0–34.0)
MCHC: 32.8 g/dL (ref 30.0–36.0)
MCV: 88.9 fL (ref 80.0–100.0)
Monocytes Absolute: 0.5 10*3/uL (ref 0.1–1.0)
Monocytes Relative: 10 %
Neutro Abs: 2.8 10*3/uL (ref 1.7–7.7)
Neutrophils Relative %: 58 %
Platelets: 188 10*3/uL (ref 150–400)
RBC: 5.49 MIL/uL (ref 4.22–5.81)
RDW: 13.2 % (ref 11.5–15.5)
WBC: 4.8 10*3/uL (ref 4.0–10.5)
nRBC: 0 % (ref 0.0–0.2)

## 2021-08-23 LAB — BASIC METABOLIC PANEL
Anion gap: 9 (ref 5–15)
BUN: 14 mg/dL (ref 6–20)
CO2: 26 mmol/L (ref 22–32)
Calcium: 9.3 mg/dL (ref 8.9–10.3)
Chloride: 106 mmol/L (ref 98–111)
Creatinine, Ser: 1.36 mg/dL — ABNORMAL HIGH (ref 0.61–1.24)
GFR, Estimated: >60 mL/min (ref >60)
Glucose, Bld: 112 mg/dL — ABNORMAL HIGH (ref 70–99)
Potassium: 4.4 mmol/L (ref 3.5–5.1)
Sodium: 141 mmol/L (ref 135–145)

## 2021-08-23 LAB — C-REACTIVE PROTEIN: CRP: 1.3 mg/dL — ABNORMAL HIGH (ref <1.0)

## 2021-08-23 LAB — SEDIMENTATION RATE: Sed Rate: 1 mm/hr (ref 0–16)

## 2021-08-23 LAB — CBG MONITORING, ED: Glucose-Capillary: 112 mg/dL — ABNORMAL HIGH (ref 70–99)

## 2021-08-23 MED ORDER — METFORMIN HCL 1000 MG PO TABS
1000.0000 mg | ORAL_TABLET | Freq: Two times a day (BID) | ORAL | 3 refills | Status: DC
  Filled 2021-08-23: qty 60, 30d supply, fill #0

## 2021-08-23 MED ORDER — CEPHALEXIN 500 MG PO CAPS
500.0000 mg | ORAL_CAPSULE | Freq: Four times a day (QID) | ORAL | 0 refills | Status: AC
  Filled 2021-08-23: qty 28, 7d supply, fill #0

## 2021-08-23 MED ORDER — IBUPROFEN 800 MG PO TABS
800.0000 mg | ORAL_TABLET | Freq: Once | ORAL | Status: DC
Start: 2021-08-23 — End: 2021-08-23

## 2021-08-23 MED ORDER — DOXYCYCLINE HYCLATE 100 MG PO CAPS
100.0000 mg | ORAL_CAPSULE | Freq: Two times a day (BID) | ORAL | 0 refills | Status: AC
  Filled 2021-08-23: qty 14, 7d supply, fill #0

## 2021-08-23 MED ORDER — LIDOCAINE HCL (PF) 1 % IJ SOLN
5.0000 mL | Freq: Once | INTRAMUSCULAR | Status: DC
Start: 2021-08-23 — End: 2021-08-23
  Filled 2021-08-23: qty 5

## 2021-08-23 MED ORDER — GLIPIZIDE 5 MG PO TABS
5.0000 mg | ORAL_TABLET | Freq: Two times a day (BID) | ORAL | 0 refills | Status: DC
  Filled 2021-08-23: qty 60, 30d supply, fill #0

## 2021-08-23 NOTE — ED Triage Notes (Signed)
Pt arrives to ED with c/o toe swelling. This started in his left great toe yesterday. He denies injury to foot/toe. He has hx of cellulitis in foot.

## 2021-08-23 NOTE — Discharge Instructions (Addendum)
You were evaluated in the Emergency Department and after careful evaluation, we did not find any emergent condition requiring admission or further testing in the hospital.  Your exam/testing today was overall reassuring. Your toe joint had no evidence of fluid to suggest gout or joint infection.  We will treat for cellulitis of the toe of your foot. Your kidney function appears to be at baseline. Recommend you establish care with a PCP in the area to manage your blood pressure, renal disease, and diabetes. Return for worsening redness, swelling, pain of the toe of the foot. Your diabetes medications have been refilled.  Please return to the Emergency Department if you experience any worsening of your condition.  Thank you for allowing [place] to be a part of your care.

## 2021-08-23 NOTE — ED Notes (Signed)
Patient verbalizes understanding of discharge instructions. Opportunity for questioning and answers were provided. Patient discharged from ED.  °

## 2021-08-23 NOTE — ED Provider Notes (Addendum)
[facility] EMERGENCY DEPT Provider Note   CSN: [id number] Arrival date & time: 08/23/21  [id number]     History  Chief Complaint  Patient presents with   toe swelling    Mark Schneider is a 41 y.o. male.  HPI  41 year old male with a history of toe cellulitis who presents to the emergency department with pain and swelling in his right great toe that started yesterday.  He denies any injury to the foot.  He does have a history of tonsillitis that was successfully treated with antibiotics p.o.  He denies any history of gout.  He denies any fevers or chills.  He is ambulatory but with some difficulty due to pain in his toe.  Home Medications Prior to Admission medications   Medication Sig Start Date End Date Taking? Authorizing Provider  cephALEXin (KEFLEX) 500 MG capsule Take 1 capsule (500 mg total) by mouth 4 (four) times daily for 7 days. 08/23/21 08/30/21 Yes Regan Lemming, MD  doxycycline (VIBRAMYCIN) 100 MG capsule Take 1 capsule (100 mg total) by mouth 2 (two) times daily for 7 days. 08/23/21 08/30/21 Yes Regan Lemming, MD  dicyclomine (BENTYL) 20 MG tablet Take 1 tablet (20 mg total) by mouth 2 (two) times daily. 12/08/18   McDonald, Mia A, PA-C  glipiZIDE (GLUCOTROL) 5 MG tablet Take 1 tablet (5 mg total) by mouth 2 (two) times daily before a meal. 08/23/21 11/21/21  Regan Lemming, MD  metFORMIN (GLUCOPHAGE) 1000 MG tablet Take 1 tablet (1,000 mg total) by mouth 2 (two) times daily with a meal. 08/23/21   Regan Lemming, MD  olmesartan (BENICAR) 20 MG tablet Take 1 tablet (20 mg total) by mouth daily. 11/27/19 02/25/20  Horald Pollen, MD  ondansetron (ZOFRAN ODT) 4 MG disintegrating tablet Take 1 tablet (4 mg total) by mouth every 8 (eight) hours as needed. Patient not taking: Reported on 03/01/2020 12/08/18   McDonald, Mia A, PA-C  rosuvastatin (CRESTOR) 10 MG tablet Take 1 tablet (10 mg total) by mouth daily. 03/01/20   Horald Pollen, MD      Allergies     Sheep-derived products and Shellfish allergy    Review of Systems   Review of Systems  Musculoskeletal:  Positive for arthralgias and joint swelling.  All other systems reviewed and are negative.  Physical Exam Updated Vital Signs BP (!) 191/142    Pulse 79    Temp 98.6 F (37 C)    Resp 18    Ht 5\' 10"  (1.778 m)    Wt (!) 183.7 kg    SpO2 99%    BMI 58.11 kg/m  Physical Exam Vitals and nursing note reviewed.  Constitutional:      General: He is not in acute distress. HENT:     Head: Normocephalic and atraumatic.  Eyes:     Conjunctiva/sclera: Conjunctivae normal.     Pupils: Pupils are equal, round, and reactive to light.  Cardiovascular:     Rate and Rhythm: Normal rate and regular rhythm.  Pulmonary:     Effort: Pulmonary effort is normal. No respiratory distress.  Abdominal:     General: There is no distension.     Tenderness: There is no guarding.  Musculoskeletal:        General: No deformity or signs of injury.     Cervical back: Neck supple.     Comments: Left great toe with fusiform swelling, tenderness to palpation bilaterally, pain with range of motion with dorsiflexion and plantarflexion.  Skin:    Findings: No lesion or rash.  Neurological:     General: No focal deficit present.     Mental Status: He is alert. Mental status is at baseline.    ED Results / Procedures / Treatments   Labs (all labs ordered are listed, but only abnormal results are displayed) Labs Reviewed  BASIC METABOLIC PANEL - Abnormal; Notable for the following components:      Result Value   Glucose, Bld 112 (*)    Creatinine, Ser 1.36 (*)    All other components within normal limits  C-REACTIVE PROTEIN - Abnormal; Notable for the following components:   CRP 1.3 (*)    All other components within normal limits  CBG MONITORING, ED - Abnormal; Notable for the following components:   Glucose-Capillary 112 (*)    All other components within normal limits  CBC WITH  DIFFERENTIAL/PLATELET  SEDIMENTATION RATE    EKG None  Radiology DG Toe Great Left  Result Date: 08/23/2021 CLINICAL DATA:  Pain and swelling of the left great toe. EXAM: LEFT GREAT TOE COMPARISON:  Radiographs 11/14/2016 FINDINGS: The joint spaces are maintained. No acute bony abnormality or changes of an erosive arthropathy. IMPRESSION: No acute bony findings. Electronically Signed   By: Marijo Sanes M.D.   On: 08/23/2021 10:31    Procedures .Joint Aspiration/Arthrocentesis  Date/Time: 08/23/2021 11:22 AM Performed by: Regan Lemming, MD Authorized by: Regan Lemming, MD   Consent:    Consent obtained:  Verbal   Consent given by:  Patient   Risks discussed:  Bleeding, infection and pain   Alternatives discussed:  No treatment Universal protocol:    Patient identity confirmed:  Verbally with patient Location:    Location:  Toe   Toe:  Great toe   Great toe joint:  R great MTP Anesthesia:    Anesthesia method:  None Procedure details:    Preparation: Patient was prepped and draped in usual sterile fashion     Needle gauge:  72 G   Ultrasound guidance: no     Approach:  Medial   Aspirate amount:  0   Aspirate characteristics:  Bloody   Steroid injected: no     Specimen collected: no   Post-procedure details:    Dressing:  Adhesive bandage   Procedure completion:  Tolerated Comments:     Dry tap, no synovial fluid aspirated    Medications Ordered in ED Medications - No data to display  ED Course/ Medical Decision Making/ A&P Clinical Course as of 08/24/21 [place] Aug 23, 2021  1139 Creatinine(!): 1.36 [JL]    Clinical Course User Index [JL] Regan Lemming, MD                           Medical Decision Making Amount and/or Complexity of Data Reviewed Labs: ordered. Decision-making details documented in ED Course. Radiology: ordered.  Risk Prescription drug management.    41 year old male with a history of toe cellulitis who presents to the  emergency department with pain and swelling in his right great toe that started yesterday.  He denies any injury to the foot.  He does have a history of tonsillitis that was successfully treated with antibiotics p.o.  He denies any history of gout.  He denies any fevers or chills.  He is ambulatory but with some difficulty due to pain in his toe.  On exam, the patient did have fusiform swelling of the toe with  tenderness palpation bilaterally.  He did have pain with range of motion of the MTP joint.  Concern for possible gout flare versus recurrent toe cellulitis.  No evidence of overlying cellulitis over the patient's MTP joint therefore an arthrocentesis was performed.  Arthrocentesis revealed a dry joint with no significant swelling or fluid aspirated.  Symptoms are most likely consistent with cellulitis of the toe.  Patient was prescribed antibiotics and advised to follow-up outpatient for further management.  Return precautions provided.  He did request refill of his home diabetes medications.  Stable for discharge.   Final Clinical Impression(s) / ED Diagnoses Final diagnoses:  Cellulitis of toe of left foot    Rx / DC Orders ED Discharge Orders          Ordered    cephALEXin (KEFLEX) 500 MG capsule  4 times daily        08/23/21 1121    doxycycline (VIBRAMYCIN) 100 MG capsule  2 times daily        08/23/21 1121    glipiZIDE (GLUCOTROL) 5 MG tablet  2 times daily before meals        08/23/21 1127    metFORMIN (GLUCOPHAGE) 1000 MG tablet  2 times daily with meals        08/23/21 1127              Regan Lemming, MD 08/24/21 1358    Regan Lemming, MD 08/24/21 1359

## 2021-12-06 ENCOUNTER — Encounter (HOSPITAL_BASED_OUTPATIENT_CLINIC_OR_DEPARTMENT_OTHER): Payer: Self-pay | Admitting: Emergency Medicine

## 2021-12-06 ENCOUNTER — Other Ambulatory Visit (HOSPITAL_BASED_OUTPATIENT_CLINIC_OR_DEPARTMENT_OTHER): Payer: Self-pay

## 2021-12-06 ENCOUNTER — Other Ambulatory Visit: Payer: Self-pay

## 2021-12-06 ENCOUNTER — Emergency Department (HOSPITAL_BASED_OUTPATIENT_CLINIC_OR_DEPARTMENT_OTHER)
Admission: EM | Admit: 2021-12-06 | Discharge: 2021-12-06 | Disposition: A | Discharge status: Home / Self Care | Payer: 59 | Attending: Emergency Medicine | Admitting: Emergency Medicine

## 2021-12-06 DIAGNOSIS — Z7984 Long term (current) use of oral hypoglycemic drugs: Secondary | ICD-10-CM | POA: Diagnosis not present

## 2021-12-06 DIAGNOSIS — M7662 Achilles tendinitis, left leg: Secondary | ICD-10-CM | POA: Diagnosis not present

## 2021-12-06 DIAGNOSIS — E1159 Type 2 diabetes mellitus with other circulatory complications: Secondary | ICD-10-CM

## 2021-12-06 DIAGNOSIS — E119 Type 2 diabetes mellitus without complications: Secondary | ICD-10-CM | POA: Insufficient documentation

## 2021-12-06 DIAGNOSIS — Z760 Encounter for issue of repeat prescription: Secondary | ICD-10-CM | POA: Diagnosis not present

## 2021-12-06 DIAGNOSIS — M7660 Achilles tendinitis, unspecified leg: Secondary | ICD-10-CM

## 2021-12-06 DIAGNOSIS — M79673 Pain in unspecified foot: Secondary | ICD-10-CM | POA: Diagnosis not present

## 2021-12-06 HISTORY — DX: Essential (primary) hypertension: I10

## 2021-12-06 MED ORDER — METFORMIN HCL 1000 MG PO TABS
1000.0000 mg | ORAL_TABLET | Freq: Two times a day (BID) | ORAL | 3 refills | Status: DC
  Filled 2021-12-06: qty 60, 30d supply, fill #0

## 2021-12-06 MED ORDER — GLIPIZIDE 5 MG PO TABS
5.0000 mg | ORAL_TABLET | Freq: Two times a day (BID) | ORAL | 0 refills | Status: DC
  Filled 2021-12-06: qty 60, 30d supply, fill #0

## 2021-12-06 NOTE — Discharge Instructions (Signed)
Your foot pain today is likely due to something called Achilles tendinopathy.  Typically to relieve this, you will need about 4 to 6 weeks using a heel sleeve that you can get over-the-counter at any drugstore.  Additionally, you should look up different stretches for your gastrocnemius/soleus (your calf muscles).  I have sent you in the refills for your diabetic medications.  I strongly suggest that you set up care with a PCP to do this in the future.

## 2021-12-06 NOTE — ED Triage Notes (Signed)
Pt arrives to ED with c/o left sided foot pain that started yesterday. Pt denies injury to the foot. The pain is located posteriorly located on the heel and achilles tendon. Pt also request refills for his diabetic medications.

## 2021-12-06 NOTE — ED Provider Notes (Signed)
[facility] EMERGENCY DEPT Provider Note   CSN: [id number] Arrival date & time: 12/06/21  1007     History  Chief Complaint  Patient presents with   Foot Pain    Mark Schneider is a 41 y.o. male with history of type 2 diabetes who presents to the ED for evaluation of foot pain that started today.  Denies trauma or injury.  States that he did have an Achilles injury to that foot about 10 years ago while playing football.  Patient is worse on dorsiflexion and when walking.  No treatment prior to arrival.  He denies numbness and tingling.  He has no other systemic complaints.  He is requesting refills of his glipizide and metformin.  He states that he had this filled here at the emergency department last time and he is still attempting to set up care with a PCP.   Foot Pain      Home Medications Prior to Admission medications   Medication Sig Start Date End Date Taking? Authorizing Provider  dicyclomine (BENTYL) 20 MG tablet Take 1 tablet (20 mg total) by mouth 2 (two) times daily. 12/08/18   McDonald, Mia A, PA-C  glipiZIDE (GLUCOTROL) 5 MG tablet Take 1 tablet (5 mg total) by mouth 2 (two) times daily before a meal. 12/06/21 03/06/22  Janell Quiet, PA-C  metFORMIN (GLUCOPHAGE) 1000 MG tablet Take 1 tablet (1,000 mg total) by mouth 2 (two) times daily with a meal. 12/06/21   Ouida Sills, Cristal Deer, PA-C  olmesartan (BENICAR) 20 MG tablet Take 1 tablet (20 mg total) by mouth daily. 11/27/19 02/25/20  Georgina Quint, MD  ondansetron (ZOFRAN ODT) 4 MG disintegrating tablet Take 1 tablet (4 mg total) by mouth every 8 (eight) hours as needed. Patient not taking: Reported on 03/01/2020 12/08/18   McDonald, Mia A, PA-C  rosuvastatin (CRESTOR) 10 MG tablet Take 1 tablet (10 mg total) by mouth daily. 03/01/20   Georgina Quint, MD      Allergies    Sheep-derived products and Shellfish allergy    Review of Systems   Review of Systems  Physical Exam Updated Vital Signs BP  (!) 179/128 (BP Location: Left Arm)   Pulse 89   Temp 98.1 F (36.7 C) (Temporal)   Resp 16   Ht 5\' 10"  (1.778 m)   Wt (!) 174.6 kg   SpO2 96%   BMI 55.24 kg/m  Physical Exam Vitals and nursing note reviewed.  Constitutional:      General: He is not in acute distress.    Appearance: He is not ill-appearing.  HENT:     Head: Atraumatic.  Eyes:     Conjunctiva/sclera: Conjunctivae normal.  Cardiovascular:     Rate and Rhythm: Normal rate and regular rhythm.     Pulses: Normal pulses.     Heart sounds: No murmur heard. Pulmonary:     Effort: Pulmonary effort is normal. No respiratory distress.     Breath sounds: Normal breath sounds.  Abdominal:     General: Abdomen is flat. There is no distension.     Palpations: Abdomen is soft.     Tenderness: There is no abdominal tenderness.  Musculoskeletal:        General: Normal range of motion.     Cervical back: Normal range of motion.     Comments: Tenderness to the insertion of the Achilles tendon.  No erythema, swelling, bruising or deformity.  Pain aggravated upon dorsiflexion.  Subjective sensation intact.  Skin:    General: Skin is warm and dry.     Capillary Refill: Capillary refill takes less than 2 seconds.  Neurological:     General: No focal deficit present.     Mental Status: He is alert.  Psychiatric:        Mood and Affect: Mood normal.    ED Results / Procedures / Treatments   Labs (all labs ordered are listed, but only abnormal results are displayed) Labs Reviewed - No data to display  EKG None  Radiology No results found.  Procedures Procedures    Medications Ordered in ED Medications - No data to display  ED Course/ Medical Decision Making/ A&P                           Medical Decision Making Risk Prescription drug management.   41 year old male in no acute distress, nontoxic-appearing presents to the ED for evaluation of nontraumatic posterior foot pain that began yesterday.  Vitals  without significant abnormality.On exam, he has tenderness to the insertion of the left Achilles tendon without any deformity, erythema or swelling.  I considered obtaining imaging of the ankle, however I did not think that this would affect how I treated the patient.  Advised patient to purchase heel sleeve and wear that for 4 to 6 weeks and to do gastrocnemius/soleus stretches several times a day.  Offered cam boot, however patient declined.  I also refill patient's glipizide and metformin although I did recommend that he set up care with a PCP to do this in the future. patient expresses understanding and is amenable to plan.  Discharged home in good condition. Final Clinical Impression(s) / ED Diagnoses Final diagnoses:  Insertional Achilles tendinopathy  Medication refill    Rx / DC Orders ED Discharge Orders          Ordered    glipiZIDE (GLUCOTROL) 5 MG tablet  2 times daily before meals        12/06/21 1209    metFORMIN (GLUCOPHAGE) 1000 MG tablet  2 times daily with meals        12/06/21 1223              Janell Quiet, [place] 12/06/21 1230    Jacalyn Lefevre, MD 12/06/21 1510

## 2022-04-01 DIAGNOSIS — E119 Type 2 diabetes mellitus without complications: Secondary | ICD-10-CM | POA: Diagnosis not present

## 2022-04-01 DIAGNOSIS — Z596 Low income: Secondary | ICD-10-CM | POA: Diagnosis not present

## 2022-04-01 DIAGNOSIS — Z6841 Body Mass Index (BMI) 40.0 and over, adult: Secondary | ICD-10-CM | POA: Diagnosis not present

## 2022-04-01 DIAGNOSIS — Z7984 Long term (current) use of oral hypoglycemic drugs: Secondary | ICD-10-CM | POA: Diagnosis not present

## 2022-04-01 DIAGNOSIS — Z8249 Family history of ischemic heart disease and other diseases of the circulatory system: Secondary | ICD-10-CM | POA: Diagnosis not present

## 2022-04-01 DIAGNOSIS — Z87892 Personal history of anaphylaxis: Secondary | ICD-10-CM | POA: Diagnosis not present

## 2022-04-01 DIAGNOSIS — I10 Essential (primary) hypertension: Secondary | ICD-10-CM | POA: Diagnosis not present

## 2022-04-01 DIAGNOSIS — Z809 Family history of malignant neoplasm, unspecified: Secondary | ICD-10-CM | POA: Diagnosis not present

## 2022-04-01 DIAGNOSIS — Z833 Family history of diabetes mellitus: Secondary | ICD-10-CM | POA: Diagnosis not present

## 2022-04-01 DIAGNOSIS — R69 Illness, unspecified: Secondary | ICD-10-CM | POA: Diagnosis not present

## 2022-04-01 DIAGNOSIS — Z791 Long term (current) use of non-steroidal anti-inflammatories (NSAID): Secondary | ICD-10-CM | POA: Diagnosis not present

## 2022-04-12 ENCOUNTER — Encounter (HOSPITAL_BASED_OUTPATIENT_CLINIC_OR_DEPARTMENT_OTHER): Payer: Self-pay | Admitting: Family Medicine

## 2022-04-12 ENCOUNTER — Ambulatory Visit (INDEPENDENT_AMBULATORY_CARE_PROVIDER_SITE_OTHER): Payer: 59 | Admitting: Family Medicine

## 2022-04-12 ENCOUNTER — Other Ambulatory Visit (HOSPITAL_BASED_OUTPATIENT_CLINIC_OR_DEPARTMENT_OTHER): Payer: Self-pay

## 2022-04-12 VITALS — BP 190/125 | HR 90 | Ht 70.0 in | Wt 378.0 lb

## 2022-04-12 DIAGNOSIS — E1159 Type 2 diabetes mellitus with other circulatory complications: Secondary | ICD-10-CM | POA: Diagnosis not present

## 2022-04-12 DIAGNOSIS — E1165 Type 2 diabetes mellitus with hyperglycemia: Secondary | ICD-10-CM | POA: Diagnosis not present

## 2022-04-12 DIAGNOSIS — I152 Hypertension secondary to endocrine disorders: Secondary | ICD-10-CM

## 2022-04-12 DIAGNOSIS — E785 Hyperlipidemia, unspecified: Secondary | ICD-10-CM

## 2022-04-12 DIAGNOSIS — I10 Essential (primary) hypertension: Secondary | ICD-10-CM

## 2022-04-12 MED ORDER — ROSUVASTATIN CALCIUM 10 MG PO TABS
10.0000 mg | ORAL_TABLET | Freq: Every day | ORAL | 1 refills | Status: DC
  Filled 2022-04-12: qty 30, 30d supply, fill #0
  Filled 2022-06-28 – 2022-09-15 (×2): qty 30, 30d supply, fill #1
  Filled 2022-11-14: qty 30, 30d supply, fill #2

## 2022-04-12 MED ORDER — METFORMIN HCL 1000 MG PO TABS
1000.0000 mg | ORAL_TABLET | Freq: Two times a day (BID) | ORAL | 3 refills | Status: DC
  Filled 2022-04-12: qty 60, 30d supply, fill #0
  Filled 2022-06-28: qty 60, 30d supply, fill #1
  Filled 2022-09-15: qty 60, 30d supply, fill #2
  Filled 2022-11-14: qty 60, 30d supply, fill #3

## 2022-04-12 MED ORDER — LOSARTAN POTASSIUM 50 MG PO TABS
50.0000 mg | ORAL_TABLET | Freq: Every day | ORAL | 1 refills | Status: DC
  Filled 2022-04-12: qty 30, 30d supply, fill #0
  Filled 2022-06-28: qty 30, 30d supply, fill #1

## 2022-04-12 MED ORDER — GLIPIZIDE 5 MG PO TABS
5.0000 mg | ORAL_TABLET | Freq: Two times a day (BID) | ORAL | 0 refills | Status: DC
  Filled 2022-04-12: qty 60, 30d supply, fill #0
  Filled 2022-06-28: qty 60, 30d supply, fill #1
  Filled 2022-09-15: qty 60, 30d supply, fill #2

## 2022-04-12 MED ORDER — TRULICITY 0.75 MG/0.5ML ~~LOC~~ SOAJ: 0.7500 mg | SUBCUTANEOUS | 1 refills | Status: DC

## 2022-04-12 NOTE — Progress Notes (Signed)
New Patient Office Visit  Subjective    Patient ID: Mark Schneider, male    DOB: 1981-03-31  Age: 41 y.o. MRN: [id number]  CC:  Chief Complaint  Patient presents with   New Patient (Initial Visit)    Pt here for to establish new care, pt would like refills on his medications     HPI Mark Schneider presents to establish care Last PCP - Dr. Alvy Bimler, last visit 2 years ago, patient has been out of some of his medications recently  DM: Last hemoglobin A1c on file was more than 2 years ago.  Medications include metformin and glipizide.  He still has had some metformin remaining, has been out of glipizide.  Denies any current issues with polyuria, polydipsia, weight loss.  Requesting refill of medications today.  He does have questions about GLP-1 receptor agonist, specifically mentions Ozempic due to knowing friends and family who have utilized medication with good effect regarding blood sugar control and weight loss  HTN: Has been utilizing olmesartan in the past, does not check blood pressure regularly at home.  Denies any current issues with chest pain or headaches.  Requesting refill of olmesartan today, has been without medication currently  HLD: Has managed with rosuvastatin, requesting refill of medication today.  Denies any prior issues with myalgias, generally is tolerating medication well  Patient is originally from [place]. Patient works as a Teacher, adult education. Outside of work, he enjoys working out, spending time with family, traveling, being around water.  Outpatient Encounter Medications as of 04/12/2022  Medication Sig   Dulaglutide (TRULICITY) 0.75 MG/0.5ML SOPN Inject 0.75 mg into the skin once a week.   losartan (COZAAR) 50 MG tablet Take 1 tablet (50 mg total) by mouth daily.   [DISCONTINUED] dicyclomine (BENTYL) 20 MG tablet Take 1 tablet (20 mg total) by mouth 2 (two) times daily.   [DISCONTINUED] metFORMIN (GLUCOPHAGE) 1000 MG tablet Take 1  tablet (1,000 mg total) by mouth 2 (two) times daily with a meal.   [DISCONTINUED] ondansetron (ZOFRAN ODT) 4 MG disintegrating tablet Take 1 tablet (4 mg total) by mouth every 8 (eight) hours as needed.   [DISCONTINUED] rosuvastatin (CRESTOR) 10 MG tablet Take 1 tablet (10 mg total) by mouth daily.   glipiZIDE (GLUCOTROL) 5 MG tablet Take 1 tablet (5 mg total) by mouth 2 (two) times daily before a meal.   metFORMIN (GLUCOPHAGE) 1000 MG tablet Take 1 tablet (1,000 mg total) by mouth 2 (two) times daily with a meal.   rosuvastatin (CRESTOR) 10 MG tablet Take 1 tablet (10 mg total) by mouth daily.   [DISCONTINUED] glipiZIDE (GLUCOTROL) 5 MG tablet Take 1 tablet (5 mg total) by mouth 2 (two) times daily before a meal.   [DISCONTINUED] olmesartan (BENICAR) 20 MG tablet Take 1 tablet (20 mg total) by mouth daily.   No facility-administered encounter medications on file as of 04/12/2022.    Past Medical History:  Diagnosis Date   Diabetes mellitus without complication (HCC)    Hypertension     Past Surgical History:  Procedure Laterality Date   DENTAL SURGERY     TOE SURGERY      Family History  Problem Relation Age of Onset   Diabetes Mother    Diabetes Sister    Diabetes Maternal Grandmother    Diabetes Maternal Uncle    Diabetes Maternal Grandfather     Social History   Socioeconomic History   Marital status: Single    Spouse  name: Not on file   Number of children: Not on file   Years of education: Not on file   Highest education level: Not on file  Occupational History   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 [employer]  Tobacco Use   Smoking status: Never   Smokeless tobacco: Never  Vaping Use   Vaping Use: Never used  Substance and Sexual Activity   Alcohol use: No   Drug use: No   Sexual activity: Not Currently  Other Topics Concern   Not on file  Social History Narrative   Not on file   Social Determinants of Health   Financial Resource Strain: Not on file  Food  Insecurity: Not on file  Transportation Needs: Not on file  Physical Activity: Not on file  Stress: Not on file  Social Connections: Not on file  Intimate Partner Violence: Not on file    Objective    BP (!) 190/125   Pulse 90   Ht 5\' 10"  (1.778 m)   Wt (!) 378 lb (171.5 kg)   SpO2 97%   BMI 54.24 kg/m   Physical Exam  41 year old male in no acute distress Cardiovascular exam with regular rate and rhythm Lungs clear to auscultation bilaterally  Assessment & Plan:   Problem List Items Addressed This Visit       Cardiovascular and Mediastinum   Hypertension associated with diabetes (HCC)    Blood pressure notably elevated in office today, patient has been without antihypertensive.  He is not currently have any acute signs or symptoms We will allow for patient to resume ARB therapy, it does appear that losartan may be covered better than olmesartan by insurance and thus we will make this transition given that patient has not been taking medication currently Recommend intermittent monitoring of blood pressure at home, DASH diet We will monitor blood pressure closely in office and plan for follow-up in 1 to 2 weeks to assess progress with antihypertensive and need for any medication adjustments.  Did discuss that if still elevated, would likely plan to add second agent at low-dose to current losartan      Relevant Medications   Dulaglutide (TRULICITY) 0.75 MG/0.5ML SOPN   glipiZIDE (GLUCOTROL) 5 MG tablet   metFORMIN (GLUCOPHAGE) 1000 MG tablet   losartan (COZAAR) 50 MG tablet   rosuvastatin (CRESTOR) 10 MG tablet   RESOLVED: Primary hypertension   Relevant Medications   losartan (COZAAR) 50 MG tablet   rosuvastatin (CRESTOR) 10 MG tablet     Endocrine   Diabetes mellitus (HCC) - Primary    Uncertain control although there is some concern given that he has been without one of his medications.  Refill of medications today including metformin and glipizide.  We will also  check hemoglobin A1c and urine microalbumin/creatinine ratio to assess current status and need for any further pharmacotherapy We will also begin Trulicity after long discussion of GLP-1 receptor agonist, options, potential risks and side effects.  It does appear that this medication is covered best by insurance.  Reviewed general expectations with medication, potential side effects, need for gradual titration of medication every 1 to 2 months to minimize potential for adverse effects.  Also discussed ways to lower risk of developing any adverse effects He is not aware if he has had pneumococcal vaccination in the past, declines today, we will need to continue to assess willingness to have immunization completed, check at future visits Complete foot exam at future office visit, ensure patient is established to complete  retinopathy screening      Relevant Medications   Dulaglutide (TRULICITY) 3.34 DH/6.8SH SOPN   glipiZIDE (GLUCOTROL) 5 MG tablet   metFORMIN (GLUCOPHAGE) 1000 MG tablet   losartan (COZAAR) 50 MG tablet   rosuvastatin (CRESTOR) 10 MG tablet   Other Relevant Orders   Urine Microalbumin w/creat. ratio   Hemoglobin A1c     Other   Hyperlipidemia    Has tolerated rosuvastatin in the past, needing refill today Refilled rosuvastatin sent to pharmacy on file, plan to check lipid panel in the future with baseline labs/physical      Relevant Medications   losartan (COZAAR) 50 MG tablet   rosuvastatin (CRESTOR) 10 MG tablet    Return in about 2 weeks (around 04/26/2022) for DM, HTN.   Caitland Porchia J De [place], MD

## 2022-04-12 NOTE — Patient Instructions (Signed)
  Medication Instructions:  Your physician recommends that you continue on your current medications as directed. Please refer to the Current Medication list given to you today. --If you need a refill on any your medications before your next appointment, please call your pharmacy first. If no refills are authorized on file call the office.-- Lab Work: Your physician has recommended that you have lab work today: Yes If you have labs (blood work) drawn today and your tests are completely normal, you will receive your results via [facility] a phone call from our staff.  Please ensure you check your voicemail in the event that you authorized detailed messages to be left on a delegated number. If you have any lab test that is abnormal or we need to change your treatment, we will call you to review the results.  Referrals/Procedures/Imaging: No  Follow-Up: Your next appointment:   Your physician recommends that you schedule a follow-up appointment in: 1-2 weeks with Dr. de [place].  You will receive a text message or e-mail with a link to a survey about your care and experience with [place] today! We would greatly appreciate your feedback!   Thanks for letting us be apart of your health journey!!  Primary Care and Sports Medicine   Dr. Arlina Robes [place]   We encourage you to activate your patient portal called "MyChart".  Sign up information is provided on this After Visit Summary.  MyChart is used to connect with patients for Virtual Visits (Telemedicine).  Patients are able to view lab/test results, encounter notes, upcoming appointments, etc.  Non-urgent messages can be sent to your provider as well. To learn more about what you can do with MyChart, please visit --  [email or web address].

## 2022-04-13 DIAGNOSIS — I10 Essential (primary) hypertension: Secondary | ICD-10-CM | POA: Insufficient documentation

## 2022-04-13 LAB — MICROALBUMIN / CREATININE URINE RATIO
Creatinine, Urine: 168.5 mg/dL
Microalb/Creat Ratio: 44 mg/g creat — ABNORMAL HIGH (ref 0–29)
Microalbumin, Urine: 73.5 ug/mL

## 2022-04-13 LAB — HEMOGLOBIN A1C
Est. average glucose Bld gHb Est-mCnc: 148 mg/dL
Hgb A1c MFr Bld: 6.8 % — ABNORMAL HIGH (ref 4.8–5.6)

## 2022-04-13 NOTE — Assessment & Plan Note (Addendum)
Uncertain control although there is some concern given that he has been without one of his medications.  Refill of medications today including metformin and glipizide.  We will also check hemoglobin A1c and urine microalbumin/creatinine ratio to assess current status and need for any further pharmacotherapy We will also begin Trulicity after long discussion of GLP-1 receptor agonist, options, potential risks and side effects.  It does appear that this medication is covered best by insurance.  Reviewed general expectations with medication, potential side effects, need for gradual titration of medication every 1 to 2 months to minimize potential for adverse effects.  Also discussed ways to lower risk of developing any adverse effects He is not aware if he has had pneumococcal vaccination in the past, declines today, we will need to continue to assess willingness to have immunization completed, check at future visits Complete foot exam at future office visit, ensure patient is established to complete retinopathy screening

## 2022-04-13 NOTE — Assessment & Plan Note (Signed)
Blood pressure notably elevated in office today, patient has been without antihypertensive.  He is not currently have any acute signs or symptoms We will allow for patient to resume ARB therapy, it does appear that losartan may be covered better than olmesartan by insurance and thus we will make this transition given that patient has not been taking medication currently Recommend intermittent monitoring of blood pressure at home, DASH diet We will monitor blood pressure closely in office and plan for follow-up in 1 to 2 weeks to assess progress with antihypertensive and need for any medication adjustments.  Did discuss that if still elevated, would likely plan to add second agent at low-dose to current losartan

## 2022-04-13 NOTE — Assessment & Plan Note (Signed)
Has tolerated rosuvastatin in the past, needing refill today Refilled rosuvastatin sent to pharmacy on file, plan to check lipid panel in the future with baseline labs/physical

## 2022-05-01 ENCOUNTER — Encounter (HOSPITAL_BASED_OUTPATIENT_CLINIC_OR_DEPARTMENT_OTHER): Payer: Self-pay | Admitting: Family Medicine

## 2022-05-01 ENCOUNTER — Other Ambulatory Visit (HOSPITAL_BASED_OUTPATIENT_CLINIC_OR_DEPARTMENT_OTHER): Payer: Self-pay

## 2022-05-01 ENCOUNTER — Ambulatory Visit (INDEPENDENT_AMBULATORY_CARE_PROVIDER_SITE_OTHER): Payer: 59 | Admitting: Family Medicine

## 2022-05-01 VITALS — BP 179/116 | HR 100 | Temp 97.8°F | Ht 70.0 in | Wt 381.5 lb

## 2022-05-01 DIAGNOSIS — E785 Hyperlipidemia, unspecified: Secondary | ICD-10-CM

## 2022-05-01 DIAGNOSIS — Z23 Encounter for immunization: Secondary | ICD-10-CM

## 2022-05-01 DIAGNOSIS — E1159 Type 2 diabetes mellitus with other circulatory complications: Secondary | ICD-10-CM | POA: Diagnosis not present

## 2022-05-01 DIAGNOSIS — E119 Type 2 diabetes mellitus without complications: Secondary | ICD-10-CM

## 2022-05-01 DIAGNOSIS — I152 Hypertension secondary to endocrine disorders: Secondary | ICD-10-CM | POA: Diagnosis not present

## 2022-05-01 DIAGNOSIS — E1165 Type 2 diabetes mellitus with hyperglycemia: Secondary | ICD-10-CM

## 2022-05-01 MED ORDER — AMLODIPINE BESYLATE 5 MG PO TABS
5.0000 mg | ORAL_TABLET | Freq: Every day | ORAL | 1 refills | Status: DC
  Filled 2022-05-01 – 2022-05-26 (×2): qty 30, 30d supply, fill #0
  Filled 2022-06-28: qty 30, 30d supply, fill #1

## 2022-05-01 MED ORDER — TRULICITY 0.75 MG/0.5ML ~~LOC~~ SOAJ
0.7500 mg | SUBCUTANEOUS | 1 refills | Status: DC
  Filled 2022-05-01 – 2022-05-26 (×2): qty 2, 28d supply, fill #0
  Filled 2022-06-28: qty 2, 28d supply, fill #1

## 2022-05-01 NOTE — Assessment & Plan Note (Addendum)
Blood pressure is elevated in office today, is improved from last office visit.  He has been able to start with losartan, was taking olmesartan previously but this did not appear to be covered on current formulary.  Denies any issues with the medication, has been tolerating well.  Not checking blood pressure regularly at home right now.  Denies any issues with chest pain or headaches Discussed recommendations with patient today, would recommend beginning second antihypertensive medication at low-dose in order to try to achieve better blood pressure control while minimizing potential risks which can be seen with higher dosages of single antihypertensive agent, patient amenable to this..  We will look to start patient on amlodipine 5 mg once daily, cautioned on side effects including lower extremity edema, angioedema. Recommend intermittent monitoring of blood pressure at home, DASH diet We will plan for close follow-up to monitor progress

## 2022-05-01 NOTE — Assessment & Plan Note (Signed)
Currently taking rosuvastatin, thinks that it may have caused some GI upset for him otherwise has been tolerating medication.  Does not report any myalgias at this time.  We will continue with rosuvastatin and continue to monitor progress with medication

## 2022-05-01 NOTE — Progress Notes (Signed)
    Procedures performed today:    None.  Independent interpretation of notes and tests performed by another provider:   None.  Brief History, Exam, Impression, and Recommendations:    BP (!) 179/116   Pulse 100   Temp 97.8 F (36.6 C) (Oral)   Ht 5\' 10"  (1.778 m)   Wt (!) 381 lb 8 oz (173 kg)   SpO2 99%   BMI 54.74 kg/m   Diabetes mellitus (HCC) Recent hemoglobin A1c checked showed good control at 6.8%.  At that time, patient was not taking all of his necessary medications, however he has been able to resume medication at this time and has been doing well.  He does indicate that he has not picked up Trulicity, on review of chart, it appears that this may not have been transmitted to pharmacy electronically.  We again reviewed this medication today and he is interested in trying to proceed with this if possible, prescription for medication sent to pharmacy.  If not cost prohibitive, patient will look to start with this medication as soon as he is able to receive it. Otherwise, we will continue with pharmacotherapy as is. He did have a slight elevation in urine microalbumin/creatinine ratio and we will plan to recheck this again in a few months, reviewed this with patient Plan to complete foot exam at future office visit Additional consideration if not able to start GLP-1 receptor agonist includes starting SGLT2 inhibitor which can be helpful particularly in setting of moderately increased albuminuria and can also have some positive effect on weight loss  Hyperlipidemia Currently taking rosuvastatin, thinks that it may have caused some GI upset for him otherwise has been tolerating medication.  Does not report any myalgias at this time.  We will continue with rosuvastatin and continue to monitor progress with medication  Hypertension associated with diabetes ([place]) Blood pressure is elevated in office today, is improved from last office visit.  He has been able to start with losartan,  was taking olmesartan previously but this did not appear to be covered on current formulary.  Denies any issues with the medication, has been tolerating well.  Not checking blood pressure regularly at home right now.  Denies any issues with chest pain or headaches Discussed recommendations with patient today, would recommend beginning second antihypertensive medication at low-dose in order to try to achieve better blood pressure control while minimizing potential risks which can be seen with higher dosages of single antihypertensive agent, patient amenable to this..  We will look to start patient on amlodipine 5 mg once daily, cautioned on side effects including lower extremity edema, angioedema. Recommend intermittent monitoring of blood pressure at home, DASH diet We will plan for close follow-up to monitor progress  Return in about 4 weeks (around 05/29/2022) for HTN, DM. Recommend seasonal flu vaccine, patient amenable, administered today   ___________________________________________ Mark Alsteen de [place], MD, ABFM, CAQSM Primary [facility] and [place]

## 2022-05-01 NOTE — Patient Instructions (Addendum)
  Medication Instructions:  Your physician recommends that you continue on your current medications as directed. Please refer to the Current Medication list given to you today. --If you need a refill on any your medications before your next appointment, please call your pharmacy first. If no refills are authorized on file call the office.-- Lab Work: Your physician has recommended that you have lab work today: No If you have labs (blood work) drawn today and your tests are completely normal, you will receive your results via MyChart message OR a phone call from our staff.  Please ensure you check your voicemail in the event that you authorized detailed messages to be left on a delegated number. If you have any lab test that is abnormal or we need to change your treatment, we will call you to review the results.  Referrals/Procedures/Imaging: No  Follow-Up: Your next appointment:   Your physician recommends that you schedule a follow-up appointment in: 4 weeks with Dr. de [place].  You will receive a text message or e-mail with a link to a survey about your care and experience with us today! We would greatly appreciate your feedback!   Thanks for letting us be apart of your health journey!!  Primary Care and Sports Medicine   Dr. Raymond de [place]   We encourage you to activate your patient portal called "MyChart".  Sign up information is provided on this After Visit Summary.  MyChart is used to connect with patients for Virtual Visits (Telemedicine).  Patients are able to view lab/test results, encounter notes, upcoming appointments, etc.  Non-urgent messages can be sent to your provider as well. To learn more about what you can do with MyChart, please visit --  [email or web address]    

## 2022-05-01 NOTE — Assessment & Plan Note (Addendum)
Recent hemoglobin A1c checked showed good control at 6.8%.  At that time, patient was not taking all of his necessary medications, however he has been able to resume medication at this time and has been doing well.  He does indicate that he has not picked up Trulicity, on review of chart, it appears that this may not have been transmitted to pharmacy electronically.  We again reviewed this medication today and he is interested in trying to proceed with this if possible, prescription for medication sent to pharmacy.  If not cost prohibitive, patient will look to start with this medication as soon as he is able to receive it. Otherwise, we will continue with pharmacotherapy as is. He did have a slight elevation in urine microalbumin/creatinine ratio and we will plan to recheck this again in a few months, reviewed this with patient Plan to complete foot exam at future office visit Additional consideration if not able to start GLP-1 receptor agonist includes starting SGLT2 inhibitor which can be helpful particularly in setting of moderately increased albuminuria and can also have some positive effect on weight loss

## 2022-05-15 ENCOUNTER — Other Ambulatory Visit (HOSPITAL_BASED_OUTPATIENT_CLINIC_OR_DEPARTMENT_OTHER): Payer: Self-pay

## 2022-05-26 ENCOUNTER — Other Ambulatory Visit (HOSPITAL_BASED_OUTPATIENT_CLINIC_OR_DEPARTMENT_OTHER): Payer: Self-pay

## 2022-06-01 ENCOUNTER — Ambulatory Visit (HOSPITAL_BASED_OUTPATIENT_CLINIC_OR_DEPARTMENT_OTHER): Payer: 59 | Admitting: Family Medicine

## 2022-06-14 ENCOUNTER — Ambulatory Visit (HOSPITAL_BASED_OUTPATIENT_CLINIC_OR_DEPARTMENT_OTHER): Payer: 59 | Admitting: Family Medicine

## 2022-06-28 ENCOUNTER — Other Ambulatory Visit (HOSPITAL_BASED_OUTPATIENT_CLINIC_OR_DEPARTMENT_OTHER): Payer: Self-pay

## 2022-07-31 ENCOUNTER — Other Ambulatory Visit (HOSPITAL_BASED_OUTPATIENT_CLINIC_OR_DEPARTMENT_OTHER): Payer: Self-pay

## 2022-07-31 ENCOUNTER — Encounter (HOSPITAL_BASED_OUTPATIENT_CLINIC_OR_DEPARTMENT_OTHER): Payer: Self-pay | Admitting: Family Medicine

## 2022-07-31 ENCOUNTER — Ambulatory Visit (HOSPITAL_BASED_OUTPATIENT_CLINIC_OR_DEPARTMENT_OTHER): Payer: 59 | Admitting: Family Medicine

## 2022-07-31 VITALS — BP 154/93 | HR 96 | Ht 70.0 in | Wt 378.0 lb

## 2022-07-31 DIAGNOSIS — E1159 Type 2 diabetes mellitus with other circulatory complications: Secondary | ICD-10-CM

## 2022-07-31 DIAGNOSIS — I152 Hypertension secondary to endocrine disorders: Secondary | ICD-10-CM | POA: Diagnosis not present

## 2022-07-31 DIAGNOSIS — E119 Type 2 diabetes mellitus without complications: Secondary | ICD-10-CM | POA: Diagnosis not present

## 2022-07-31 MED ORDER — LOSARTAN POTASSIUM 100 MG PO TABS
100.0000 mg | ORAL_TABLET | Freq: Every day | ORAL | 1 refills | Status: DC
  Filled 2022-07-31 – 2022-08-21 (×2): qty 30, 30d supply, fill #0

## 2022-07-31 NOTE — Patient Instructions (Signed)
I have increased the losartan to 100 mg QD.  It is okay to take 2 of the 50 mg to equal the 100 mg.   Follow-up in one month for BP.   Consider getting a blood pressure cuff.

## 2022-07-31 NOTE — Assessment & Plan Note (Addendum)
Taking amlodipine 5 mg daily, losartan 50 mg daily.  He reports compliance with medication.  Denies chest pain, shortness of breath, lower extremity edema, vision changes and headaches.  His last BMP was in February 2023.  Will check a CMP today.  Blood pressure not at goal.  Improved with recheck still not at goal.  Will increase losartan to 100 mg daily, he will continue amlodipine 5 mg daily and he will follow-up in 1 month.  Encourage patient to get a blood pressure cuff appropriate for his arm size and to measure his blood pressure at home. Recommend DASH diet and moderate exercise.

## 2022-07-31 NOTE — Assessment & Plan Note (Signed)
Referral placed to healthy weight and wellness for medical weight loss.  Patient reports he has been going to the gym, unable to lose weight.  BMI is greater than 50.

## 2022-07-31 NOTE — Assessment & Plan Note (Signed)
>>  ASSESSMENT AND PLAN FOR MORBID [employer]ESITY WRITTEN ON 07/31/2022  4:48 PM BY DOLBY, KAREN R, FNP  Referral placed to healthy weight and wellness for medical weight loss.  Patient reports he has been going to the gym, unable to lose weight.  BMI is greater than 50.

## 2022-07-31 NOTE — Assessment & Plan Note (Signed)
Reports being on Trulicity for the past 2 months.  "It does not work "patient reports that he is looking for weight loss with his diabetic medication.  He denies chest pain, shortness of breath, vision changes, polydipsia, polyphagia, polyuria.  His last A1c in October was 6.8.  Will recheck today.  Reports monitoring his blood sugars at home, he is unable to give me any numbers.  He is currently taking metformin 1000 mg twice daily glipizide 5 mg twice daily, and Trulicity 4.82 mg weekly.  He will continue this regimen.  He is interested in medical weight loss referral.

## 2022-07-31 NOTE — Progress Notes (Signed)
Established Patient Office Visit  Subjective   Patient ID: Mark Schneider , male    DOB: 18-Aug-1980  Age: 42 y.o. MRN: [id number]  Chief Complaint  Patient presents with   Follow-up    Pt here for f/u on HTN and DM, pt only concern is Mark Schneider may have fluid on his left knee     HPI  Hypertension Medication compliance: taking amlodipine and losartan as prescribed. Denies chest pain, shortness of breath, lower extremity edema, vision changes, headaches.  Pertinent lab work: last BMP 08/2021.  Monitoring: does not monitor at home Tolerating medication well: no ankle edema, no side effects Mark Schneider is aware of.  Continue current medication regimen: increase losartan to 100 mg QD, continue amlodipine 5 mg QD.  Follow-up: 1 months  Diabetes: Medication compliance: started Trulicity for 2 months, "It doesn't work". Looking for weight loss.  Denies chest pain, shortness of breath, vision changes, polydipsia, polyphagia, polyuria.  Pertinent lab work: A1C: last A1C 6.8 in October, will recheck today.  Monitoring: blood sugar readings at home: checking at home: unable to tell me the numbers.         Continue current medication regimen: taking metformin and glipizide, Trulicity   Well controlled: will check A1C today. Continue with current medications.  Follow-up: 3 months    Review of Systems  Eyes:  Negative for blurred vision and double vision.  Respiratory:  Negative for shortness of breath.   Cardiovascular:  Negative for chest pain and leg swelling.  Gastrointestinal:  Negative for abdominal pain, nausea and vomiting.  Neurological:  Negative for dizziness and headaches.      Objective:     BP (!) 154/93   Pulse 96   Ht 5\' 10"  (1.778 m)   Wt (!) 378 lb (171.5 kg)   SpO2 98%   BMI 54.24 kg/m  BP Readings from Last 3 Encounters:  07/31/22 (!) 154/93  05/01/22 (!) 179/116  04/12/22 (!) 190/125      Physical Exam Vitals and nursing note reviewed.  Constitutional:       General: Mark Schneider is not in acute distress.    Appearance: Normal appearance. Mark Schneider is obese.  Cardiovascular:     Rate and Rhythm: Normal rate.  Pulmonary:     Effort: Pulmonary effort is normal.     Breath sounds: Normal breath sounds.  Skin:    General: Skin is warm and dry.  Neurological:     General: No focal deficit present.     Mental Status: Mark Schneider is alert. Mental status is at baseline.  Psychiatric:        Mood and Affect: Mood normal.        Behavior: Behavior normal.        Thought Content: Thought content normal.        Judgment: Judgment normal.      No results found for any visits on 07/31/22.  Last hemoglobin A1c Lab Results  Component Value Date   HGBA1C 6.8 (H) 04/12/2022      The 10-year ASCVD risk score (Arnett DK, et al., 2019) is: 12.2%    Assessment & Plan:   Problem List Items Addressed This Visit     Diabetes mellitus ([place]) - Primary    Reports being on Trulicity for the past 2 months.  "It does not work "patient reports that Mark Schneider is looking for weight loss with his diabetic medication.  Mark Schneider denies chest pain, shortness of breath, vision changes, polydipsia, polyphagia, polyuria.  His last  A1c in October was 6.8.  Will recheck today.  Reports monitoring his blood sugars at home, Mark Schneider is unable to give me any numbers.  Mark Schneider is currently taking metformin 1000 mg twice daily glipizide 5 mg twice daily, and Trulicity 5.03 mg weekly.  Mark Schneider will continue this regimen.  Mark Schneider is interested in medical weight loss referral.      Relevant Medications   losartan (COZAAR) 100 MG tablet   Other Relevant Orders   Comprehensive metabolic panel   Hemoglobin A1c   Microalbumin / creatinine urine ratio   Hypertension associated with diabetes (HCC)    Taking amlodipine 5 mg daily, losartan 50 mg daily.  Mark Schneider reports compliance with medication.  Denies chest pain, shortness of breath, lower extremity edema, vision changes and headaches.  His last BMP was in February 2023.  Will check a CMP  today.  Blood pressure not at goal.  Improved with recheck still not at goal.  Will increase losartan to 100 mg daily, Mark Schneider will continue amlodipine 5 mg daily and Mark Schneider will follow-up in 1 month.  Encourage patient to get a blood pressure cuff appropriate for his arm size and to measure his blood pressure at home. Recommend DASH diet and moderate exercise.       Relevant Medications   losartan (COZAAR) 100 MG tablet   Morbid obesity ([place])    Referral placed to healthy weight and wellness for medical weight loss.  Patient reports Mark Schneider has been going to the gym, unable to lose weight.  BMI is greater than 50.      Relevant Orders   Amb Ref to Medical Weight Management  Agrees with plan of care discussed.  Questions answered. Understands to increase losartan to 100 mg daily.  Mark Schneider will start this tomorrow.  Recommend DASH diet and moderate exercise.  Return in about 1 month (around 08/31/2022) for HTN.    Chalmers Guest, FNP

## 2022-08-02 LAB — COMPREHENSIVE METABOLIC PANEL
ALT: 20 IU/L (ref 0–44)
AST: 15 IU/L (ref 0–40)
Albumin/Globulin Ratio: 1.6 (ref 1.2–2.2)
Albumin: 4.5 g/dL (ref 4.1–5.1)
Alkaline Phosphatase: 66 IU/L (ref 44–121)
BUN/Creatinine Ratio: 9 (ref 9–20)
BUN: 15 mg/dL (ref 6–24)
Bilirubin Total: 0.3 mg/dL (ref 0.0–1.2)
CO2: 23 mmol/L (ref 20–29)
Calcium: 9.8 mg/dL (ref 8.7–10.2)
Chloride: 105 mmol/L (ref 96–106)
Creatinine, Ser: 1.75 mg/dL — ABNORMAL HIGH (ref 0.76–1.27)
Globulin, Total: 2.9 g/dL (ref 1.5–4.5)
Glucose: 75 mg/dL (ref 70–99)
Potassium: 4.4 mmol/L (ref 3.5–5.2)
Sodium: 142 mmol/L (ref 134–144)
Total Protein: 7.4 g/dL (ref 6.0–8.5)
eGFR: 50 mL/min/{1.73_m2} — ABNORMAL LOW (ref >59)

## 2022-08-02 LAB — MICROALBUMIN / CREATININE URINE RATIO
Creatinine, Urine: 202.4 mg/dL
Microalb/Creat Ratio: 37 mg/g creat — ABNORMAL HIGH (ref 0–29)
Microalbumin, Urine: 75.5 ug/mL

## 2022-08-02 LAB — HEMOGLOBIN A1C
Est. average glucose Bld gHb Est-mCnc: 134 mg/dL
Hgb A1c MFr Bld: 6.3 % — ABNORMAL HIGH (ref 4.8–5.6)

## 2022-08-03 ENCOUNTER — Other Ambulatory Visit: Payer: Self-pay | Admitting: Family Medicine

## 2022-08-03 DIAGNOSIS — E119 Type 2 diabetes mellitus without complications: Secondary | ICD-10-CM

## 2022-08-03 MED ORDER — EMPAGLIFLOZIN 10 MG PO TABS: 10.0000 mg | ORAL_TABLET | Freq: Every day | ORAL | 1 refills | Status: DC

## 2022-08-09 ENCOUNTER — Other Ambulatory Visit (HOSPITAL_BASED_OUTPATIENT_CLINIC_OR_DEPARTMENT_OTHER): Payer: Self-pay

## 2022-08-21 ENCOUNTER — Encounter (HOSPITAL_BASED_OUTPATIENT_CLINIC_OR_DEPARTMENT_OTHER): Payer: Self-pay | Admitting: Emergency Medicine

## 2022-08-21 ENCOUNTER — Other Ambulatory Visit (HOSPITAL_BASED_OUTPATIENT_CLINIC_OR_DEPARTMENT_OTHER): Payer: Self-pay | Admitting: Family Medicine

## 2022-08-21 ENCOUNTER — Other Ambulatory Visit: Payer: Self-pay

## 2022-08-21 ENCOUNTER — Emergency Department (HOSPITAL_BASED_OUTPATIENT_CLINIC_OR_DEPARTMENT_OTHER)
Admission: EM | Admit: 2022-08-21 | Discharge: 2022-08-21 | Disposition: A | Discharge status: Home / Self Care | Payer: 59 | Attending: Emergency Medicine | Admitting: Emergency Medicine

## 2022-08-21 ENCOUNTER — Other Ambulatory Visit (HOSPITAL_BASED_OUTPATIENT_CLINIC_OR_DEPARTMENT_OTHER): Payer: Self-pay

## 2022-08-21 DIAGNOSIS — J989 Respiratory disorder, unspecified: Secondary | ICD-10-CM | POA: Diagnosis not present

## 2022-08-21 DIAGNOSIS — Z7984 Long term (current) use of oral hypoglycemic drugs: Secondary | ICD-10-CM | POA: Diagnosis not present

## 2022-08-21 DIAGNOSIS — I10 Essential (primary) hypertension: Secondary | ICD-10-CM | POA: Diagnosis not present

## 2022-08-21 DIAGNOSIS — Z1152 Encounter for screening for COVID-19: Secondary | ICD-10-CM | POA: Diagnosis not present

## 2022-08-21 DIAGNOSIS — Z79899 Other long term (current) drug therapy: Secondary | ICD-10-CM | POA: Insufficient documentation

## 2022-08-21 DIAGNOSIS — B9789 Other viral agents as the cause of diseases classified elsewhere: Secondary | ICD-10-CM | POA: Insufficient documentation

## 2022-08-21 DIAGNOSIS — E1159 Type 2 diabetes mellitus with other circulatory complications: Secondary | ICD-10-CM

## 2022-08-21 DIAGNOSIS — E119 Type 2 diabetes mellitus without complications: Secondary | ICD-10-CM | POA: Diagnosis not present

## 2022-08-21 DIAGNOSIS — R059 Cough, unspecified: Secondary | ICD-10-CM | POA: Diagnosis present

## 2022-08-21 LAB — RESP PANEL BY RT-PCR (RSV, FLU A&B, COVID)  RVPGX2
Influenza A by PCR: NEGATIVE
Influenza B by PCR: NEGATIVE
Resp Syncytial Virus by PCR: NEGATIVE
SARS Coronavirus 2 by RT PCR: NEGATIVE

## 2022-08-21 MED ORDER — AMLODIPINE BESYLATE 5 MG PO TABS
5.0000 mg | ORAL_TABLET | Freq: Every day | ORAL | 1 refills | Status: DC
  Filled 2022-08-21 – 2022-09-15 (×2): qty 30, 30d supply, fill #0

## 2022-08-21 NOTE — ED Triage Notes (Signed)
Pt arrives to ED with c/o sore throat, runny nose, body aches x2 days.

## 2022-08-21 NOTE — ED Provider Notes (Signed)
[facility] Provider Note   CSN: [id number] Arrival date & time: 08/21/22  1347     History  Chief Complaint  Patient presents with   URI    Mark Schneider is a 42 y.o. male.  Patient is a 42 year old male who presents with 2-day history of a flulike illness.  He has had runny nose congestion and a little bit of coughing with some bodyaches and subjective fevers.  He denies any nausea vomiting.  No shortness of breath.  He has not had to take anything for the symptoms.  He does have a history on chart review of hypertension and diabetes with hyperlipidemia.       Home Medications Prior to Admission medications   Medication Sig Start Date End Date Taking? Authorizing Provider  amLODipine (NORVASC) 5 MG tablet Take 1 tablet (5 mg total) by mouth daily. 05/01/22   de [place], Blondell Reveal, MD  empagliflozin (JARDIANCE) 10 MG TABS tablet Take 1 tablet (10 mg total) by mouth daily before breakfast. 08/03/22   Chalmers Guest, FNP  glipiZIDE (GLUCOTROL) 5 MG tablet Take 1 tablet (5 mg total) by mouth 2 (two) times daily before a meal. 04/12/22 07/28/22  de [place], Blondell Reveal, MD  losartan (COZAAR) 100 MG tablet Take 1 tablet (100 mg total) by mouth daily. 07/31/22   Chalmers Guest, FNP  metFORMIN (GLUCOPHAGE) 1000 MG tablet Take 1 tablet (1,000 mg total) by mouth 2 (two) times daily with a meal. 04/12/22   [place], Blondell Reveal, MD  rosuvastatin (CRESTOR) 10 MG tablet Take 1 tablet (10 mg total) by mouth daily. 04/12/22   [place], Raymond J, MD      Allergies    Sheep-derived products and Shellfish allergy    Review of Systems   Review of Systems  Constitutional:  Positive for fatigue and fever. Negative for chills and diaphoresis.  HENT:  Positive for congestion and rhinorrhea. Negative for sneezing.   Eyes: Negative.   Respiratory:  Positive for cough. Negative for chest tightness and shortness of breath.   Cardiovascular:  Negative for chest pain  and leg swelling.  Gastrointestinal:  Negative for abdominal pain, blood in stool, diarrhea, nausea and vomiting.  Genitourinary:  Negative for difficulty urinating, flank pain, frequency and hematuria.  Musculoskeletal:  Positive for myalgias. Negative for arthralgias and back pain.  Skin:  Negative for rash.  Neurological:  Negative for dizziness, speech difficulty, weakness, numbness and headaches.    Physical Exam Updated Vital Signs BP (!) 166/93 (BP Location: Right Arm)   Pulse 95   Temp 98.6 F (37 C) (Temporal)   Resp 18   Ht 5' 10"$  (1.778 m)   Wt (!) 171 kg   SpO2 98%   BMI 54.09 kg/m  Physical Exam Constitutional:      Appearance: He is well-developed.  HENT:     Head: Normocephalic and atraumatic.     Nose: Nose normal.     Mouth/Throat:     Mouth: Mucous membranes are moist.  Eyes:     Pupils: Pupils are equal, round, and reactive to light.  Neck:     Comments: No meningismus Cardiovascular:     Rate and Rhythm: Normal rate and regular rhythm.     Heart sounds: Normal heart sounds.  Pulmonary:     Effort: Pulmonary effort is normal. No respiratory distress.     Breath sounds: Normal breath sounds. No wheezing or rales.  Chest:  Chest wall: No tenderness.  Abdominal:     General: Bowel sounds are normal.     Palpations: Abdomen is soft.     Tenderness: There is no abdominal tenderness. There is no guarding or rebound.  Musculoskeletal:        General: Normal range of motion.     Cervical back: Normal range of motion and neck supple.  Lymphadenopathy:     Cervical: No cervical adenopathy.  Skin:    General: Skin is warm and dry.     Findings: No rash.  Neurological:     Mental Status: He is alert and oriented to person, place, and time.     ED Results / Procedures / Treatments   Labs (all labs ordered are listed, but only abnormal results are displayed) Labs Reviewed  RESP PANEL BY RT-PCR (RSV, FLU A&B, COVID)  [id number]     EKG None  Radiology No results found.  Procedures Procedures    Medications Ordered in ED Medications - No data to display  ED Course/ Medical Decision Making/ A&P                             Medical Decision Making  Patient presents with a flulike illness.  He is overall well-appearing.  He has no signs of dehydration.  No clinical suggestions of pneumonia.  No clinical suggestions of meningitis.  He appears appropriate for outpatient treatment.  His COVID/flu and RSV testing was negative.  He was discharged home in good condition.  He was advised on symptomatic care instructions.  Return precautions were given.  Final Clinical Impression(s) / ED Diagnoses Final diagnoses:  Viral respiratory illness    Rx / DC Orders ED Discharge Orders     None         Malvin Johns, MD 08/21/22 1549

## 2022-08-21 NOTE — ED Notes (Signed)
Reviewed AVS/discharge instruction with patient. Time allotted for and all questions answered. Patient is agreeable for d/c and escorted to ed exit by staff.  

## 2022-08-28 ENCOUNTER — Other Ambulatory Visit (HOSPITAL_BASED_OUTPATIENT_CLINIC_OR_DEPARTMENT_OTHER): Payer: Self-pay

## 2022-08-28 ENCOUNTER — Ambulatory Visit (HOSPITAL_BASED_OUTPATIENT_CLINIC_OR_DEPARTMENT_OTHER): Payer: 59 | Admitting: Family Medicine

## 2022-09-14 ENCOUNTER — Ambulatory Visit (HOSPITAL_BASED_OUTPATIENT_CLINIC_OR_DEPARTMENT_OTHER): Payer: 59 | Admitting: Family Medicine

## 2022-09-15 ENCOUNTER — Other Ambulatory Visit (HOSPITAL_BASED_OUTPATIENT_CLINIC_OR_DEPARTMENT_OTHER): Payer: Self-pay

## 2022-09-25 ENCOUNTER — Other Ambulatory Visit: Payer: Self-pay

## 2022-09-25 ENCOUNTER — Encounter (HOSPITAL_COMMUNITY): Payer: Self-pay | Admitting: Emergency Medicine

## 2022-09-25 ENCOUNTER — Emergency Department (HOSPITAL_COMMUNITY)
Admission: EM | Admit: 2022-09-25 | Discharge: 2022-09-26 | Disposition: A | Discharge status: Home / Self Care | Payer: 59 | Attending: Emergency Medicine | Admitting: Emergency Medicine

## 2022-09-25 DIAGNOSIS — I10 Essential (primary) hypertension: Secondary | ICD-10-CM

## 2022-09-25 DIAGNOSIS — Z79899 Other long term (current) drug therapy: Secondary | ICD-10-CM | POA: Insufficient documentation

## 2022-09-25 DIAGNOSIS — L089 Local infection of the skin and subcutaneous tissue, unspecified: Secondary | ICD-10-CM | POA: Insufficient documentation

## 2022-09-25 DIAGNOSIS — Z7984 Long term (current) use of oral hypoglycemic drugs: Secondary | ICD-10-CM | POA: Insufficient documentation

## 2022-09-25 DIAGNOSIS — E119 Type 2 diabetes mellitus without complications: Secondary | ICD-10-CM | POA: Insufficient documentation

## 2022-09-25 HISTORY — DX: Type 2 diabetes mellitus without complications: E11.9

## 2022-09-25 LAB — BASIC METABOLIC PANEL
Anion gap: 9 (ref 5–15)
BUN: 16 mg/dL (ref 6–20)
CO2: 23 mmol/L (ref 22–32)
Calcium: 9 mg/dL (ref 8.9–10.3)
Chloride: 105 mmol/L (ref 98–111)
Creatinine, Ser: 1.42 mg/dL — ABNORMAL HIGH (ref 0.61–1.24)
GFR, Estimated: >60 mL/min (ref >60)
Glucose, Bld: 94 mg/dL (ref 70–99)
Potassium: 3.9 mmol/L (ref 3.5–5.1)
Sodium: 137 mmol/L (ref 135–145)

## 2022-09-25 LAB — CBC WITH DIFFERENTIAL/PLATELET
Abs Immature Granulocytes: 0.01 10*3/uL (ref 0.00–0.07)
Basophils Absolute: 0 10*3/uL (ref 0.0–0.1)
Basophils Relative: 0 %
Eosinophils Absolute: 0.2 10*3/uL (ref 0.0–0.5)
Eosinophils Relative: 3 %
HCT: 47.3 % (ref 39.0–52.0)
Hemoglobin: 15.5 g/dL (ref 13.0–17.0)
Immature Granulocytes: 0 %
Lymphocytes Relative: 21 %
Lymphs Abs: 1.5 10*3/uL (ref 0.7–4.0)
MCH: 29.7 pg (ref 26.0–34.0)
MCHC: 32.8 g/dL (ref 30.0–36.0)
MCV: 90.6 fL (ref 80.0–100.0)
Monocytes Absolute: 0.6 10*3/uL (ref 0.1–1.0)
Monocytes Relative: 8 %
Neutro Abs: 4.8 10*3/uL (ref 1.7–7.7)
Neutrophils Relative %: 68 %
Platelets: 189 10*3/uL (ref 150–400)
RBC: 5.22 MIL/uL (ref 4.22–5.81)
RDW: 13.3 % (ref 11.5–15.5)
WBC: 7.2 10*3/uL (ref 4.0–10.5)
nRBC: 0 % (ref 0.0–0.2)

## 2022-09-25 MED ORDER — CEPHALEXIN 500 MG PO CAPS
1000.0000 mg | ORAL_CAPSULE | Freq: Once | ORAL | Status: AC
Start: 2022-09-25 — End: 2022-09-26
  Administered 2022-09-26: 1000 mg via ORAL
  Filled 2022-09-25: qty 2

## 2022-09-25 MED ORDER — AMLODIPINE BESYLATE 10 MG PO TABS: 10.0000 mg | ORAL_TABLET | Freq: Every day | ORAL | 0 refills | Status: DC

## 2022-09-25 MED ORDER — CEPHALEXIN 500 MG PO CAPS
500.0000 mg | ORAL_CAPSULE | Freq: Four times a day (QID) | ORAL | 0 refills | Status: DC
Start: 2022-09-25 — End: 2022-10-09

## 2022-09-25 NOTE — ED Provider Notes (Signed)
[facility] DEPT Provider Note: Georgena Spurling, MD, FACEP  CSN: [id number] MRN: [id number] ARRIVAL: 09/25/22 at 2158 ROOM: [place]  Toe Pain and Hypertension   HISTORY OF PRESENT ILLNESS  09/25/22 11:05 PM Mark Schneider is a 42 y.o. male with a history of recurrent cellulitis of his feet.  He is here with pain and swelling of his left great toe that started last week.  He states he has never been told he has gout.  He has always been told this was cellulitis and it responds well to antibiotics.  He rates associated pain as a 9 out of 10.  He also feels like his blood pressure is elevated and in fact it was 216/138 on arrival he did take an extra amlodipine 5 mg around 8:30 PM.  On recheck his blood pressure is 155/95.  He denies headache, visual changes, chest pain or shortness of breath.   Past Medical History:  Diagnosis Date   DM (diabetes mellitus) ([place])    Hypertension     Past Surgical History:  Procedure Laterality Date   DENTAL SURGERY     TOE SURGERY      Family History  Problem Relation Age of Onset   Diabetes Mother    Diabetes Sister    Diabetes Maternal Grandmother    Diabetes Maternal Uncle    Diabetes Maternal Grandfather     Social History   Tobacco Use   Smoking status: Never   Smokeless tobacco: Never  Vaping Use   Vaping Use: Never used  Substance Use Topics   Alcohol use: No   Drug use: No    Prior to Admission medications   Medication Sig Start Date End Date Taking? Authorizing Provider  amLODipine (NORVASC) 10 MG tablet Take 1 tablet (10 mg total) by mouth daily. 09/25/22  Yes Olusegun Gerstenberger, MD  cephALEXin (KEFLEX) 500 MG capsule Take 1 capsule (500 mg total) by mouth 4 (four) times daily. 09/25/22  Yes Nichole Neyer, MD  empagliflozin (JARDIANCE) 10 MG TABS tablet Take 1 tablet (10 mg total) by mouth daily before breakfast. 08/03/22   Chalmers Guest, FNP  glipiZIDE (GLUCOTROL) 5 MG tablet Take 1 tablet (5 mg total)  by mouth 2 (two) times daily before a meal. 04/12/22 10/15/22  de [place], Blondell Reveal, MD  losartan (COZAAR) 100 MG tablet Take 1 tablet (100 mg total) by mouth daily. 07/31/22   Chalmers Guest, FNP  metFORMIN (GLUCOPHAGE) 1000 MG tablet Take 1 tablet (1,000 mg total) by mouth 2 (two) times daily with a meal. 04/12/22   de [place], Blondell Reveal, MD  rosuvastatin (CRESTOR) 10 MG tablet Take 1 tablet (10 mg total) by mouth daily. 04/12/22   de [place], Raymond J, MD    Allergies Sheep-derived products and Shellfish allergy   REVIEW OF SYSTEMS  Negative except as noted here or in the History of Present Illness.   PHYSICAL EXAMINATION  Initial Vital Signs Blood pressure (!) 155/95, pulse 95, temperature 98.7 F (37.1 C), temperature source Oral, resp. rate 18, SpO2 98 %.  Examination General: Well-developed, well-nourished male in no acute distress; appearance consistent with age of record HENT: normocephalic; atraumatic Eyes: Normal appearance Neck: supple Heart: regular rate and rhythm Lungs: clear to auscultation bilaterally Abdomen: soft; nondistended; nontender; bowel sounds present Extremities: No deformity; tenderness, swelling and mild warmth of left great toe Neurologic: Awake, alert and oriented; motor function intact in all extremities and symmetric; no facial droop Skin: Warm  and dry Psychiatric: Normal mood and affect   RESULTS  Summary of this visit's results, reviewed and interpreted by myself:   EKG Interpretation  Date/Time:    Ventricular Rate:    PR Interval:    QRS Duration:   QT Interval:    QTC Calculation:   R Axis:     Text Interpretation:         Laboratory Studies: Results for orders placed or performed during the hospital encounter of 09/25/22 (from the past 24 hour(s))  CBC with Differential     Status: None   Collection Time: 09/25/22 11:04 PM  Result Value Ref Range   WBC 7.2 4.0 - 10.5 K/uL   RBC 5.22 4.22 - 5.81 MIL/uL   Hemoglobin 15.5 13.0 -  17.0 g/dL   HCT 47.3 39.0 - 52.0 %   MCV 90.6 80.0 - 100.0 fL   MCH 29.7 26.0 - 34.0 pg   MCHC 32.8 30.0 - 36.0 g/dL   RDW 13.3 11.5 - 15.5 %   Platelets 189 150 - 400 K/uL   nRBC 0.0 0.0 - 0.2 %   Neutrophils Relative % 68 %   Neutro Abs 4.8 1.7 - 7.7 K/uL   Lymphocytes Relative 21 %   Lymphs Abs 1.5 0.7 - 4.0 K/uL   Monocytes Relative 8 %   Monocytes Absolute 0.6 0.1 - 1.0 K/uL   Eosinophils Relative 3 %   Eosinophils Absolute 0.2 0.0 - 0.5 K/uL   Basophils Relative 0 %   Basophils Absolute 0.0 0.0 - 0.1 K/uL   Immature Granulocytes 0 %   Abs Immature Granulocytes 0.01 0.00 - 0.07 K/uL  Basic metabolic panel     Status: Abnormal   Collection Time: 09/25/22 11:04 PM  Result Value Ref Range   Sodium 137 135 - 145 mmol/L   Potassium 3.9 3.5 - 5.1 mmol/L   Chloride 105 98 - 111 mmol/L   CO2 23 22 - 32 mmol/L   Glucose, Bld 94 70 - 99 mg/dL   BUN 16 6 - 20 mg/dL   Creatinine, Ser 1.42 (H) 0.61 - 1.24 mg/dL   Calcium 9.0 8.9 - 10.3 mg/dL   GFR, Estimated >60 >60 mL/min   Anion gap 9 5 - 15   Imaging Studies: No results found.  ED COURSE and MDM  Nursing notes, initial and subsequent vitals signs, including pulse oximetry, reviewed and interpreted by myself.  Vitals:   09/25/22 2211 09/25/22 2215 09/25/22 2231 09/25/22 2250  BP: (!) 216/138 (!) 208/130 (!) 215/130 (!) 155/95  Pulse: 96   95  Resp: 18   18  Temp: 98.7 F (37.1 C)     TempSrc: Oral     SpO2: 98%   98%   Medications  cephALEXin (KEFLEX) capsule 1,000 mg (has no administration in time range)    11:39 PM There is no evidence of acute endorgan damage on history or laboratory work.  The patient's creatinine is elevated but is within the range of creatinines he has had over the past several years.  His blood pressure clearly improved with an extra dose of amlodipine.  We will increase his daily dose to 10 mg and refer back to his primary care physician.  Although his toe pain and swelling are suspicious for  gout he insists that it is cellulitis and usually resolves with antibiotic treatment.  It does not appear to be as painful as I would expect with gout.  We will start him on Keflex.  PROCEDURES  Procedures   ED DIAGNOSES     ICD-10-CM   1. Toe infection  L08.9     2. Hypertension not at goal  I10          Jailin Manocchio, Jenny Reichmann, MD 09/25/22 2342

## 2022-09-25 NOTE — ED Triage Notes (Signed)
Pt from home c/o L great toe pain and hypertension. Pt states that he has a hx of cellulitis of bilateral feet since 2018. Symptoms started last week. L foot swollen and slightly red, no wounds noted. Denies fevers/chills, denies injury.trauma. States pain is worse with movement and ambulation. Pt also states that he cal tell his BP is elevated, but denies symptoms. Took amlodipine 5mg  around 2030. Pt A&Ox4.

## 2022-10-09 ENCOUNTER — Encounter: Payer: Self-pay | Admitting: Student

## 2022-10-09 ENCOUNTER — Ambulatory Visit (INDEPENDENT_AMBULATORY_CARE_PROVIDER_SITE_OTHER): Payer: Self-pay | Admitting: Student

## 2022-10-09 ENCOUNTER — Other Ambulatory Visit: Payer: Self-pay

## 2022-10-09 VITALS — BP 160/91 | HR 93 | Temp 97.8°F | Ht 70.0 in | Wt 384.6 lb

## 2022-10-09 DIAGNOSIS — E119 Type 2 diabetes mellitus without complications: Secondary | ICD-10-CM

## 2022-10-09 DIAGNOSIS — E1165 Type 2 diabetes mellitus with hyperglycemia: Secondary | ICD-10-CM

## 2022-10-09 DIAGNOSIS — R0683 Snoring: Secondary | ICD-10-CM

## 2022-10-09 DIAGNOSIS — Z6841 Body Mass Index (BMI) 40.0 and over, adult: Secondary | ICD-10-CM

## 2022-10-09 DIAGNOSIS — I152 Hypertension secondary to endocrine disorders: Secondary | ICD-10-CM

## 2022-10-09 DIAGNOSIS — Z7984 Long term (current) use of oral hypoglycemic drugs: Secondary | ICD-10-CM

## 2022-10-09 DIAGNOSIS — E1159 Type 2 diabetes mellitus with other circulatory complications: Secondary | ICD-10-CM

## 2022-10-09 LAB — POCT GLYCOSYLATED HEMOGLOBIN (HGB A1C): Hemoglobin A1C: 6 % — AB (ref 4.0–5.6)

## 2022-10-09 LAB — GLUCOSE, CAPILLARY: Glucose-Capillary: 70 mg/dL (ref 70–99)

## 2022-10-09 MED ORDER — SEMAGLUTIDE(0.25 OR 0.5MG/DOS) 2 MG/3ML ~~LOC~~ SOPN
0.2500 mg | PEN_INJECTOR | SUBCUTANEOUS | 0 refills | Status: DC
Start: 2022-10-09 — End: 2023-06-03

## 2022-10-09 MED ORDER — GLIPIZIDE 5 MG PO TABS
5.0000 mg | ORAL_TABLET | Freq: Two times a day (BID) | ORAL | 0 refills | Status: DC
Start: 2022-10-09 — End: 2023-06-03

## 2022-10-09 MED ORDER — AMLODIPINE BESYLATE 10 MG PO TABS: 10.0000 mg | ORAL_TABLET | Freq: Every day | ORAL | 0 refills | Status: DC

## 2022-10-09 MED ORDER — EMPAGLIFLOZIN 10 MG PO TABS: 10.0000 mg | ORAL_TABLET | Freq: Every day | ORAL | 1 refills | Status: DC

## 2022-10-09 MED ORDER — ACCU-CHEK GUIDE VI STRP
ORAL_STRIP | 12 refills | Status: DC
Start: 2022-10-09 — End: 2022-10-10

## 2022-10-09 MED ORDER — EMPAGLIFLOZIN 10 MG PO TABS: 10.0000 mg | ORAL_TABLET | Freq: Every day | ORAL | 3 refills | Status: DC

## 2022-10-09 MED ORDER — LOSARTAN POTASSIUM 100 MG PO TABS: 100.0000 mg | ORAL_TABLET | Freq: Every day | ORAL | 1 refills | Status: DC

## 2022-10-09 MED ORDER — ACCU-CHEK GUIDE ME W/DEVICE KIT
PACK | 3 refills | Status: DC
Start: 2022-10-09 — End: 2023-06-03

## 2022-10-09 MED ORDER — ACCU-CHEK SOFTCLIX LANCETS MISC
12 refills | Status: DC
Start: 2022-10-09 — End: 2023-06-03

## 2022-10-09 NOTE — Progress Notes (Addendum)
Subjective:  Mark Schneider is a 42 y.o. who presents to clinic for new patient visit to establish care.  Chief concern is her current episodes of pain in bilateral toes associated with redness, warmth, and swelling.  This last happened a week or 2 ago.  He was treated in the emergency department for cellulitis.  His condition improved with a short course of cephalexin.  He has been worked up for gout in the past, including with arthrocentesis but this condition was ruled out.  Not having symptoms now, but a concern because it is function limiting when it occurs.  Regarding weight, he has not had much success with medical management of weight loss.  He is interested in switching GLP-1 agonists.  He has never discussed bariatric surgery with his physicians, but has interest in learning more about this modality today.  He reports snoring and says that the past but when partners have commented that he chokes/gasps and has periods of apnea.  Review of Systems  Eyes:  Negative for blurred vision.  Skin:  Negative for rash.  Neurological:  Negative for headaches.    Patient Active Problem List   Diagnosis Date Noted   Snoring 10/09/2022   Hyperlipidemia 04/12/2022   Hypertension associated with diabetes 11/27/2019   Diabetes mellitus 11/15/2017   Body mass index (BMI) of 50-59.9 in adult 11/15/2017   Allergies: Shellfish  Family history: Reviewed in medical record. Notable for strong history of diabetes and high blood pressure. Reports lung cancer in his grandfather at age 78, leading to his demise. Also reports cancer in mom and sister, thinks they both had ovarian cancer.  Surgical history: Had an oral surgery and a toe surgery, both while he was in high school.  Social history: Reviewed in medical record. Lives in apartment with teenage daughters.  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  No reported food or housing insecurity.  Rare alcohol use.   Never smoker.  Does not currently use marijuana.  Objective:   Vitals:   10/09/22 1349 10/09/22 1434  BP: (!) 165/94 (!) 160/91  Pulse: (!) 107 93  Temp: 97.8 F (36.6 C)   TempSrc: Oral   SpO2: 96%   Weight: (!) 384 lb 9.6 oz (174.5 kg)   Height: 5\' 10"  (1.778 m)     Physical Exam Constitutional:      General: He is not in acute distress.    Appearance: Normal appearance.  Eyes:     Conjunctiva/sclera: Conjunctivae normal.  Cardiovascular:     Rate and Rhythm: Normal rate and regular rhythm.     Pulses: Normal pulses.     Heart sounds: No murmur heard. Pulmonary:     Effort: Pulmonary effort is normal.     Breath sounds: Normal breath sounds. No stridor. No wheezing.  Abdominal:     General: There is no distension.     Palpations: Abdomen is soft.     Tenderness: There is no abdominal tenderness.  Musculoskeletal:     Cervical back: Neck supple.     Right lower leg: No edema.     Left lower leg: No edema.  Lymphadenopathy:     Cervical: No cervical adenopathy.  Skin:    General: Skin is warm and dry.  Neurological:     Mental Status: He is alert. Mental status is at baseline.  Psychiatric:        Mood and Affect: Mood normal.  Behavior: Behavior normal.    Assessment & Plan:  The primary encounter diagnosis was Body mass index (BMI) of 50-59.9 in adult. Diagnoses of Type 2 diabetes mellitus without complication, without long-term current use of insulin, Hypertension associated with diabetes, Type 2 diabetes mellitus with hyperglycemia, without long-term current use of insulin, and Snoring were also pertinent to this visit.  Diabetes mellitus (HCC) Labs indicative of great glycemic control.  He requested a glucometer for home blood sugar monitoring.  Continue metformin, sulfonylurea, SGLT2 inhibitor, and GLP-1 agonist.  Notably, during testing of hemoglobin A1c, his blood sugar was noted to be 70.  He was given apple juice prior to discharge from the clinic.   Will discontinue glipizide.  Sent glucometer to [facility] per patient request but home monitoring is really not necessary for this person. - Empagliflozin 10 mg daily, refill sent - Metformin 1000 mg twice daily - Discontinue Trulicity in favor of Ozempic, 0.25 mg weekly - Stop glipizide   Hypertension associated with diabetes (HCC) Hypertensive today at 160s over 90s.  Has not been taking his losartan for few weeks.  Restart this medicine, continue calcium channel blocker. - Amlodipine 10 mg daily - Losartan 100 mg daily, sent to pharmacy  Body mass index (BMI) of 50-59.9 in adult BMI 55.18 kg/m.  Has not had much success of medical management of his weight.  Discussed bariatric surgery today.  This person is interested in learning more.  Referral to Dr. Pauline Good office placed.  Will also transition from Trulicity to Ozempic.  Plan follow-up in a few weeks and increase dose aggressively. - Ozempic 0.25 mg weekly, increase at follow-up  Snoring Associated with prior reports from bedroom partners of choking and gasping with apneic periods.  Definitely high risk for OSA, 6-7 on STOP-BANG. - Split night study with [facility].    Return in about 4 weeks (around 11/06/2022) for diabetes, weight loss, blood pressure.  Patient discussed with Dr. Raymond Gurney MD 10/09/2022, 3:19 PM  Pager: [phone]

## 2022-10-09 NOTE — Assessment & Plan Note (Addendum)
Labs indicative of great glycemic control.  He requested a glucometer for home blood sugar monitoring.  Continue metformin, sulfonylurea, SGLT2 inhibitor, and GLP-1 agonist.  Notably, during testing of hemoglobin A1c, his blood sugar was noted to be 70.  He was given apple juice prior to discharge from the clinic.  Will discontinue glipizide.  [facility] per patient request but home monitoring is really not necessary for this person. - Empagliflozin 10 mg daily, refill sent - Metformin 1000 mg twice daily - Discontinue Trulicity in favor of Ozempic, 0.25 mg weekly - Stop glipizide

## 2022-10-09 NOTE — Patient Instructions (Addendum)
Return in about 4 weeks (around 11/06/2022) for diabetes, weight loss, blood pressure.   Based on the results of your tests and your excellent blood sugar control, you can stop taking glipizide.  I will call you with the results of the following laboratory test(s):  Lab Orders         POC Hbg A1C      Expect a call from the following department(s):  Referral Orders         Amb Referral to Bariatric Surgery         Ambulatory referral to Ophthalmology     I have set you up for a sleep study with [facility] to evaluate for obstructive sleep apnea.  Please call our clinic at [phone] Monday through Friday from 9 am to 4 pm if you have questions or concerns about your health. If after hours or on the weekend, call the main hospital number and ask for the Internal Medicine Resident On-Call. If you need medication refills, please notify your pharmacy one week in advance and they will send [place] a request.   Best, Marrianne Mood, MD [facility]

## 2022-10-09 NOTE — Assessment & Plan Note (Signed)
BMI 55.18 kg/m.  Has not had much success of medical management of his weight.  Discussed bariatric surgery today.  This person is interested in learning more.  Referral to Dr. Pauline Good office placed.  Will also transition from Trulicity to Ozempic.  Plan follow-up in a few weeks and increase dose aggressively. - Ozempic 0.25 mg weekly, increase at follow-up

## 2022-10-09 NOTE — Assessment & Plan Note (Signed)
Hypertensive today at 160s over 90s.  Has not been taking his losartan for few weeks.  Restart this medicine, continue calcium channel blocker. - Amlodipine 10 mg daily - Losartan 100 mg daily, sent to pharmacy

## 2022-10-09 NOTE — Assessment & Plan Note (Signed)
Associated with prior reports from bedroom partners of choking and gasping with apneic periods.  Definitely high risk for OSA, 6-7 on STOP-BANG. - Split night study with [facility].

## 2022-10-10 ENCOUNTER — Telehealth: Payer: Self-pay

## 2022-10-10 MED ORDER — ACCU-CHEK GUIDE VI STRP: ORAL_STRIP | 12 refills | Status: DC

## 2022-10-10 NOTE — Telephone Encounter (Signed)
Incoming fax from pharmacy  Medication:ACCU CHEK test strips  Message to prescriber: "please specify how many times a day for day supply"

## 2022-10-10 NOTE — Addendum Note (Signed)
Addended by: Marrianne Mood on: 10/10/2022 02:12 PM   Modules accepted: Orders

## 2022-10-11 NOTE — Addendum Note (Signed)
Addended by: Debe Coder B on: 10/11/2022 03:48 PM   Modules accepted: Level of Service

## 2022-10-11 NOTE — Progress Notes (Signed)
Internal Medicine Clinic Attending  Case discussed with Dr. McLendon  at the time of the visit.  We reviewed the resident's history and exam and pertinent patient test results.  I agree with the assessment, diagnosis, and plan of care documented in the resident's note.  

## 2022-10-17 ENCOUNTER — Telehealth: Payer: Self-pay | Admitting: *Deleted

## 2022-10-17 NOTE — Telephone Encounter (Signed)
Call from patient states unable to afford his medication.  [place] Pepper will cost $3000.  Patient states has insurance.  Patient only paid $12 before.   [facility]  Patient has not shown them his [employer].  They were charging out of pocket cost.  RTC to patient will need to give insurance information to [facility].  RTC to patient message left that the Pharmacy needs his insurance information and to RTC to the Clinics.

## 2022-10-24 ENCOUNTER — Other Ambulatory Visit: Payer: Self-pay | Admitting: Student

## 2022-10-24 DIAGNOSIS — E1159 Type 2 diabetes mellitus with other circulatory complications: Secondary | ICD-10-CM

## 2022-11-02 ENCOUNTER — Encounter: Payer: Self-pay | Admitting: Student

## 2022-11-14 ENCOUNTER — Emergency Department (HOSPITAL_COMMUNITY): Payer: Self-pay

## 2022-11-14 ENCOUNTER — Encounter (HOSPITAL_COMMUNITY): Payer: Self-pay

## 2022-11-14 ENCOUNTER — Other Ambulatory Visit (HOSPITAL_BASED_OUTPATIENT_CLINIC_OR_DEPARTMENT_OTHER): Payer: Self-pay

## 2022-11-14 ENCOUNTER — Emergency Department (HOSPITAL_COMMUNITY)
Admission: EM | Admit: 2022-11-14 | Discharge: 2022-11-14 | Disposition: A | Discharge status: Home / Self Care | Payer: Self-pay | Attending: Emergency Medicine | Admitting: Emergency Medicine

## 2022-11-14 ENCOUNTER — Other Ambulatory Visit: Payer: Self-pay

## 2022-11-14 DIAGNOSIS — R1010 Upper abdominal pain, unspecified: Secondary | ICD-10-CM

## 2022-11-14 DIAGNOSIS — K381 Appendicular concretions: Secondary | ICD-10-CM | POA: Insufficient documentation

## 2022-11-14 DIAGNOSIS — E119 Type 2 diabetes mellitus without complications: Secondary | ICD-10-CM | POA: Insufficient documentation

## 2022-11-14 DIAGNOSIS — Z79899 Other long term (current) drug therapy: Secondary | ICD-10-CM | POA: Insufficient documentation

## 2022-11-14 DIAGNOSIS — K389 Disease of appendix, unspecified: Secondary | ICD-10-CM

## 2022-11-14 DIAGNOSIS — Z794 Long term (current) use of insulin: Secondary | ICD-10-CM | POA: Insufficient documentation

## 2022-11-14 DIAGNOSIS — K5790 Diverticulosis of intestine, part unspecified, without perforation or abscess without bleeding: Secondary | ICD-10-CM

## 2022-11-14 DIAGNOSIS — Z7984 Long term (current) use of oral hypoglycemic drugs: Secondary | ICD-10-CM | POA: Insufficient documentation

## 2022-11-14 DIAGNOSIS — K5730 Diverticulosis of large intestine without perforation or abscess without bleeding: Secondary | ICD-10-CM | POA: Insufficient documentation

## 2022-11-14 DIAGNOSIS — I10 Essential (primary) hypertension: Secondary | ICD-10-CM | POA: Insufficient documentation

## 2022-11-14 LAB — CBC WITH DIFFERENTIAL/PLATELET
Abs Immature Granulocytes: 0.01 10*3/uL (ref 0.00–0.07)
Basophils Absolute: 0 10*3/uL (ref 0.0–0.1)
Basophils Relative: 0 %
Eosinophils Absolute: 0.1 10*3/uL (ref 0.0–0.5)
Eosinophils Relative: 1 %
HCT: 47.3 % (ref 39.0–52.0)
Hemoglobin: 15.4 g/dL (ref 13.0–17.0)
Immature Granulocytes: 0 %
Lymphocytes Relative: 24 %
Lymphs Abs: 1.7 10*3/uL (ref 0.7–4.0)
MCH: 29.1 pg (ref 26.0–34.0)
MCHC: 32.6 g/dL (ref 30.0–36.0)
MCV: 89.4 fL (ref 80.0–100.0)
Monocytes Absolute: 0.7 10*3/uL (ref 0.1–1.0)
Monocytes Relative: 10 %
Neutro Abs: 4.6 10*3/uL (ref 1.7–7.7)
Neutrophils Relative %: 65 %
Platelets: 214 10*3/uL (ref 150–400)
RBC: 5.29 MIL/uL (ref 4.22–5.81)
RDW: 13.2 % (ref 11.5–15.5)
WBC: 7.1 10*3/uL (ref 4.0–10.5)
nRBC: 0 % (ref 0.0–0.2)

## 2022-11-14 LAB — COMPREHENSIVE METABOLIC PANEL
ALT: 16 U/L (ref 0–44)
AST: 13 U/L — ABNORMAL LOW (ref 15–41)
Albumin: 4.2 g/dL (ref 3.5–5.0)
Alkaline Phosphatase: 62 U/L (ref 38–126)
Anion gap: 9 (ref 5–15)
BUN: 18 mg/dL (ref 6–20)
CO2: 26 mmol/L (ref 22–32)
Calcium: 9.4 mg/dL (ref 8.9–10.3)
Chloride: 102 mmol/L (ref 98–111)
Creatinine, Ser: 1.81 mg/dL — ABNORMAL HIGH (ref 0.61–1.24)
GFR, Estimated: 48 mL/min — ABNORMAL LOW (ref >60)
Glucose, Bld: 158 mg/dL — ABNORMAL HIGH (ref 70–99)
Potassium: 3.8 mmol/L (ref 3.5–5.1)
Sodium: 137 mmol/L (ref 135–145)
Total Bilirubin: 1.1 mg/dL (ref 0.3–1.2)
Total Protein: 8.7 g/dL — ABNORMAL HIGH (ref 6.5–8.1)

## 2022-11-14 LAB — URINALYSIS, ROUTINE W REFLEX MICROSCOPIC
Bilirubin Urine: NEGATIVE
Glucose, UA: NEGATIVE mg/dL
Hgb urine dipstick: NEGATIVE
Ketones, ur: NEGATIVE mg/dL
Leukocytes,Ua: NEGATIVE
Nitrite: NEGATIVE
Protein, ur: NEGATIVE mg/dL
Specific Gravity, Urine: 1.015 (ref 1.005–1.030)
pH: 5 (ref 5.0–8.0)

## 2022-11-14 LAB — LIPASE, BLOOD: Lipase: 48 U/L (ref 11–51)

## 2022-11-14 MED ORDER — ONDANSETRON 4 MG PO TBDP
4.0000 mg | ORAL_TABLET | Freq: Three times a day (TID) | ORAL | 0 refills | Status: DC | PRN
  Filled 2022-11-14: qty 10, 4d supply, fill #0

## 2022-11-14 MED ORDER — LACTATED RINGERS IV BOLUS
1000.0000 mL | Freq: Once | INTRAVENOUS | Status: AC
  Administered 2022-11-14: 1000 mL via INTRAVENOUS

## 2022-11-14 MED ORDER — IOHEXOL 300 MG/ML  SOLN
100.0000 mL | Freq: Once | INTRAMUSCULAR | Status: AC | PRN
  Administered 2022-11-14: 100 mL via INTRAVENOUS

## 2022-11-14 MED ORDER — IOHEXOL 9 MG/ML PO SOLN: 500.0000 mL | ORAL | Status: DC

## 2022-11-14 MED ORDER — IBUPROFEN 800 MG PO TABS
800.0000 mg | ORAL_TABLET | Freq: Once | ORAL | Status: AC
  Administered 2022-11-14: 800 mg via ORAL
  Filled 2022-11-14: qty 1

## 2022-11-14 MED ORDER — ONDANSETRON HCL 4 MG/2ML IJ SOLN
4.0000 mg | Freq: Once | INTRAMUSCULAR | Status: AC
  Administered 2022-11-14: 4 mg via INTRAVENOUS
  Filled 2022-11-14: qty 2

## 2022-11-14 NOTE — Discharge Instructions (Addendum)
Your labs today indicate some mild dehydration.  Make sure you are drinking plenty of fluids.  You are being given a prescription for nausea medicine.  You may take Tylenol for pain.  Your CT scan did not show any evidence of an acute infection such as appendicitis or diverticulitis though it does show an appendicolith.  This is sometimes associated with an increased chance of appendicitis but there is no evidence of appendicitis today.  If you develop new or worsening abdominal pain, especially in the right lower abdomen, if you develop recurrent vomiting, fever, or any other new/concerning symptoms then return to the ER or call 911.

## 2022-11-14 NOTE — ED Provider Notes (Signed)
[place] EMERGENCY DEPARTMENT AT [facility] Provider Note   CSN: [id number] Arrival date & time: 11/14/22  2952     History  Chief Complaint  Patient presents with   Emesis   Fever    Mark Schneider is a 43 y.o. male.  HPI 42 year old male with a history of type 2 diabetes and hypertension presents with abdominal pain and vomiting.  He started having vomiting yesterday.  He thinks he vomited about 8 times throughout the day.  He did not have any diarrhea.  No cough, sore throat or urinary symptoms.  He has also developed abdominal pain.  He is not sure if it is related to needing to go to the bathroom versus throw up or if it is just a pain.  Primarily is in his upper abdomen.  He has not vomited since yesterday but the abdominal pain continues.  He had a fever last night of 101 and took some Tylenol around 10:30 PM.  His pain is about a 6 out of 10 right now.  No prior abdominal pathology or surgeries.  Home Medications Prior to Admission medications   Medication Sig Start Date End Date Taking? Authorizing Provider  amLODipine (NORVASC) 10 MG tablet Take 1 tablet (10 mg total) by mouth daily. 10/09/22  Yes Marrianne Mood, MD  empagliflozin (JARDIANCE) 10 MG TABS tablet Take 1 tablet (10 mg total) by mouth daily before breakfast. 10/09/22  Yes Marrianne Mood, MD  metFORMIN (GLUCOPHAGE) 1000 MG tablet Take 1 tablet (1,000 mg total) by mouth 2 (two) times daily with a meal. 04/12/22  Yes de [place], Buren Kos, MD  ondansetron (ZOFRAN-ODT) 4 MG disintegrating tablet Take 1 tablet (4 mg total) by mouth every 8 (eight) hours as needed for nausea or vomiting. 11/14/22  Yes Pricilla Loveless, MD  rosuvastatin (CRESTOR) 10 MG tablet Take 1 tablet (10 mg total) by mouth daily. 04/12/22  Yes de [place], Raymond J, MD  Semaglutide,0.25 or 0.5MG /DOS, 2 MG/3ML SOPN Inject 0.25 mg into the skin once a week. 10/09/22  Yes Marrianne Mood, MD  Accu-Chek Softclix Lancets lancets Use as  instructed Patient not taking: Reported on 11/14/2022 10/09/22   Marrianne Mood, MD  Blood Glucose Monitoring Suppl (ACCU-CHEK GUIDE ME) w/Device KIT Use in the morning to check fasting blood glucose. Patient not taking: Reported on 11/14/2022 10/09/22   Marrianne Mood, MD  glipiZIDE (GLUCOTROL) 5 MG tablet Take 1 tablet (5 mg total) by mouth 2 (two) times daily before a meal. Patient not taking: Reported on 11/14/2022 10/09/22 01/07/23  Marrianne Mood, MD  glucose blood (ACCU-CHEK GUIDE) test strip Use to check blood sugar once daily before the first meal of the day. Patient not taking: Reported on 11/14/2022 10/10/22   Marrianne Mood, MD  losartan (COZAAR) 100 MG tablet TAKE 1 TABLET BY MOUTH EVERY DAY Patient not taking: Reported on 11/14/2022 10/25/22   Marrianne Mood, MD      Allergies    Sheep-derived products and Shellfish allergy    Review of Systems   Review of Systems  Constitutional:  Positive for fever.  HENT:  Negative for sore throat.   Respiratory:  Negative for cough.   Gastrointestinal:  Positive for abdominal pain, nausea and vomiting.  Genitourinary:  Negative for dysuria.    Physical Exam Updated Vital Signs BP (!) 155/108   Pulse (!) 103   Temp 98.2 F (36.8 C) (Oral)   Resp 18   Ht 5' 10.5" (1.791 m)   Wt Marland Kitchen)  170.1 kg   SpO2 94%   BMI 53.05 kg/m  Physical Exam Vitals and nursing note reviewed.  Constitutional:      General: He is not in acute distress.    Appearance: He is well-developed. He is obese. He is not ill-appearing or diaphoretic.  HENT:     Head: Normocephalic and atraumatic.  Cardiovascular:     Rate and Rhythm: Normal rate and regular rhythm.     Heart sounds: Normal heart sounds.  Pulmonary:     Effort: Pulmonary effort is normal.     Breath sounds: Normal breath sounds. No wheezing or rales.  Abdominal:     Palpations: Abdomen is soft.     Tenderness: There is abdominal tenderness in the right upper quadrant, right lower quadrant,  epigastric area and left upper quadrant.  Skin:    General: Skin is warm and dry.  Neurological:     Mental Status: He is alert.     ED Results / Procedures / Treatments   Labs (all labs ordered are listed, but only abnormal results are displayed) Labs Reviewed  COMPREHENSIVE METABOLIC PANEL - Abnormal; Notable for the following components:      Result Value   Glucose, Bld 158 (*)    Creatinine, Ser 1.81 (*)    Total Protein 8.7 (*)    AST 13 (*)    GFR, Estimated 48 (*)    All other components within normal limits  LIPASE, BLOOD  URINALYSIS, ROUTINE W REFLEX MICROSCOPIC  CBC WITH DIFFERENTIAL/PLATELET    EKG None  Radiology CT ABDOMEN PELVIS W CONTRAST  Result Date: 11/14/2022 CLINICAL DATA:  Right lower quadrant abdominal pain. EXAM: CT ABDOMEN AND PELVIS WITH CONTRAST TECHNIQUE: Multidetector CT imaging of the abdomen and pelvis was performed using the standard protocol following bolus administration of intravenous contrast. RADIATION DOSE REDUCTION: This exam was performed according to the departmental dose-optimization program which includes automated exposure control, adjustment of the mA and/or kV according to patient size and/or use of iterative reconstruction technique. CONTRAST:  OMNIPAQUE IOHEXOL 300 MG/ML  SOLN COMPARISON:  None Available. FINDINGS: Lower chest: No acute abnormality. Hepatobiliary: No focal liver abnormality is seen. No gallstones, gallbladder wall thickening, or biliary dilatation. Pancreas: Unremarkable. No pancreatic ductal dilatation or surrounding inflammatory changes. Spleen: Normal. Adrenals/Urinary Tract: Adrenal glands are unremarkable. Kidneys are normal, without renal calculi, focal lesion, or hydronephrosis. Bladder is unremarkable. Stomach/Bowel: Normal stomach and duodenum. No dilated loops of small bowel. 4 mm appendicolith (axial image 44 series 2). Appendix is nondilated with no surrounding inflammation. Pancolonic diverticulosis  without surrounding inflammation to suggest acute diverticulitis. Vascular/Lymphatic: No significant vascular findings are present. No enlarged abdominal or pelvic lymph nodes. Reproductive: Prostate is unremarkable. Other: No abdominal wall hernia or abnormality. No abdominopelvic ascites. Musculoskeletal: Transitional lumbosacral anatomy with partial sacralization of the L5 vertebral segment and diminutive ribs at T12. Lower lumbar spondylosis with calcified disc osteophyte complex at L4-5. IMPRESSION: 1. No acute abnormality in the abdomen or pelvis. 4 mm appendicolith without evidence of acute appendicitis. 2. Pancolonic diverticulosis without surrounding inflammation to suggest acute diverticulitis. 3. Transitional lumbosacral anatomy. Lower lumbar spondylosis with calcified disc osteophyte complex at L4-5. Electronically Signed   By: Orvan Falconer M.D.   On: 11/14/2022 09:40    Procedures Procedures    Medications Ordered in ED Medications  ibuprofen (ADVIL) tablet 800 mg (800 mg Oral Given 11/14/22 0724)  ondansetron (ZOFRAN) injection 4 mg (4 mg Intravenous Given 11/14/22 0724)  lactated ringers bolus  1,000 mL (0 mLs Intravenous Stopped 11/14/22 0830)  lactated ringers bolus 1,000 mL (0 mLs Intravenous Stopped 11/14/22 0900)  iohexol (OMNIPAQUE) 300 MG/ML solution 100 mL (100 mLs Intravenous Contrast Given 11/14/22 1610)    ED Course/ Medical Decision Making/ A&P                             Medical Decision Making Amount and/or Complexity of Data Reviewed Labs: ordered.    Details: Normal WBC.  Mild bump in creatinine from 1.42 months ago to 1.8 today.  Normal LFTs/lipase Radiology: ordered and independent interpretation performed.    Details: No obvious appendicitis.  There does appear to be an appendicolith.  Risk Prescription drug management.   Patient presents with abdominal pain and vomiting.  He does have diffuse tenderness but does have focal tenderness in the right lower  quadrant.  After being given some supportive care treatment such as ibuprofen, Zofran, fluids, he was reassessed and still has tenderness including in the right lower quadrant.  Given this after discussing with patient CT was obtained.  No evidence of appendicitis or other obvious cause of his pain though there is an appendicolith.  However with no stranding/inflammation, normal WBC, and more diffuse pain I think appendicitis is pretty unlikely.  Likely a viral process.  He is feeling pretty well and was given IV fluids and appears stable for discharge home with return precautions.  Also made him aware of the diverticulosis.  Does not appear to have diverticulitis.  We did discuss return precautions especially concerning findings such as right lower quadrant pain and he will otherwise need to follow-up with his PCP.        Final Clinical Impression(s) / ED Diagnoses Final diagnoses:  Upper abdominal pain  Appendicolith  Diverticulosis    Rx / DC Orders ED Discharge Orders          Ordered    ondansetron (ZOFRAN-ODT) 4 MG disintegrating tablet  Every 8 hours PRN        11/14/22 1010              Pricilla Loveless, MD 11/14/22 1042

## 2022-11-14 NOTE — ED Triage Notes (Signed)
Patient reports not being able to eat since Sunday. Reports nausea and vomiting 7 times yesterday with fever 100.5. Patient took tylenol at 2230 lastnight. Denies nausea while in triage but feels "hot".

## 2022-11-15 ENCOUNTER — Encounter: Payer: Self-pay | Admitting: Student

## 2022-11-22 ENCOUNTER — Other Ambulatory Visit (HOSPITAL_BASED_OUTPATIENT_CLINIC_OR_DEPARTMENT_OTHER): Payer: Self-pay

## 2022-12-06 ENCOUNTER — Encounter: Payer: Self-pay | Admitting: Student

## 2023-03-19 ENCOUNTER — Encounter: Payer: Self-pay | Admitting: Internal Medicine

## 2023-03-19 NOTE — Progress Notes (Deleted)
   CC: Follow up  HPI:  [name] is a 42 y.o. with medical history of HTN, DMII presenting to [facility] for a follow up appointment. Last seen in 10/2022 and recommended one month follow up.   Please see problem-based list for further details, assessments, and plans.  Past Medical History:  Diagnosis Date   Acute renal failure (ARF) (HCC) 11/15/2017   DM (diabetes mellitus) (HCC)    Hypertension     Current Outpatient Medications (Endocrine & Metabolic):    empagliflozin (JARDIANCE) 10 MG TABS tablet, Take 1 tablet (10 mg total) by mouth daily before breakfast.   glipiZIDE (GLUCOTROL) 5 MG tablet, Take 1 tablet (5 mg total) by mouth 2 (two) times daily before a meal. (Patient not taking: Reported on 11/14/2022)   metFORMIN (GLUCOPHAGE) 1000 MG tablet, Take 1 tablet (1,000 mg total) by mouth 2 (two) times daily with a meal.   Semaglutide,0.25 or 0.5MG /DOS, 2 MG/3ML SOPN, Inject 0.25 mg into the skin once a week.  Current Outpatient Medications (Cardiovascular):    amLODipine (NORVASC) 10 MG tablet, Take 1 tablet (10 mg total) by mouth daily.   losartan (COZAAR) 100 MG tablet, TAKE 1 TABLET BY MOUTH EVERY DAY (Patient not taking: Reported on 11/14/2022)   rosuvastatin (CRESTOR) 10 MG tablet, Take 1 tablet (10 mg total) by mouth daily.     Current Outpatient Medications (Other):    Accu-Chek Softclix Lancets lancets, Use as instructed (Patient not taking: Reported on 11/14/2022)   Blood Glucose Monitoring Suppl (ACCU-CHEK GUIDE ME) w/Device KIT, Use in the morning to check fasting blood glucose. (Patient not taking: Reported on 11/14/2022)   glucose blood (ACCU-CHEK GUIDE) test strip, Use to check blood sugar once daily before the first meal of the day. (Patient not taking: Reported on 11/14/2022)   ondansetron (ZOFRAN-ODT) 4 MG disintegrating tablet, Take 1 tablet (4 mg total) by mouth every 8 (eight) hours as needed for nausea or vomiting.  Review of Systems:  Review of system  negative unless stated in the problem list or HPI.    Physical Exam:  There were no vitals filed for this visit. Physical Exam General: NAD HENT: NCAT Lungs: CTAB, no wheeze, rhonchi or rales.  Cardiovascular: Normal heart sounds, no r/m/g, 2+ pulses in all extremities. No LE edema Abdomen: No TTP, normal bowel sounds MSK: No asymmetry or muscle atrophy.  Skin: no lesions noted on exposed skin Neuro: Alert and oriented x4. CN grossly intact Psych: Normal mood and normal affect   Assessment & Plan:   No problem-specific Assessment & Plan notes found for this encounter.   See Encounters Tab for problem based charting.  Patient Discussed with Dr. {NAMES:[id number]::"Guilloud","Hoffman","Mullen","Narendra","Vincent","Machen","Lau","Hatcher","Williams"} Gwenevere Abbot, MD [facility]igh[facility] Bridegroom. [facility] Internal Medicine Residency, PGY-3

## 2023-05-07 IMAGING — DX DG TOE GREAT 2+V*L*
3 series · 3 of 3 positions shown · non-contrast
Comparison: Radiographs 11/14/2016

CLINICAL DATA: Pain and swelling of the left great toe.

EXAM:
LEFT GREAT TOE

[toe ap]
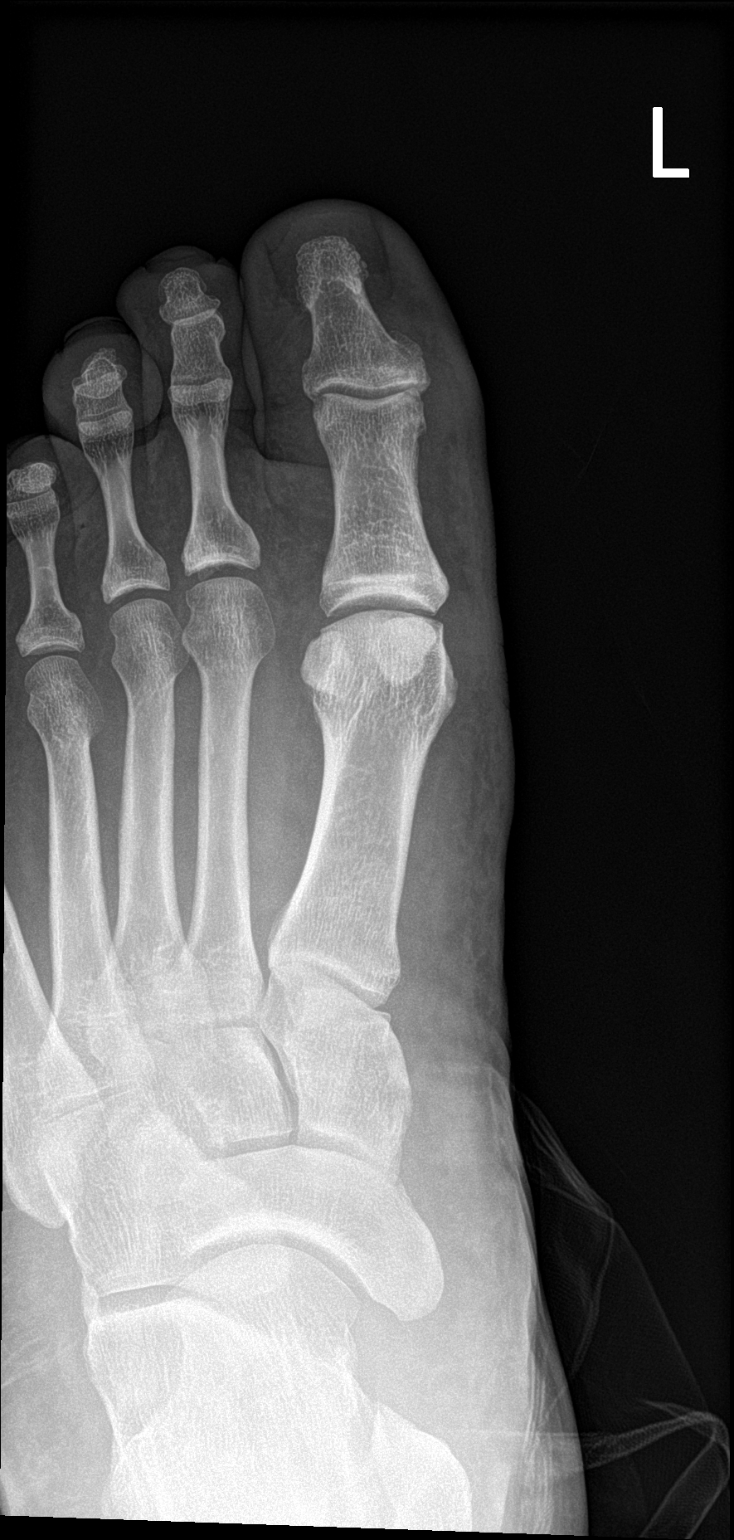

[toe obl]
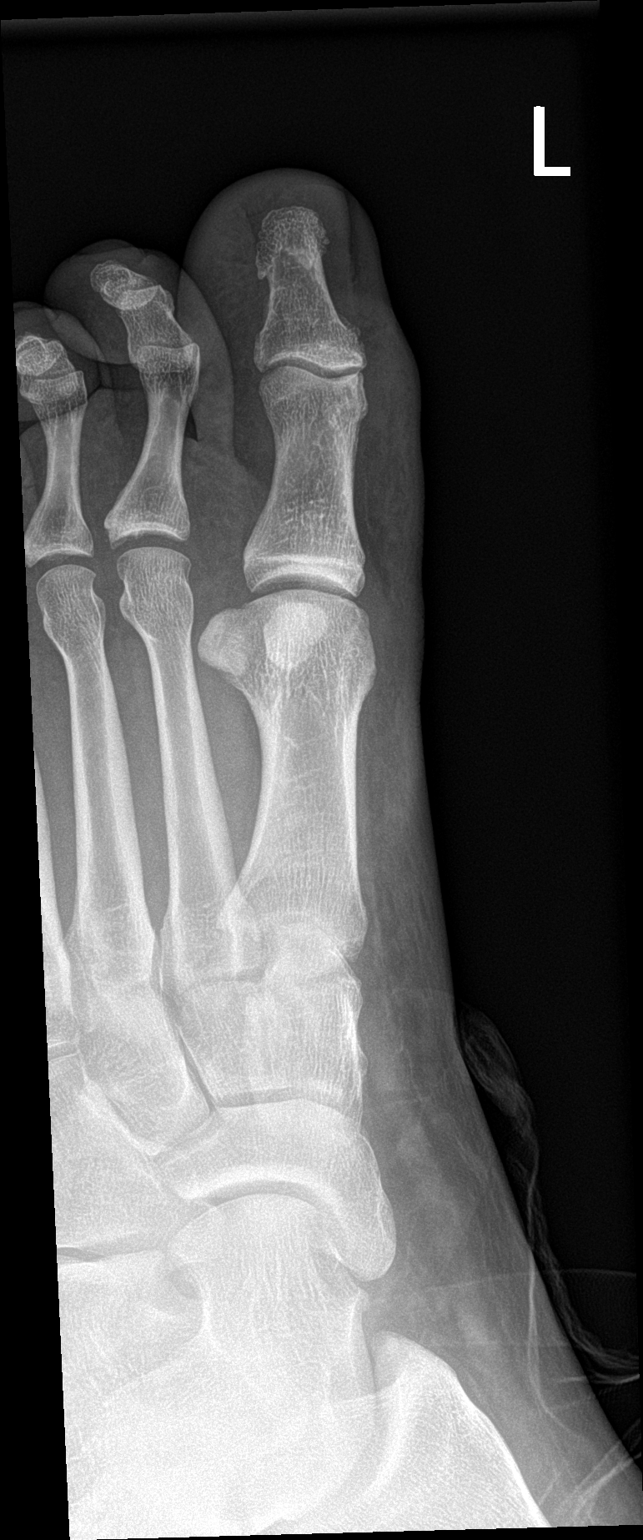

[toe lat]
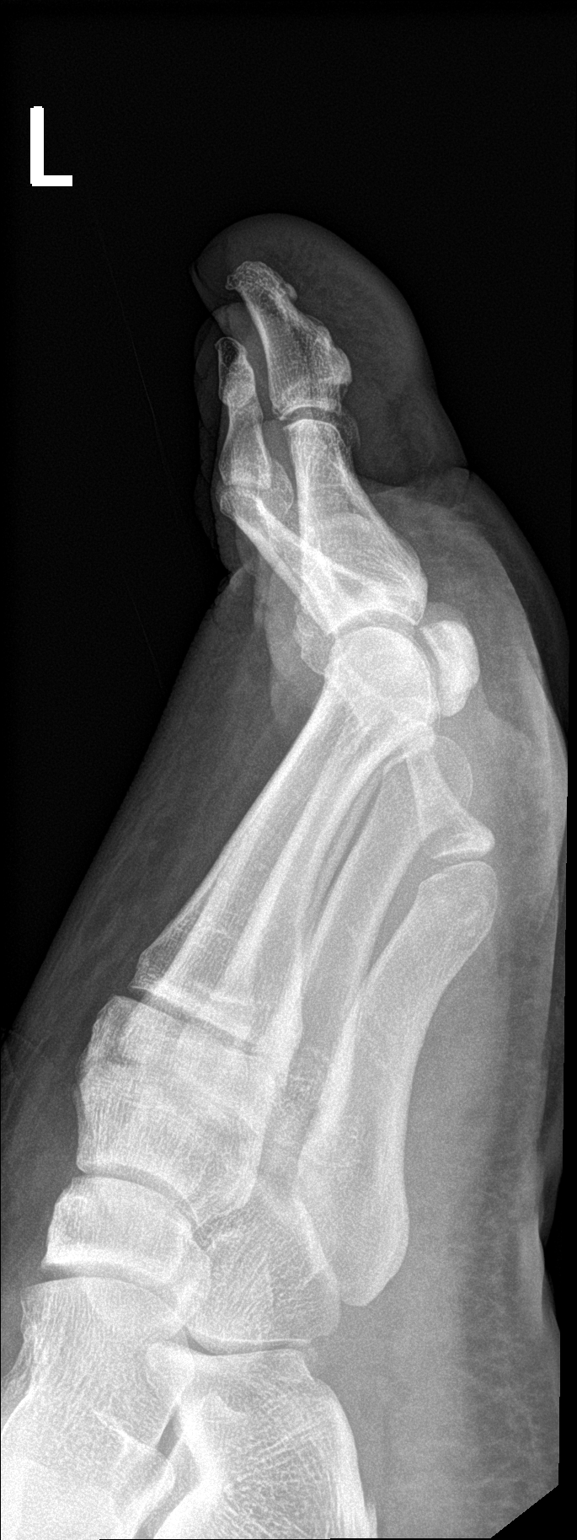

[3 of 3 positions shown; findings below may reference images not displayed]

FINDINGS: The joint spaces are maintained. No acute bony abnormality or
changes of an erosive arthropathy.
IMPRESSION: No acute bony findings.

## 2023-05-20 ENCOUNTER — Emergency Department (HOSPITAL_BASED_OUTPATIENT_CLINIC_OR_DEPARTMENT_OTHER)
Admission: EM | Admit: 2023-05-20 | Discharge: 2023-05-20 | Disposition: A | Discharge status: Home / Self Care | Payer: Self-pay | Attending: Emergency Medicine | Admitting: Emergency Medicine

## 2023-05-20 ENCOUNTER — Other Ambulatory Visit: Payer: Self-pay

## 2023-05-20 DIAGNOSIS — L600 Ingrowing nail: Secondary | ICD-10-CM | POA: Insufficient documentation

## 2023-05-20 DIAGNOSIS — I10 Essential (primary) hypertension: Secondary | ICD-10-CM | POA: Insufficient documentation

## 2023-05-20 DIAGNOSIS — Z7984 Long term (current) use of oral hypoglycemic drugs: Secondary | ICD-10-CM | POA: Insufficient documentation

## 2023-05-20 DIAGNOSIS — Z79899 Other long term (current) drug therapy: Secondary | ICD-10-CM | POA: Insufficient documentation

## 2023-05-20 MED ORDER — DOXYCYCLINE HYCLATE 100 MG PO CAPS: 100.0000 mg | ORAL_CAPSULE | Freq: Two times a day (BID) | ORAL | 0 refills | Status: DC

## 2023-05-20 NOTE — ED Provider Notes (Signed)
[place] EMERGENCY DEPARTMENT AT [facility] Provider Note   CSN: [id number] Arrival date & time: 05/20/23  1031     History  Chief Complaint  Patient presents with   Ingrown Toenail    Left foot    Hypertension    Mark Schneider is a 42 y.o. male.  42 year old male presents with left ingrown toenail.  It is at his left great toe.  Denies any fever or chills.  Patient states that there has been some drainage.  Denies any fever or chills.  Also notes a slight increase of his blood pressure but states that at times it is higher.       Home Medications Prior to Admission medications   Medication Sig Start Date End Date Taking? Authorizing Provider  amLODipine (NORVASC) 10 MG tablet Take 1 tablet (10 mg total) by mouth daily. 10/09/22  Yes Marrianne Mood, MD  metFORMIN (GLUCOPHAGE) 1000 MG tablet Take 1 tablet (1,000 mg total) by mouth 2 (two) times daily with a meal. 04/12/22  Yes de [place], Buren Kos, MD  rosuvastatin (CRESTOR) 10 MG tablet Take 1 tablet (10 mg total) by mouth daily. 04/12/22  Yes de [place], Raymond J, MD  Accu-Chek Softclix Lancets lancets Use as instructed Patient not taking: Reported on 11/14/2022 10/09/22   Marrianne Mood, MD  Blood Glucose Monitoring Suppl (ACCU-CHEK GUIDE ME) w/Device KIT Use in the morning to check fasting blood glucose. Patient not taking: Reported on 11/14/2022 10/09/22   Marrianne Mood, MD  empagliflozin (JARDIANCE) 10 MG TABS tablet Take 1 tablet (10 mg total) by mouth daily before breakfast. 10/09/22   Marrianne Mood, MD  glipiZIDE (GLUCOTROL) 5 MG tablet Take 1 tablet (5 mg total) by mouth 2 (two) times daily before a meal. Patient not taking: Reported on 11/14/2022 10/09/22 01/07/23  Marrianne Mood, MD  glucose blood (ACCU-CHEK GUIDE) test strip Use to check blood sugar once daily before the first meal of the day. Patient not taking: Reported on 11/14/2022 10/10/22   Marrianne Mood, MD  losartan (COZAAR) 100 MG tablet  TAKE 1 TABLET BY MOUTH EVERY DAY Patient not taking: Reported on 11/14/2022 10/25/22   Marrianne Mood, MD  ondansetron (ZOFRAN-ODT) 4 MG disintegrating tablet Take 1 tablet (4 mg total) by mouth every 8 (eight) hours as needed for nausea or vomiting. 11/14/22   Pricilla Loveless, MD  Semaglutide,0.25 or 0.5MG /DOS, 2 MG/3ML SOPN Inject 0.25 mg into the skin once a week. 10/09/22   Marrianne Mood, MD      Allergies    Sheep-derived products and Shellfish allergy    Review of Systems   Review of Systems  All other systems reviewed and are negative.   Physical Exam Updated Vital Signs BP (!) 170/105 (BP Location: Right Wrist)   Pulse 92   Temp 98 F (36.7 C)   Resp 18   Ht 1.778 m (5\' 10" )   Wt (!) 176.9 kg   SpO2 98%   BMI 55.96 kg/m  Physical Exam Vitals and nursing note reviewed.  Constitutional:      General: He is not in acute distress.    Appearance: Normal appearance. He is well-developed. He is not toxic-appearing.  HENT:     Head: Normocephalic and atraumatic.  Eyes:     General: Lids are normal.     Conjunctiva/sclera: Conjunctivae normal.     Pupils: Pupils are equal, round, and reactive to light.  Neck:     Thyroid: No thyroid mass.  Trachea: No tracheal deviation.  Cardiovascular:     Rate and Rhythm: Normal rate and regular rhythm.     Heart sounds: Normal heart sounds. No murmur heard.    No gallop.  Pulmonary:     Effort: Pulmonary effort is normal. No respiratory distress.     Breath sounds: Normal breath sounds. No stridor. No decreased breath sounds, wheezing, rhonchi or rales.  Abdominal:     General: There is no distension.     Palpations: Abdomen is soft.     Tenderness: There is no abdominal tenderness. There is no rebound.  Musculoskeletal:        General: No tenderness. Normal range of motion.     Cervical back: Normal range of motion and neck supple.       Legs:  Skin:    General: Skin is warm and dry.     Findings: No abrasion or  rash.  Neurological:     Mental Status: He is alert and oriented to person, place, and time. Mental status is at baseline.     GCS: GCS eye subscore is 4. GCS verbal subscore is 5. GCS motor subscore is 6.     Cranial Nerves: Cranial nerves are intact. No cranial nerve deficit.     Sensory: No sensory deficit.     Motor: Motor function is intact.  Psychiatric:        Attention and Perception: Attention normal.        Speech: Speech normal.        Behavior: Behavior normal.     ED Results / Procedures / Treatments   Labs (all labs ordered are listed, but only abnormal results are displayed) Labs Reviewed - No data to display  EKG None  Radiology No results found.  Procedures Procedures    Medications Ordered in ED Medications - No data to display  ED Course/ Medical Decision Making/ A&P                                 Medical Decision Making  Patient is pressure noted here but no acute intervention required due to he is asymptomatic.  He will need to follow-up with his doctor for this.  Toe has evidence of likely draining focal abscess.  No indication for draining at this time.  Does have evidence of ingrown toe and will give referral to podiatry.  Will also place on antibiotics.        Final Clinical Impression(s) / ED Diagnoses Final diagnoses:  None    Rx / DC Orders ED Discharge Orders     None         Lorre Nick, MD 05/20/23 1223

## 2023-05-20 NOTE — ED Triage Notes (Signed)
Patient arrives with complaints of ingrown toe nail on his left foot x2 weeks. Reports no pain Patient also reports concerns regarding his blood pressure being elevated. He is on blood pressure medicine, but unsure if the medicine is working.

## 2023-06-02 ENCOUNTER — Other Ambulatory Visit: Payer: Self-pay

## 2023-06-02 ENCOUNTER — Emergency Department (HOSPITAL_BASED_OUTPATIENT_CLINIC_OR_DEPARTMENT_OTHER): Payer: Self-pay

## 2023-06-02 ENCOUNTER — Inpatient Hospital Stay (HOSPITAL_BASED_OUTPATIENT_CLINIC_OR_DEPARTMENT_OTHER)
Admission: EM | Admit: 2023-06-02 | Discharge: 2023-06-03 | DRG: 638 | Disposition: A | Discharge status: Home / Self Care | Payer: Self-pay | Attending: Internal Medicine | Admitting: Internal Medicine

## 2023-06-02 ENCOUNTER — Emergency Department (HOSPITAL_BASED_OUTPATIENT_CLINIC_OR_DEPARTMENT_OTHER): Payer: Self-pay | Admitting: Radiology

## 2023-06-02 ENCOUNTER — Encounter (HOSPITAL_BASED_OUTPATIENT_CLINIC_OR_DEPARTMENT_OTHER): Payer: Self-pay | Admitting: Emergency Medicine

## 2023-06-02 DIAGNOSIS — Z91138 Patient's unintentional underdosing of medication regimen for other reason: Secondary | ICD-10-CM

## 2023-06-02 DIAGNOSIS — E860 Dehydration: Secondary | ICD-10-CM | POA: Diagnosis present

## 2023-06-02 DIAGNOSIS — E785 Hyperlipidemia, unspecified: Secondary | ICD-10-CM

## 2023-06-02 DIAGNOSIS — N179 Acute kidney failure, unspecified: Secondary | ICD-10-CM

## 2023-06-02 DIAGNOSIS — E1159 Type 2 diabetes mellitus with other circulatory complications: Secondary | ICD-10-CM

## 2023-06-02 DIAGNOSIS — R1030 Lower abdominal pain, unspecified: Secondary | ICD-10-CM

## 2023-06-02 DIAGNOSIS — Z79899 Other long term (current) drug therapy: Secondary | ICD-10-CM

## 2023-06-02 DIAGNOSIS — Z7985 Long-term (current) use of injectable non-insulin antidiabetic drugs: Secondary | ICD-10-CM

## 2023-06-02 DIAGNOSIS — I129 Hypertensive chronic kidney disease with stage 1 through stage 4 chronic kidney disease, or unspecified chronic kidney disease: Secondary | ICD-10-CM | POA: Diagnosis present

## 2023-06-02 DIAGNOSIS — Z1152 Encounter for screening for COVID-19: Secondary | ICD-10-CM

## 2023-06-02 DIAGNOSIS — Z91014 Allergy to mammalian meats: Secondary | ICD-10-CM

## 2023-06-02 DIAGNOSIS — E1165 Type 2 diabetes mellitus with hyperglycemia: Secondary | ICD-10-CM

## 2023-06-02 DIAGNOSIS — E1100 Type 2 diabetes mellitus with hyperosmolarity without nonketotic hyperglycemic-hyperosmolar coma (NKHHC): Principal | ICD-10-CM

## 2023-06-02 DIAGNOSIS — E1122 Type 2 diabetes mellitus with diabetic chronic kidney disease: Secondary | ICD-10-CM | POA: Diagnosis present

## 2023-06-02 DIAGNOSIS — E1110 Type 2 diabetes mellitus with ketoacidosis without coma: Principal | ICD-10-CM | POA: Diagnosis present

## 2023-06-02 DIAGNOSIS — Z9109 Other allergy status, other than to drugs and biological substances: Secondary | ICD-10-CM

## 2023-06-02 DIAGNOSIS — N1832 Chronic kidney disease, stage 3b: Secondary | ICD-10-CM | POA: Diagnosis present

## 2023-06-02 DIAGNOSIS — E875 Hyperkalemia: Secondary | ICD-10-CM | POA: Diagnosis present

## 2023-06-02 DIAGNOSIS — Z6841 Body Mass Index (BMI) 40.0 and over, adult: Secondary | ICD-10-CM

## 2023-06-02 DIAGNOSIS — Z833 Family history of diabetes mellitus: Secondary | ICD-10-CM

## 2023-06-02 DIAGNOSIS — I7389 Other specified peripheral vascular diseases: Principal | ICD-10-CM

## 2023-06-02 DIAGNOSIS — Z91013 Allergy to seafood: Secondary | ICD-10-CM

## 2023-06-02 DIAGNOSIS — T383X6A Underdosing of insulin and oral hypoglycemic [antidiabetic] drugs, initial encounter: Secondary | ICD-10-CM | POA: Diagnosis present

## 2023-06-02 DIAGNOSIS — J069 Acute upper respiratory infection, unspecified: Secondary | ICD-10-CM | POA: Diagnosis present

## 2023-06-02 DIAGNOSIS — E66813 Obesity, class 3: Secondary | ICD-10-CM | POA: Diagnosis present

## 2023-06-02 DIAGNOSIS — Z7984 Long term (current) use of oral hypoglycemic drugs: Secondary | ICD-10-CM

## 2023-06-02 LAB — CBC WITH DIFFERENTIAL/PLATELET
Abs Immature Granulocytes: 0.02 10*3/uL (ref 0.00–0.07)
Basophils Absolute: 0.1 10*3/uL (ref 0.0–0.1)
Basophils Relative: 1 %
Eosinophils Absolute: 0.3 10*3/uL (ref 0.0–0.5)
Eosinophils Relative: 4 %
HCT: 47.3 % (ref 39.0–52.0)
Hemoglobin: 16.1 g/dL (ref 13.0–17.0)
Immature Granulocytes: 0 %
Lymphocytes Relative: 12 %
Lymphs Abs: 1 10*3/uL (ref 0.7–4.0)
MCH: 29.5 pg (ref 26.0–34.0)
MCHC: 34 g/dL (ref 30.0–36.0)
MCV: 86.8 fL (ref 80.0–100.0)
Monocytes Absolute: 0.8 10*3/uL (ref 0.1–1.0)
Monocytes Relative: 10 %
Neutro Abs: 5.7 10*3/uL (ref 1.7–7.7)
Neutrophils Relative %: 73 %
Platelets: 196 10*3/uL (ref 150–400)
RBC: 5.45 MIL/uL (ref 4.22–5.81)
RDW: 12.8 % (ref 11.5–15.5)
WBC: 7.8 10*3/uL (ref 4.0–10.5)
nRBC: 0 % (ref 0.0–0.2)

## 2023-06-02 LAB — URINALYSIS, ROUTINE W REFLEX MICROSCOPIC
Bacteria, UA: NONE SEEN
Bilirubin Urine: NEGATIVE
Glucose, UA: >1000 mg/dL — AB
Hgb urine dipstick: NEGATIVE
Ketones, ur: 15 mg/dL — AB
Leukocytes,Ua: NEGATIVE
Nitrite: NEGATIVE
Protein, ur: NEGATIVE mg/dL
Specific Gravity, Urine: 1.03 (ref 1.005–1.030)
pH: 5 (ref 5.0–8.0)

## 2023-06-02 LAB — TROPONIN I (HIGH SENSITIVITY)
Troponin I (High Sensitivity): 11 ng/L (ref <18)
Troponin I (High Sensitivity): 14 ng/L (ref <18)

## 2023-06-02 LAB — COMPREHENSIVE METABOLIC PANEL
ALT: 16 U/L (ref 0–44)
AST: 18 U/L (ref 15–41)
Albumin: 4.1 g/dL (ref 3.5–5.0)
Alkaline Phosphatase: 92 U/L (ref 38–126)
Anion gap: 19 — ABNORMAL HIGH (ref 5–15)
BUN: 33 mg/dL — ABNORMAL HIGH (ref 6–20)
CO2: 22 mmol/L (ref 22–32)
Calcium: 10 mg/dL (ref 8.9–10.3)
Chloride: 89 mmol/L — ABNORMAL LOW (ref 98–111)
Creatinine, Ser: 2.46 mg/dL — ABNORMAL HIGH (ref 0.61–1.24)
GFR, Estimated: 33 mL/min — ABNORMAL LOW (ref >60)
Glucose, Bld: 754 mg/dL (ref 70–99)
Potassium: 5.3 mmol/L — ABNORMAL HIGH (ref 3.5–5.1)
Sodium: 130 mmol/L — ABNORMAL LOW (ref 135–145)
Total Bilirubin: 0.6 mg/dL (ref <1.2)
Total Protein: 8.3 g/dL — ABNORMAL HIGH (ref 6.5–8.1)

## 2023-06-02 LAB — RESP PANEL BY RT-PCR (RSV, FLU A&B, COVID)  RVPGX2
Influenza A by PCR: NEGATIVE
Influenza B by PCR: NEGATIVE
Resp Syncytial Virus by PCR: NEGATIVE
SARS Coronavirus 2 by RT PCR: NEGATIVE

## 2023-06-02 LAB — BASIC METABOLIC PANEL
Anion gap: 17 — ABNORMAL HIGH (ref 5–15)
BUN: 31 mg/dL — ABNORMAL HIGH (ref 6–20)
CO2: 22 mmol/L (ref 22–32)
Calcium: 9.2 mg/dL (ref 8.9–10.3)
Chloride: 94 mmol/L — ABNORMAL LOW (ref 98–111)
Creatinine, Ser: 2.31 mg/dL — ABNORMAL HIGH (ref 0.61–1.24)
GFR, Estimated: 36 mL/min — ABNORMAL LOW (ref >60)
Glucose, Bld: 630 mg/dL (ref 70–99)
Potassium: 5.8 mmol/L — ABNORMAL HIGH (ref 3.5–5.1)
Sodium: 133 mmol/L — ABNORMAL LOW (ref 135–145)

## 2023-06-02 LAB — I-STAT VENOUS BLOOD GAS, ED
Acid-Base Excess: 2 mmol/L (ref 0.0–2.0)
Bicarbonate: 28.5 mmol/L — ABNORMAL HIGH (ref 20.0–28.0)
Calcium, Ion: 1.16 mmol/L (ref 1.15–1.40)
HCT: 49 % (ref 39.0–52.0)
Hemoglobin: 16.7 g/dL (ref 13.0–17.0)
O2 Saturation: 43 %
Patient temperature: 98
Potassium: 5.8 mmol/L — ABNORMAL HIGH (ref 3.5–5.1)
Sodium: 128 mmol/L — ABNORMAL LOW (ref 135–145)
TCO2: 30 mmol/L (ref 22–32)
pCO2, Ven: 50.8 mm[Hg] (ref 44–60)
pH, Ven: 7.355 (ref 7.25–7.43)
pO2, Ven: 25 mm[Hg] — CL (ref 32–45)

## 2023-06-02 LAB — CBG MONITORING, ED
Comment 1: 0
Glucose-Capillary: >600 mg/dL (ref 70–99)
Glucose-Capillary: 518 mg/dL (ref 70–99)
Glucose-Capillary: >600 mg/dL (ref 70–99)
Glucose-Capillary: 456 mg/dL — ABNORMAL HIGH (ref 70–99)
Glucose-Capillary: 496 mg/dL — ABNORMAL HIGH (ref 70–99)

## 2023-06-02 LAB — GROUP A STREP BY PCR: Group A Strep by PCR: NOT DETECTED

## 2023-06-02 LAB — MONONUCLEOSIS SCREEN: Mono Screen: NEGATIVE

## 2023-06-02 LAB — LIPASE, BLOOD: Lipase: 39 U/L (ref 11–51)

## 2023-06-02 LAB — BETA-HYDROXYBUTYRIC ACID: Beta-Hydroxybutyric Acid: 5.87 mmol/L — ABNORMAL HIGH (ref 0.05–0.27)

## 2023-06-02 MED ORDER — DEXTROSE 50 % IV SOLN: 0.0000 mL | INTRAVENOUS | Status: DC | PRN

## 2023-06-02 MED ORDER — LACTATED RINGERS IV BOLUS
20.0000 mL/kg | Freq: Once | INTRAVENOUS | Status: AC
  Administered 2023-06-02: 3402 mL via INTRAVENOUS

## 2023-06-02 MED ORDER — LACTATED RINGERS IV BOLUS
1000.0000 mL | Freq: Once | INTRAVENOUS | Status: AC
  Administered 2023-06-02: 1000 mL via INTRAVENOUS

## 2023-06-02 MED ORDER — INSULIN REGULAR(HUMAN) IN NACL 100-0.9 UT/100ML-% IV SOLN
INTRAVENOUS | Status: DC
  Administered 2023-06-02: 13 [IU]/h via INTRAVENOUS
  Administered 2023-06-02 – 2023-06-03 (×2): 9 [IU]/h via INTRAVENOUS
  Filled 2023-06-02 (×2): qty 100

## 2023-06-02 MED ORDER — LACTATED RINGERS IV SOLN
INTRAVENOUS | Status: DC
  Administered 2023-06-02: 400 mL via INTRAVENOUS

## 2023-06-02 MED ORDER — DEXTROSE IN LACTATED RINGERS 5 % IV SOLN: INTRAVENOUS | Status: DC

## 2023-06-02 MED ORDER — IOHEXOL 300 MG/ML  SOLN
100.0000 mL | Freq: Once | INTRAMUSCULAR | Status: AC | PRN
  Administered 2023-06-02: 100 mL via INTRAVENOUS

## 2023-06-02 NOTE — Assessment & Plan Note (Signed)
Chronic stable continue Crestor 10 mg a day

## 2023-06-02 NOTE — Assessment & Plan Note (Signed)
will admit per  HHS protocol, obtain serial BMET, start on glucosestabalizer, aggressive IVF.   So far work up of possible causes of DKA/HSS with CXR, ECG one set of cardiac enzymes, UA. have been unremarkable   Monitor in Stepdown. Replace potassium as needed.   Consult diabetes coordinator

## 2023-06-02 NOTE — Assessment & Plan Note (Signed)
Chronic morbid obesity affecting medical care.  Will need to follow-up as an outpatient nutrition

## 2023-06-02 NOTE — Progress Notes (Signed)
Hospitalist Transfer Note:    Nursing staff, Please call [facility] Admits & Consults System-Wide number on Amion [phone]) as soon as patient's arrival, so appropriate admitting provider can evaluate the pt.   Transferring facility: [facility] Requesting provider: Achille Rich, PA (EDP at [facility]) Reason for transfer: admission for further evaluation and management of HHS versus hyperglycemia.    34 M w/ h/o DM2,  who presented to [facility] ED complaining of lower abdominal discomfort, polydipsia, nausea/vomiting.  He has a history of type 2 diabetes mellitus, but reports that he recently ran out of his outpatient diabetic medications, which include metformin.  he also notes a recent sore throat.   Labs were notable for CMP which showed glucose 754, bicarbonate 22, anion gap 19.  Beta-hydroxybutyrate acid was elevated.  VBGShowed 7.355/50.8.  Monospot ordered, Therazole currently pending.  Imaging notable for   CT abdomen/pelvis showed no evidence of acute intra-abdominal or acute intrapelvic process.  Started on insulin drip via Endo tool.   Subsequently, I accepted this patient for transfer for inpatient admission to a PCU/SDU bed at [facility] or [facility] (first available) for further work-up and management of the above.       Mark Pigg, DO Hospitalist

## 2023-06-02 NOTE — ED Triage Notes (Signed)
Sore throat, stomach ache, coughing, generalized pain all over,

## 2023-06-02 NOTE — H&P (Signed)
Mark Schneider MVH:[id number] DOB: 02-Nov-1980 DOA: 06/02/2023     PCP: Marrianne Mood, MD    Patient has went in the past to [facility] health internal medicine clinic but was transferred to [facility]  Patient arrived to ER on 06/02/23 at 1715 Referred by Attending Therisa Doyne, MD   Patient coming from:    home Lives  With family    Chief Complaint:   Chief Complaint  Patient presents with   URI   Abdominal Pain    HPI: Mark Schneider is a 42 y.o. male with medical history significant of DM2. HTN, obesity    Presented with abdominal pain sore throat polydipsia polyuria Hide patient presents with sore throat stomachache coughing and generalized pain malaise symptoms been ongoing for the past 3 days noted to have blood sugar 754 Reports associated abdominal pain polydipsia nausea vomiting Known history of DM2 has ran out of his outpatient diabetic medications    He work at day care  He has lsot his home meds he run out his meds Has some metformin left Has been still taking it   Denies significant ETOH intake   Does not smoke   Lab Results  Component Value Date   SARSCOV2NAA NEGATIVE 06/02/2023   SARSCOV2NAA NEGATIVE 08/21/2022   SARSCOV2NAA NEGATIVE 11/01/2020      Regarding pertinent Chronic problems:       HTN on Norvasc and losartan      DM 2 -  Lab Results  Component Value Date   HGBA1C 6.0 (A) 10/09/2022   on PO meds       Morbid obesity-   BMI Readings from Last 1 Encounters:  06/02/23 53.81 kg/m      CKD stage IIIb baseline Cr 1.4 Estimated Creatinine Clearance: 66.5 mL/min (A) (by C-G formula based on SCr of 2.31 mg/dL (H)).  Lab Results  Component Value Date   CREATININE 2.31 (H) 06/02/2023   CREATININE 2.46 (H) 06/02/2023   CREATININE 1.81 (H) 11/14/2022   Lab Results  Component Value Date   NA 133 (L) 06/02/2023   CL 94 (L) 06/02/2023   K 5.8 (H) 06/02/2023   CO2 22 06/02/2023   BUN 31 (H) 06/02/2023    CREATININE 2.31 (H) 06/02/2023   GFRNONAA 36 (L) 06/02/2023   CALCIUM 9.2 06/02/2023   ALBUMIN 4.1 06/02/2023   GLUCOSE 630 (HH) 06/02/2023    While in ER:   Noted to be in HHS CT abdomen unremarkable VBG no evidence of acidosis Chest x-ray unremarkable troponin unremarkable UA unremarkable No evidence of infectious process  started on insulin drip  Lab Orders         Resp panel by RT-PCR (RSV, Flu A&B, Covid) Anterior Nasal Swab         Group A Strep by PCR         CBC with Differential         Comprehensive metabolic panel         Lipase, blood         Urinalysis, Routine w reflex microscopic -Urine, Clean Catch         Beta-hydroxybutyric acid         Mononucleosis screen         Basic metabolic panel         Basic metabolic panel         Osmolality         I-Stat venous blood gas, ([facility], [facility], [facility])  CBG monitoring, ED         CBG monitoring, ED         CBG monitoring, ED         CBG monitoring, ED         CBG monitoring, ED         CBG monitoring, ED       CXR -  NON acute  CTabd/pelvis -  non-acute    Following Medications were ordered in ER: Medications  insulin regular, human (MYXREDLIN) 100 units/ 100 mL infusion (10 Units/hr Intravenous Rate/Dose Change 06/02/23 2233)  lactated ringers infusion (400 mLs Intravenous New Bag/Given 06/02/23 2344)  dextrose 5 % in lactated ringers infusion (has no administration in time range)  dextrose 50 % solution 0-50 mL (has no administration in time range)  lactated ringers bolus 1,000 mL (1,000 mLs Intravenous New Bag/Given 06/02/23 2018)  lactated ringers bolus 3,402 mL (3,402 mLs Intravenous New Bag/Given 06/02/23 2120)  iohexol (OMNIPAQUE) 300 MG/ML solution 100 mL (100 mLs Intravenous Contrast Given 06/02/23 2049)     ED Triage Vitals  Encounter Vitals Group     BP 06/02/23 1724 124/69     Systolic BP Percentile --      Diastolic BP Percentile --      Pulse Rate 06/02/23 1724 (!) 103     Resp 06/02/23  1724 16     Temp 06/02/23 1724 98 F (36.7 C)     Temp Source 06/02/23 2125 Oral     SpO2 06/02/23 1724 97 %     Weight 06/02/23 1723 (!) 375 lb (170.1 kg)     Height --      Head Circumference --      Peak Flow --      Pain Score 06/02/23 1723 7     Pain Loc --      Pain Education --      Exclude from Growth Chart --   EAVW(09)@     _________________________________________ Significant initial  Findings: Abnormal Labs Reviewed  COMPREHENSIVE METABOLIC PANEL - Abnormal; Notable for the following components:      Result Value   Sodium 130 (*)    Potassium 5.3 (*)    Chloride 89 (*)    Glucose, Bld 754 (*)    BUN 33 (*)    Creatinine, Ser 2.46 (*)    Total Protein 8.3 (*)    GFR, Estimated 33 (*)    Anion gap 19 (*)    All other components within normal limits  URINALYSIS, ROUTINE W REFLEX MICROSCOPIC - Abnormal; Notable for the following components:   Color, Urine COLORLESS (*)    Glucose, UA >1,000 (*)    Ketones, ur 15 (*)    All other components within normal limits  BETA-HYDROXYBUTYRIC ACID - Abnormal; Notable for the following components:   Beta-Hydroxybutyric Acid 5.87 (*)    All other components within normal limits  BASIC METABOLIC PANEL - Abnormal; Notable for the following components:   Sodium 133 (*)    Potassium 5.8 (*)    Chloride 94 (*)    Glucose, Bld 630 (*)    BUN 31 (*)    Creatinine, Ser 2.31 (*)    GFR, Estimated 36 (*)    Anion gap 17 (*)    All other components within normal limits  I-STAT VENOUS BLOOD GAS, ED - Abnormal; Notable for the following components:   pO2, Ven 25 (*)    Bicarbonate 28.5 (*)  Sodium 128 (*)    Potassium 5.8 (*)    All other components within normal limits  CBG MONITORING, ED - Abnormal; Notable for the following components:   Glucose-Capillary >600 (*)    All other components within normal limits  CBG MONITORING, ED - Abnormal; Notable for the following components:   Glucose-Capillary >600 (*)    All other  components within normal limits  CBG MONITORING, ED - Abnormal; Notable for the following components:   Glucose-Capillary 518 (*)    All other components within normal limits  CBG MONITORING, ED - Abnormal; Notable for the following components:   Glucose-Capillary 496 (*)    All other components within normal limits  CBG MONITORING, ED - Abnormal; Notable for the following components:   Glucose-Capillary 456 (*)    All other components within normal limits   ___________________ Troponin   Cardiac Panel (last 3 results) Recent Labs    06/02/23 1842 06/02/23 2018  TROPONINIHS 11 14     ECG: Ordered Personally reviewed and interpreted by me showing: HR : 96 Rhythm:  NSR,    no evidence of ischemic changes QTC 495   COVID-19 Labs  No results for input(s): "DDIMER", "FERRITIN", "LDH", "CRP" in the last 72 hours.  Lab Results  Component Value Date   SARSCOV2NAA NEGATIVE 06/02/2023   SARSCOV2NAA NEGATIVE 08/21/2022   SARSCOV2NAA NEGATIVE 11/01/2020    The recent clinical data is shown below. Vitals:   06/02/23 1749 06/02/23 2015 06/02/23 2125 06/02/23 2215  BP: 105/75  121/72 122/79  Pulse: 97 94 93   Resp: 17 (!) 26 (!) 24 (!) 24  Temp:   99.2 F (37.3 C)   TempSrc:   Oral   SpO2: 97% 96% 100%   Weight:        WBC     Component Value Date/Time   WBC 7.8 06/02/2023 1842   LYMPHSABS 1.0 06/02/2023 1842   LYMPHSABS 1.8 03/01/2020 1414   MONOABS 0.8 06/02/2023 1842   EOSABS 0.3 06/02/2023 1842   EOSABS 0.2 03/01/2020 1414   BASOSABS 0.1 06/02/2023 1842   BASOSABS 0.0 03/01/2020 1414     Lactic Acid, Venous       UA   no evidence of UTI     Urine analysis:    Component Value Date/Time   COLORURINE COLORLESS (A) 06/02/2023 1842   APPEARANCEUR CLEAR 06/02/2023 1842   LABSPEC 1.030 06/02/2023 1842   PHURINE 5.0 06/02/2023 1842   GLUCOSEU >1,000 (A) 06/02/2023 1842   HGBUR NEGATIVE 06/02/2023 1842   BILIRUBINUR NEGATIVE 06/02/2023 1842   KETONESUR 15 (A)  06/02/2023 1842   PROTEINUR NEGATIVE 06/02/2023 1842   NITRITE NEGATIVE 06/02/2023 1842   LEUKOCYTESUR NEGATIVE 06/02/2023 1842    Results for orders placed or performed during the hospital encounter of 06/02/23  Resp panel by RT-PCR (RSV, Flu A&B, Covid) Anterior Nasal Swab     Status: None   Collection Time: 06/02/23  5:24 PM   Specimen: Anterior Nasal Swab  Result Value Ref Range Status   SARS Coronavirus 2 by RT PCR NEGATIVE NEGATIVE Final         Influenza A by PCR NEGATIVE NEGATIVE Final   Influenza B by PCR NEGATIVE NEGATIVE Final         Resp Syncytial Virus by PCR NEGATIVE NEGATIVE Final        Group A Strep by PCR     Status: None   Collection Time: 06/02/23  5:24 PM   Specimen:  Anterior Nasal Swab; Sterile Swab  Result Value Ref Range Status   Group A Strep by PCR NOT DETECTED NOT DETECTED Final    Comment: Performed at [facility], [address] [place], [place] [address]     Venous  Blood Gas result:  pH   7.355 Calcium, Ion 1.16 mmol/L   pCO2, Ven 50.8 mmHg HCT 49.0 %  pO2, Ven 25 Low Panic  mmHg      __________________________________________________________ Recent Labs  Lab 06/02/23 1842 06/02/23 2018 06/02/23 2140  NA 130* 128* 133*  K 5.3* 5.8* 5.8*  CO2 22  --  22  GLUCOSE 754*  --  630*  BUN 33*  --  31*  CREATININE 2.46*  --  2.31*  CALCIUM 10.0  --  9.2    Cr   Up from baseline see below Lab Results  Component Value Date   CREATININE 2.31 (H) 06/02/2023   CREATININE 2.46 (H) 06/02/2023   CREATININE 1.81 (H) 11/14/2022    Recent Labs  Lab 06/02/23 1842  AST 18  ALT 16  ALKPHOS 92  BILITOT 0.6  PROT 8.3*  ALBUMIN 4.1   Lab Results  Component Value Date   CALCIUM 9.2 06/02/2023    Plt: Lab Results  Component Value Date   PLT 196 06/02/2023       Recent Labs  Lab 06/02/23 1842 06/02/23 2018  WBC 7.8  --   NEUTROABS 5.7  --   HGB 16.1 16.7  HCT 47.3 49.0  MCV 86.8  --   PLT 196  --     HG/HCT   stable,     Component Value Date/Time   HGB 16.7 06/02/2023 2018   HGB 15.2 03/01/2020 1414   HCT 49.0 06/02/2023 2018   HCT 45.1 03/01/2020 1414   MCV 86.8 06/02/2023 1842   MCV 89 03/01/2020 1414    Recent Labs  Lab 06/02/23 1842  LIPASE 39      _____________________ Hospitalist was called for admission for HHS   The following Work up has been ordered so far:  Orders Placed This Encounter  Procedures   Resp panel by RT-PCR (RSV, Flu A&B, Covid) Anterior Nasal Swab   Group A Strep by PCR   DG Chest 2 View   CT ABDOMEN PELVIS W CONTRAST   CBC with Differential   Comprehensive metabolic panel   Lipase, blood   Urinalysis, Routine w reflex microscopic -Urine, Clean Catch   Beta-hydroxybutyric acid   Mononucleosis screen   Basic metabolic panel   Basic metabolic panel   Osmolality   Diet NPO time specified   ED Cardiac monitoring   Initiate Carrier Fluid Protocol   Notify physician (specify)   If present, discontinue Insulin Pump after IV Insulin is initiated.   Do NOT use lab glucose values in [employer].  If CBG meter reads "Critical High", enter 600.   Upon IV fluid bolus completion, place order for STAT BMET (LAB15) and call provider with results.   K+ > 5 mEq/L and/or K+ addressed separately   Cardiac Monitoring Continuous x 24 hours Indications for use: Other; other indications for use: Monitor for acute ischemic changes   Consult to hospitalist   Airborne and Contact precautions   I-Stat venous blood gas, ([facility], [facility], [facility])   CBG monitoring, ED   CBG monitoring, ED   CBG monitoring, ED   CBG monitoring, ED   CBG monitoring, ED   CBG monitoring, ED   ED EKG  Insert peripheral IV   Admit to Inpatient (patient's expected length of stay will be greater than 2 midnights or inpatient only procedure)     OTHER Significant initial  Findings:  labs showing:  DM  labs:  HbA1C: Recent Labs    07/31/22 1639 10/09/22 1455  HGBA1C 6.3* 6.0*    CBG (last 3)   Recent Labs    06/02/23 2202 06/02/23 2231 06/02/23 2251  GLUCAP 518* 496* 456*    Cultures: No results found for: "SDES", "SPECREQUEST", "CULT", "REPTSTATUS"   Radiological Exams on Admission: CT ABDOMEN PELVIS W CONTRAST  Result Date: 06/02/2023 CLINICAL DATA:  Sore throat, cough, generalized pain and stomach ache. EXAM: CT ABDOMEN AND PELVIS WITH CONTRAST TECHNIQUE: Multidetector CT imaging of the abdomen and pelvis was performed using the standard protocol following bolus administration of intravenous contrast. RADIATION DOSE REDUCTION: This exam was performed according to the departmental dose-optimization program which includes automated exposure control, adjustment of the mA and/or kV according to patient size and/or use of iterative reconstruction technique. CONTRAST:  OMNIPAQUE IOHEXOL 300 MG/ML  SOLN COMPARISON:  Nov 14, 2022 FINDINGS: Lower chest: No acute abnormality. Hepatobiliary: No focal liver abnormality is seen. No gallstones, gallbladder wall thickening, or biliary dilatation. Pancreas: Unremarkable. No pancreatic ductal dilatation or surrounding inflammatory changes. Spleen: Normal in size without focal abnormality. Adrenals/Urinary Tract: Adrenal glands are unremarkable. Kidneys are normal, without renal calculi, focal lesion, or hydronephrosis. Bladder is unremarkable. Stomach/Bowel: Stomach is within normal limits. Appendix appears normal. No evidence of bowel wall thickening, distention, or inflammatory changes. Small, noninflamed diverticula are seen scattered throughout the large bowel. Vascular/Lymphatic: No significant vascular findings are present. No enlarged abdominal or pelvic lymph nodes. Reproductive: Prostate is unremarkable. Other: No abdominal wall hernia or abnormality. No abdominopelvic ascites. Musculoskeletal: No acute or significant osseous findings. IMPRESSION: 1. No acute or active process within the abdomen or pelvis. 2. Colonic diverticulosis.  Electronically Signed   By: Aram Candela M.D.   On: 06/02/2023 21:09   DG Chest 2 View  Result Date: 06/02/2023 CLINICAL DATA:  cough.  Loss of appetite. EXAM: CHEST - 2 VIEW COMPARISON:  Chest x-ray 11/01/2020 FINDINGS: The heart and mediastinal contours are within normal limits. No focal consolidation. No pulmonary edema. No pleural effusion. No pneumothorax. No acute osseous abnormality. IMPRESSION: No active cardiopulmonary disease. Electronically Signed   By: Tish Frederickson M.D.   On: 06/02/2023 18:16   _______________________________________________________________________________________________________ Latest  Blood pressure 122/79, pulse 93, temperature 99.2 F (37.3 C), temperature source Oral, resp. rate (!) 24, weight (!) 170.1 kg, SpO2 100%.   Vitals  labs and radiology finding personally reviewed  Review of Systems:    Pertinent positives include:   abdominal pain, nausea, polydipsia polyuria  Constitutional:  No weight loss, night sweats, Fevers, chills, fatigue, weight loss  HEENT:  No headaches, Difficulty swallowing,Tooth/dental problems,Sore throat,  No sneezing, itching, ear ache, nasal congestion, post nasal drip,  Cardio-vascular:  No chest pain, Orthopnea, PND, anasarca, dizziness, palpitations.no Bilateral lower extremity swelling  GI:  No heartburn, indigestion, vomiting, diarrhea, change in bowel habits, loss of appetite, melena, blood in stool, hematemesis Resp:  no shortness of breath at rest. No dyspnea on exertion, No excess mucus, no productive cough, No non-productive cough, No coughing up of blood.No change in color of mucus.No wheezing. Skin:  no rash or lesions. No jaundice GU:  no dysuria, change in color of urine, no urgency or frequency. No straining to urinate.  No flank pain.  Musculoskeletal:  No joint pain or no joint swelling. No decreased range of motion. No back pain.  Psych:  No change in mood or affect. No depression or anxiety.  No memory loss.  Neuro: no localizing neurological complaints, no tingling, no weakness, no double vision, no gait abnormality, no slurred speech, no confusion  All systems reviewed and apart from HOPI all are negative _______________________________________________________________________________________________ Past Medical History:   Past Medical History:  Diagnosis Date   Acute renal failure (ARF) (HCC) 11/15/2017   DM (diabetes mellitus) (HCC)    Hypertension     Past Surgical History:  Procedure Laterality Date   DENTAL SURGERY     TOE SURGERY      Social History:  Ambulatory   independently       reports that he has never smoked. He has never used smokeless tobacco. He reports that he does not drink alcohol and does not use drugs.   Family History:   Family History  Problem Relation Age of Onset   Diabetes Mother    Diabetes Sister    Diabetes Maternal Grandmother    Diabetes Maternal Uncle    Diabetes Maternal Grandfather    ______________________________________________________________________________________________ Allergies: Allergies  Allergen Reactions   Sheep-Derived Products Swelling   Shellfish Allergy Swelling     Prior to Admission medications   Medication Sig Start Date End Date Taking? Authorizing Provider  Accu-Chek Softclix Lancets lancets Use as instructed Patient not taking: Reported on 11/14/2022 10/09/22   Marrianne Mood, MD  amLODipine (NORVASC) 10 MG tablet Take 1 tablet (10 mg total) by mouth daily. 10/09/22   Marrianne Mood, MD  Blood Glucose Monitoring Suppl (ACCU-CHEK GUIDE ME) w/Device KIT Use in the morning to check fasting blood glucose. Patient not taking: Reported on 11/14/2022 10/09/22   Marrianne Mood, MD  doxycycline (VIBRAMYCIN) 100 MG capsule Take 1 capsule (100 mg total) by mouth 2 (two) times daily. 05/20/23   Lorre Nick, MD  empagliflozin (JARDIANCE) 10 MG TABS tablet Take 1 tablet (10 mg total) by mouth daily  before breakfast. 10/09/22   Marrianne Mood, MD  glipiZIDE (GLUCOTROL) 5 MG tablet Take 1 tablet (5 mg total) by mouth 2 (two) times daily before a meal. Patient not taking: Reported on 11/14/2022 10/09/22 01/07/23  Marrianne Mood, MD  glucose blood (ACCU-CHEK GUIDE) test strip Use to check blood sugar once daily before the first meal of the day. Patient not taking: Reported on 11/14/2022 10/10/22   Marrianne Mood, MD  losartan (COZAAR) 100 MG tablet TAKE 1 TABLET BY MOUTH EVERY DAY Patient not taking: Reported on 11/14/2022 10/25/22   Marrianne Mood, MD  metFORMIN (GLUCOPHAGE) 1000 MG tablet Take 1 tablet (1,000 mg total) by mouth 2 (two) times daily with a meal. 04/12/22   de [place], Buren Kos, MD  ondansetron (ZOFRAN-ODT) 4 MG disintegrating tablet Take 1 tablet (4 mg total) by mouth every 8 (eight) hours as needed for nausea or vomiting. 11/14/22   Pricilla Loveless, MD  rosuvastatin (CRESTOR) 10 MG tablet Take 1 tablet (10 mg total) by mouth daily. 04/12/22   de [place], Raymond J, MD  Semaglutide,0.25 or 0.5MG /DOS, 2 MG/3ML SOPN Inject 0.25 mg into the skin once a week. 10/09/22   Marrianne Mood, MD    ___________________________________________________________________________________________________ Physical Exam:    06/02/2023   10:15 PM 06/02/2023    9:25 PM 06/02/2023    8:15 PM  Vitals with BMI  Systolic 122 121   Diastolic 79 72   Pulse  93  94     1. General:  in No  Acute distress    Chronically ill   -appearing 2. Psychological: Alert and   Oriented 3. Head/ENT:    Dry Mucous Membranes                          Head Non traumatic, neck supple                          Poor Dentition 4. SKIN: decreased Skin turgor,  Skin clean Dry and intact no rash    5. Heart: Regular rate and rhythm no  Murmur, no Rub or gallop 6. Lungs: distant no wheezes or crackles   7. Abdomen: Soft,  non-tender, Non distended   obese  bowel sounds present 8. Lower extremities: no clubbing, cyanosis,  no  edema 9. Neurologically Grossly intact, moving all 4 extremities equally   10. MSK: Normal range of motion    Chart has been reviewed  ______________________________________________________________________________________________  Assessment/Plan 42 y.o. male with medical history significant of DM2. HTN, obesity  Admitted for HHS   Present on Admission:  Hyperosmolar hyperglycemic state (HHS) (HCC)  Hyperlipidemia  AKI (acute kidney injury) (HCC)    Body mass index (BMI) of 50-59.9 in adult [facility]) Chronic morbid obesity affecting medical care.  Will need to follow-up as an outpatient nutrition  Hyperlipidemia Chronic stable continue Crestor 10 mg a day  Hyperosmolar hyperglycemic state (HHS) [facility]) will admit per  HHS protocol, obtain serial BMET, start on glucosestabalizer, aggressive IVF.   So far work up of possible causes of DKA/HSS with CXR, ECG one set of cardiac enzymes, UA. have been unremarkable   Monitor in Stepdown. Replace potassium as needed.   Consult diabetes coordinator    AKI (acute kidney injury) (HCC) In the setting of dehydration from hyper glycemia.  Will control blood sugars monitor renal function rehydrate aggressively   Other plan as per orders.  DVT prophylaxis:  SCD   Code Status:    Code Status: Prior FULL CODE   as per patient   I had personally discussed CODE STATUS with patient and family  ACP   none   Family Communication:   Family not at  Bedside    Diet  Diet Orders (From admission, onward)     Start     Ordered   06/02/23 2032  Diet NPO time specified  Diet effective now        06/02/23 2033            Disposition Plan:        To home once workup is complete and patient is stable   Following barriers for discharge:                                                          Electrolytes corrected                              Hyperglycemia resolves       Consult Orders  (From admission, onward)           Start      Ordered   06/02/23 2034  Consult to hospitalist  Called carelink @[phone]   Once       Provider:  (Not yet assigned)  Question Answer Comment  Place call to: [facility]   Reason for Consult Admit      06/02/23 2033                              Diabetes care coordinator                     Consults called: none     Admission status:  ED Disposition     ED Disposition  Admit   Condition  --   Comment  Hospital Area: [facility] [[id number]]  Level of Care: Stepdown [14]  Admit to SDU based on following criteria: Severe physiological/psychological symptoms:  Any diagnosis requiring assessment & intervention at least every 4 hours on an ongoing basis to obtain desired patient outcomes including stability and rehabilitation  May admit [facility] to [facility] or [facility] if equivalent level of care is available:: Yes  Interfacility transfer: Yes  Covid Evaluation: Asymptomatic - no recent exposure (last 10 days) testing not required  Diagnosis: Hyperosmolar hyperglycemic state (HHS) [facility]) [[id number]]  Admitting Physician: Angie Fava [[id number]]  Attending Physician: Angie Fava [[id number]]  Certification:: I certify this patient will need inpatient services for at least 2 midnights  Expected Medical Readiness: 06/04/2023              inpatient     I Expect 2 midnight stay secondary to severity of patient's current illness need for inpatient interventions justified by the following:   Severe lab/radiological/exam abnormalities including:    HHS and extensive comorbidities including:  DM2    Morbid Obesity  That are currently affecting medical management.   I expect  patient to be hospitalized for 2 midnights requiring inpatient medical care.  Patient is at high risk for adverse outcome (such as loss of life or disability) if not treated.  Indication for inpatient stay as follows:   inability to maintain oral hydration      Need for  IV insulin and frequent labs   Level of care       stepdown   tele indefinitely please discontinue once patient no longer qualifies COVID-19 Labs    Lab Results  Component Value Date   SARSCOV2NAA NEGATIVE 06/02/2023     Dalaysia Harms 06/03/2023, 12:54 AM    [facility]     after 2 AM please page floor coverage PA If 7AM-7PM, please contact the day team taking care of the patient using [email or web address]

## 2023-06-02 NOTE — ED Notes (Signed)
Blood sugar 754. Victory Dakin, [place] aware

## 2023-06-02 NOTE — ED Notes (Signed)
RT ran VBG with the following results w/out criticals.  Latest Reference Range & Units Most Recent  Sample type  VENOUS 06/02/23 20:18  pH, Ven 7.25 - 7.43  7.355 06/02/23 20:18  pCO2, Ven 44 - 60 mmHg 50.8 06/02/23 20:18  pO2, Ven 32 - 45 mmHg 25 (LL) 06/02/23 20:18  TCO2 22 - 32 mmol/L 30 06/02/23 20:18  Acid-Base Excess 0.0 - 2.0 mmol/L 2.0 06/02/23 20:18  Bicarbonate 20.0 - 28.0 mmol/L 28.5 (H) 06/02/23 20:18  O2 Saturation % 43 06/02/23 20:18  Patient temperature  98.0 F 06/02/23 20:18  Collection site  IV start 06/02/23 20:18  (LL): Data is critically low (H): Data is abnormally high

## 2023-06-02 NOTE — Subjective & Objective (Signed)
Hide patient presents with sore throat stomachache coughing and generalized pain malaise symptoms been ongoing for the past 3 days noted to have blood sugar 754 Reports associated abdominal pain polydipsia nausea vomiting Known history of DM2 has ran out of his outpatient diabetic medications

## 2023-06-02 NOTE — ED Triage Notes (Signed)
Since wednesday

## 2023-06-02 NOTE — ED Notes (Signed)
Patient transported to CT 

## 2023-06-02 NOTE — H&P (Incomplete)
Mark Schneider ZOX:[id number] DOB: October 28, 1980 DOA: 06/02/2023     PCP: Marrianne Mood, MD    Patient has went in the past to [facility] health internal medicine clinic but was transferred to [facility]  Patient arrived to ER on 06/02/23 at 1715 Referred by Attending Therisa Doyne, MD   Patient coming from:    home Lives alone,   *** With family    Chief Complaint:   Chief Complaint  Patient presents with  . URI  . Abdominal Pain    HPI: Mark Schneider is a 42 y.o. male with medical history significant of DM2. HTN, obesity    Presented with abdominal pain sore throat polydipsia polyuria Hide patient presents with sore throat stomachache coughing and generalized pain malaise symptoms been ongoing for the past 3 days noted to have blood sugar 754 Reports associated abdominal pain polydipsia nausea vomiting Known history of DM2 has ran out of his outpatient diabetic medications    Denies significant ETOH intake *** Does not smoke*** but interested in quitting***  Lab Results  Component Value Date   SARSCOV2NAA NEGATIVE 06/02/2023   SARSCOV2NAA NEGATIVE 08/21/2022   SARSCOV2NAA NEGATIVE 11/01/2020        Regarding pertinent Chronic problems:       HTN on Norvasc and losartan      DM 2 -  Lab Results  Component Value Date   HGBA1C 6.0 (A) 10/09/2022   on PO meds       Morbid obesity-   BMI Readings from Last 1 Encounters:  06/02/23 53.81 kg/m     *** Asthma -well *** controlled on home inhalers/ nebs                     *** COPD - not **followed by pulmonology *** not  on baseline oxygen  *L,    *** OSA -on nocturnal oxygen, *CPAP, *noncompliant with CPAP    CKD stage IIIb baseline Cr 1.4 Estimated Creatinine Clearance: 66.5 mL/min (A) (by C-G formula based on SCr of 2.31 mg/dL (H)).  Lab Results  Component Value Date   CREATININE 2.31 (H) 06/02/2023   CREATININE 2.46 (H) 06/02/2023   CREATININE 1.81 (H) 11/14/2022   Lab  Results  Component Value Date   NA 133 (L) 06/02/2023   CL 94 (L) 06/02/2023   K 5.8 (H) 06/02/2023   CO2 22 06/02/2023   BUN 31 (H) 06/02/2023   CREATININE 2.31 (H) 06/02/2023   GFRNONAA 36 (L) 06/02/2023   CALCIUM 9.2 06/02/2023   ALBUMIN 4.1 06/02/2023   GLUCOSE 630 (HH) 06/02/2023    While in ER:   Noted to be in HHS CT abdomen unremarkable VBG no evidence of acidosis Chest x-ray unremarkable troponin unremarkable UA unremarkable No evidence of infectious process  started on insulin drip  Lab Orders         Resp panel by RT-PCR (RSV, Flu A&B, Covid) Anterior Nasal Swab         Group A Strep by PCR         CBC with Differential         Comprehensive metabolic panel         Lipase, blood         Urinalysis, Routine w reflex microscopic -Urine, Clean Catch         Beta-hydroxybutyric acid         Mononucleosis screen         Basic metabolic panel  Basic metabolic panel         Osmolality         I-Stat venous blood gas, ([facility], [facility], [facility])         CBG monitoring, ED         CBG monitoring, ED         CBG monitoring, ED         CBG monitoring, ED         CBG monitoring, ED         CBG monitoring, ED       CXR -  NON acute  CTabd/pelvis -  non-acute    Following Medications were ordered in ER: Medications  insulin regular, human (MYXREDLIN) 100 units/ 100 mL infusion (10 Units/hr Intravenous Rate/Dose Change 06/02/23 2233)  lactated ringers infusion (400 mLs Intravenous New Bag/Given 06/02/23 2344)  dextrose 5 % in lactated ringers infusion (has no administration in time range)  dextrose 50 % solution 0-50 mL (has no administration in time range)  lactated ringers bolus 1,000 mL (1,000 mLs Intravenous New Bag/Given 06/02/23 2018)  lactated ringers bolus 3,402 mL (3,402 mLs Intravenous New Bag/Given 06/02/23 2120)  iohexol (OMNIPAQUE) 300 MG/ML solution 100 mL (100 mLs Intravenous Contrast Given 06/02/23 2049)     ED Triage Vitals  Encounter Vitals Group      BP 06/02/23 1724 124/69     Systolic BP Percentile --      Diastolic BP Percentile --      Pulse Rate 06/02/23 1724 (!) 103     Resp 06/02/23 1724 16     Temp 06/02/23 1724 98 F (36.7 C)     Temp Source 06/02/23 2125 Oral     SpO2 06/02/23 1724 97 %     Weight 06/02/23 1723 (!) 375 lb (170.1 kg)     Height --      Head Circumference --      Peak Flow --      Pain Score 06/02/23 1723 7     Pain Loc --      Pain Education --      Exclude from Growth Chart --   XBJY(78)@     _________________________________________ Significant initial  Findings: Abnormal Labs Reviewed  COMPREHENSIVE METABOLIC PANEL - Abnormal; Notable for the following components:      Result Value   Sodium 130 (*)    Potassium 5.3 (*)    Chloride 89 (*)    Glucose, Bld 754 (*)    BUN 33 (*)    Creatinine, Ser 2.46 (*)    Total Protein 8.3 (*)    GFR, Estimated 33 (*)    Anion gap 19 (*)    All other components within normal limits  URINALYSIS, ROUTINE W REFLEX MICROSCOPIC - Abnormal; Notable for the following components:   Color, Urine COLORLESS (*)    Glucose, UA >1,000 (*)    Ketones, ur 15 (*)    All other components within normal limits  BETA-HYDROXYBUTYRIC ACID - Abnormal; Notable for the following components:   Beta-Hydroxybutyric Acid 5.87 (*)    All other components within normal limits  BASIC METABOLIC PANEL - Abnormal; Notable for the following components:   Sodium 133 (*)    Potassium 5.8 (*)    Chloride 94 (*)    Glucose, Bld 630 (*)    BUN 31 (*)    Creatinine, Ser 2.31 (*)    GFR, Estimated 36 (*)    Anion gap 17 (*)  All other components within normal limits  I-STAT VENOUS BLOOD GAS, ED - Abnormal; Notable for the following components:   pO2, Ven 25 (*)    Bicarbonate 28.5 (*)    Sodium 128 (*)    Potassium 5.8 (*)    All other components within normal limits  CBG MONITORING, ED - Abnormal; Notable for the following components:   Glucose-Capillary >600 (*)    All other  components within normal limits  CBG MONITORING, ED - Abnormal; Notable for the following components:   Glucose-Capillary >600 (*)    All other components within normal limits  CBG MONITORING, ED - Abnormal; Notable for the following components:   Glucose-Capillary 518 (*)    All other components within normal limits  CBG MONITORING, ED - Abnormal; Notable for the following components:   Glucose-Capillary 496 (*)    All other components within normal limits  CBG MONITORING, ED - Abnormal; Notable for the following components:   Glucose-Capillary 456 (*)    All other components within normal limits      _________________________ Troponin   Cardiac Panel (last 3 results) Recent Labs    06/02/23 1842 06/02/23 2018  TROPONINIHS 11 14     ECG: Ordered Personally reviewed and interpreted by me showing: HR : *** Rhythm: *NSR, Sinus tachycardia * A.fib. W RVR, RBBB, LBBB, Paced Ischemic changes*nonspecific changes, no evidence of ischemic changes QTC*   COVID-19 Labs  No results for input(s): "DDIMER", "FERRITIN", "LDH", "CRP" in the last 72 hours.  Lab Results  Component Value Date   SARSCOV2NAA NEGATIVE 06/02/2023   SARSCOV2NAA NEGATIVE 08/21/2022   SARSCOV2NAA NEGATIVE 11/01/2020    The recent clinical data is shown below. Vitals:   06/02/23 1749 06/02/23 2015 06/02/23 2125 06/02/23 2215  BP: 105/75  121/72 122/79  Pulse: 97 94 93   Resp: 17 (!) 26 (!) 24 (!) 24  Temp:   99.2 F (37.3 C)   TempSrc:   Oral   SpO2: 97% 96% 100%   Weight:        WBC     Component Value Date/Time   WBC 7.8 06/02/2023 1842   LYMPHSABS 1.0 06/02/2023 1842   LYMPHSABS 1.8 03/01/2020 1414   MONOABS 0.8 06/02/2023 1842   EOSABS 0.3 06/02/2023 1842   EOSABS 0.2 03/01/2020 1414   BASOSABS 0.1 06/02/2023 1842   BASOSABS 0.0 03/01/2020 1414     Lactic Acid, Venous       UA   no evidence of UTI     Urine analysis:    Component Value Date/Time   COLORURINE COLORLESS (A)  06/02/2023 1842   APPEARANCEUR CLEAR 06/02/2023 1842   LABSPEC 1.030 06/02/2023 1842   PHURINE 5.0 06/02/2023 1842   GLUCOSEU >1,000 (A) 06/02/2023 1842   HGBUR NEGATIVE 06/02/2023 1842   BILIRUBINUR NEGATIVE 06/02/2023 1842   KETONESUR 15 (A) 06/02/2023 1842   PROTEINUR NEGATIVE 06/02/2023 1842   NITRITE NEGATIVE 06/02/2023 1842   LEUKOCYTESUR NEGATIVE 06/02/2023 1842    Results for orders placed or performed during the hospital encounter of 06/02/23  Resp panel by RT-PCR (RSV, Flu A&B, Covid) Anterior Nasal Swab     Status: None   Collection Time: 06/02/23  5:24 PM   Specimen: Anterior Nasal Swab  Result Value Ref Range Status   SARS Coronavirus 2 by RT PCR NEGATIVE NEGATIVE Final         Influenza A by PCR NEGATIVE NEGATIVE Final   Influenza B by PCR NEGATIVE NEGATIVE Final  Resp Syncytial Virus by PCR NEGATIVE NEGATIVE Final        Group A Strep by PCR     Status: None   Collection Time: 06/02/23  5:24 PM   Specimen: Anterior Nasal Swab; Sterile Swab  Result Value Ref Range Status   Group A Strep by PCR NOT DETECTED NOT DETECTED Final    Comment: Performed at [facility], [address] [place], [place] [address]     Venous  Blood Gas result:  pH   7.355 Calcium, Ion 1.16 mmol/L   pCO2, Ven 50.8 mmHg HCT 49.0 %  pO2, Ven 25 Low Panic  mmHg      __________________________________________________________ Recent Labs  Lab 06/02/23 1842 06/02/23 2018 06/02/23 2140  NA 130* 128* 133*  K 5.3* 5.8* 5.8*  CO2 22  --  22  GLUCOSE 754*  --  630*  BUN 33*  --  31*  CREATININE 2.46*  --  2.31*  CALCIUM 10.0  --  9.2    Cr   Up from baseline see below Lab Results  Component Value Date   CREATININE 2.31 (H) 06/02/2023   CREATININE 2.46 (H) 06/02/2023   CREATININE 1.81 (H) 11/14/2022    Recent Labs  Lab 06/02/23 1842  AST 18  ALT 16  ALKPHOS 92  BILITOT 0.6  PROT 8.3*  ALBUMIN 4.1   Lab Results  Component Value Date   CALCIUM  9.2 06/02/2023    Plt: Lab Results  Component Value Date   PLT 196 06/02/2023       Recent Labs  Lab 06/02/23 1842 06/02/23 2018  WBC 7.8  --   NEUTROABS 5.7  --   HGB 16.1 16.7  HCT 47.3 49.0  MCV 86.8  --   PLT 196  --     HG/HCT * stable,  Down *Up from baseline see below    Component Value Date/Time   HGB 16.7 06/02/2023 2018   HGB 15.2 03/01/2020 1414   HCT 49.0 06/02/2023 2018   HCT 45.1 03/01/2020 1414   MCV 86.8 06/02/2023 1842   MCV 89 03/01/2020 1414      Recent Labs  Lab 06/02/23 1842  LIPASE 39   No results for input(s): "AMMONIA" in the last 168 hours.    .lab  _______________________________________________ Hospitalist was called for admission for *** There are no diagnoses linked to this encounter.   The following Work up has been ordered so far:  Orders Placed This Encounter  Procedures  . Resp panel by RT-PCR (RSV, Flu A&B, Covid) Anterior Nasal Swab  . Group A Strep by PCR  . DG Chest 2 View  . CT ABDOMEN PELVIS W CONTRAST  . CBC with Differential  . Comprehensive metabolic panel  . Lipase, blood  . Urinalysis, Routine w reflex microscopic -Urine, Clean Catch  . Beta-hydroxybutyric acid  . Mononucleosis screen  . Basic metabolic panel  . Basic metabolic panel  . Osmolality  . Diet NPO time specified  . ED Cardiac monitoring  . Initiate Carrier Fluid Protocol  . Notify physician (specify)  . If present, discontinue Insulin Pump after IV Insulin is initiated.  . Do NOT use lab glucose values in End[employer].  If CBG meter reads "Critical High", enter 600.  Marland Kitchen Upon IV fluid bolus completion, place order for STAT BMET (LAB15) and call provider with results.  . K+ > 5 mEq/L and/or K+ addressed separately  . Cardiac Monitoring Continuous x 24 hours Indications for use: Other;  other indications for use: Monitor for acute ischemic changes  . Consult to hospitalist  . Airborne and Contact precautions  . I-Stat venous blood gas, ([facility],  [facility], [facility])  . CBG monitoring, ED  . CBG monitoring, ED  . CBG monitoring, ED  . CBG monitoring, ED  . CBG monitoring, ED  . CBG monitoring, ED  . ED EKG  . Insert peripheral IV  . Admit to Inpatient (patient's expected length of stay will be greater than 2 midnights or inpatient only procedure)     OTHER Significant initial  Findings:  labs showing:     DM  labs:  HbA1C: Recent Labs    07/31/22 1639 10/09/22 1455  HGBA1C 6.3* 6.0*       CBG (last 3)  Recent Labs    06/02/23 2202 06/02/23 2231 06/02/23 2251  GLUCAP 518* 496* 456*          Cultures: No results found for: "SDES", "SPECREQUEST", "CULT", "REPTSTATUS"   Radiological Exams on Admission: CT ABDOMEN PELVIS W CONTRAST  Result Date: 06/02/2023 CLINICAL DATA:  Sore throat, cough, generalized pain and stomach ache. EXAM: CT ABDOMEN AND PELVIS WITH CONTRAST TECHNIQUE: Multidetector CT imaging of the abdomen and pelvis was performed using the standard protocol following bolus administration of intravenous contrast. RADIATION DOSE REDUCTION: This exam was performed according to the departmental dose-optimization program which includes automated exposure control, adjustment of the mA and/or kV according to patient size and/or use of iterative reconstruction technique. CONTRAST:  OMNIPAQUE IOHEXOL 300 MG/ML  SOLN COMPARISON:  Nov 14, 2022 FINDINGS: Lower chest: No acute abnormality. Hepatobiliary: No focal liver abnormality is seen. No gallstones, gallbladder wall thickening, or biliary dilatation. Pancreas: Unremarkable. No pancreatic ductal dilatation or surrounding inflammatory changes. Spleen: Normal in size without focal abnormality. Adrenals/Urinary Tract: Adrenal glands are unremarkable. Kidneys are normal, without renal calculi, focal lesion, or hydronephrosis. Bladder is unremarkable. Stomach/Bowel: Stomach is within normal limits. Appendix appears normal. No evidence of bowel wall thickening, distention, or  inflammatory changes. Small, noninflamed diverticula are seen scattered throughout the large bowel. Vascular/Lymphatic: No significant vascular findings are present. No enlarged abdominal or pelvic lymph nodes. Reproductive: Prostate is unremarkable. Other: No abdominal wall hernia or abnormality. No abdominopelvic ascites. Musculoskeletal: No acute or significant osseous findings. IMPRESSION: 1. No acute or active process within the abdomen or pelvis. 2. Colonic diverticulosis. Electronically Signed   By: Aram Candela M.D.   On: 06/02/2023 21:09   DG Chest 2 View  Result Date: 06/02/2023 CLINICAL DATA:  cough.  Loss of appetite. EXAM: CHEST - 2 VIEW COMPARISON:  Chest x-ray 11/01/2020 FINDINGS: The heart and mediastinal contours are within normal limits. No focal consolidation. No pulmonary edema. No pleural effusion. No pneumothorax. No acute osseous abnormality. IMPRESSION: No active cardiopulmonary disease. Electronically Signed   By: Tish Frederickson M.D.   On: 06/02/2023 18:16   _______________________________________________________________________________________________________ Latest  Blood pressure 122/79, pulse 93, temperature 99.2 F (37.3 C), temperature source Oral, resp. rate (!) 24, weight (!) 170.1 kg, SpO2 100%.   Vitals  labs and radiology finding personally reviewed  Review of Systems:    Pertinent positives include: ***  Constitutional:  No weight loss, night sweats, Fevers, chills, fatigue, weight loss  HEENT:  No headaches, Difficulty swallowing,Tooth/dental problems,Sore throat,  No sneezing, itching, ear ache, nasal congestion, post nasal drip,  Cardio-vascular:  No chest pain, Orthopnea, PND, anasarca, dizziness, palpitations.no Bilateral lower extremity swelling  GI:  No heartburn, indigestion, abdominal pain, nausea, vomiting,  diarrhea, change in bowel habits, loss of appetite, melena, blood in stool, hematemesis Resp:  no shortness of breath at rest. No  dyspnea on exertion, No excess mucus, no productive cough, No non-productive cough, No coughing up of blood.No change in color of mucus.No wheezing. Skin:  no rash or lesions. No jaundice GU:  no dysuria, change in color of urine, no urgency or frequency. No straining to urinate.  No flank pain.  Musculoskeletal:  No joint pain or no joint swelling. No decreased range of motion. No back pain.  Psych:  No change in mood or affect. No depression or anxiety. No memory loss.  Neuro: no localizing neurological complaints, no tingling, no weakness, no double vision, no gait abnormality, no slurred speech, no confusion  All systems reviewed and apart from HOPI all are negative _______________________________________________________________________________________________ Past Medical History:   Past Medical History:  Diagnosis Date  . Acute renal failure (ARF) (HCC) 11/15/2017  . DM (diabetes mellitus) (HCC)   . Hypertension       Past Surgical History:  Procedure Laterality Date  . DENTAL SURGERY    . TOE SURGERY      Social History:  Ambulatory *** independently cane, walker  wheelchair bound, bed bound     reports that he has never smoked. He has never used smokeless tobacco. He reports that he does not drink alcohol and does not use drugs.     Family History: *** Family History  Problem Relation Age of Onset  . Diabetes Mother   . Diabetes Sister   . Diabetes Maternal Grandmother   . Diabetes Maternal Uncle   . Diabetes Maternal Grandfather    ______________________________________________________________________________________________ Allergies: Allergies  Allergen Reactions  . Sheep-Derived Products Swelling  . Shellfish Allergy Swelling     Prior to Admission medications   Medication Sig Start Date End Date Taking? Authorizing Provider  Accu-Chek Softclix Lancets lancets Use as instructed Patient not taking: Reported on 11/14/2022 10/09/22   Marrianne Mood, MD  amLODipine (NORVASC) 10 MG tablet Take 1 tablet (10 mg total) by mouth daily. 10/09/22   Marrianne Mood, MD  Blood Glucose Monitoring Suppl (ACCU-CHEK GUIDE ME) w/Device KIT Use in the morning to check fasting blood glucose. Patient not taking: Reported on 11/14/2022 10/09/22   Marrianne Mood, MD  doxycycline (VIBRAMYCIN) 100 MG capsule Take 1 capsule (100 mg total) by mouth 2 (two) times daily. 05/20/23   Lorre Nick, MD  empagliflozin (JARDIANCE) 10 MG TABS tablet Take 1 tablet (10 mg total) by mouth daily before breakfast. 10/09/22   Marrianne Mood, MD  glipiZIDE (GLUCOTROL) 5 MG tablet Take 1 tablet (5 mg total) by mouth 2 (two) times daily before a meal. Patient not taking: Reported on 11/14/2022 10/09/22 01/07/23  Marrianne Mood, MD  glucose blood (ACCU-CHEK GUIDE) test strip Use to check blood sugar once daily before the first meal of the day. Patient not taking: Reported on 11/14/2022 10/10/22   Marrianne Mood, MD  losartan (COZAAR) 100 MG tablet TAKE 1 TABLET BY MOUTH EVERY DAY Patient not taking: Reported on 11/14/2022 10/25/22   Marrianne Mood, MD  metFORMIN (GLUCOPHAGE) 1000 MG tablet Take 1 tablet (1,000 mg total) by mouth 2 (two) times daily with a meal. 04/12/22   de [place], Buren Kos, MD  ondansetron (ZOFRAN-ODT) 4 MG disintegrating tablet Take 1 tablet (4 mg total) by mouth every 8 (eight) hours as needed for nausea or vomiting. 11/14/22   Pricilla Loveless, MD  rosuvastatin (CRESTOR) 10 MG tablet Take  1 tablet (10 mg total) by mouth daily. 04/12/22   de [place], Raymond J, MD  Semaglutide,0.25 or 0.5MG /DOS, 2 MG/3ML SOPN Inject 0.25 mg into the skin once a week. 10/09/22   Marrianne Mood, MD    ___________________________________________________________________________________________________ Physical Exam:    06/02/2023   10:15 PM 06/02/2023    9:25 PM 06/02/2023    8:15 PM  Vitals with BMI  Systolic 122 121   Diastolic 79 72   Pulse  93 94     1. General:   in No ***Acute distress***increased work of breathing ***complaining of severe pain****agitated * Chronically ill *well *cachectic *toxic acutely ill -appearing 2. Psychological: Alert and *** Oriented 3. Head/ENT:   Moist *** Dry Mucous Membranes                          Head Non traumatic, neck supple                          Normal *** Poor Dentition 4. SKIN: normal *** decreased Skin turgor,  Skin clean Dry and intact no rash    5. Heart: Regular rate and rhythm no*** Murmur, no Rub or gallop 6. Lungs: ***Clear to auscultation bilaterally, no wheezes or crackles   7. Abdomen: Soft, ***non-tender, Non distended *** obese ***bowel sounds present 8. Lower extremities: no clubbing, cyanosis, no ***edema 9. Neurologically Grossly intact, moving all 4 extremities equally *** strength 5 out of 5 in all 4 extremities cranial nerves II through XII intact 10. MSK: Normal range of motion    Chart has been reviewed  ______________________________________________________________________________________________  Assessment/Plan  ***  Admitted for *** There are no diagnoses linked to this encounter.   Present on Admission: . Hyperosmolar hyperglycemic state (HHS) (HCC) . Hyperlipidemia     Body mass index (BMI) of 50-59.9 in adult [facility]) Chronic morbid obesity affecting medical care.  Will need to follow-up as an outpatient nutrition  Hyperlipidemia Chronic stable continue Crestor 10 mg a day  Hyperosmolar hyperglycemic state (HHS) [facility]) will admit per  HHS protocol, obtain serial BMET, start on glucosestabalizer, aggressive IVF.   So far work up of possible causes of DKA/HSS with CXR, ECG one set of cardiac enzymes, UA. have been unremarkable   Monitor in Stepdown. Replace potassium as needed.   Consult diabetes coordinator     Other plan as per orders.  DVT prophylaxis:  SCD *** Lovenox       Code Status:    Code Status: Prior FULL CODE *** DNR/DNI ***comfort care as per  patient ***family  I had personally discussed CODE STATUS with patient and family*  ACP *** none has been reviewed ***   Family Communication:   Family not at  Bedside  plan of care was discussed on the phone with *** Son, Daughter, Wife, Husband, Sister, Brother , father, mother  Diet  Diet Orders (From admission, onward)     Start     Ordered   06/02/23 2032  Diet NPO time specified  Diet effective now        06/02/23 2033            Disposition Plan:   *** likely will need placement for rehabilitation                          Back to current facility when stable  To home once workup is complete and patient is stable  ***Following barriers for discharge:                             Chest pain *** Stroke *** work up is complete                            Electrolytes corrected                               Anemia corrected h/H stable                             Pain controlled with PO medications                               Afebrile, white count improving able to transition to PO antibiotics                             Will need to be able to tolerate PO                            Will likely need home health, home O2, set up                           Will need consultants to evaluate patient prior to discharge       Consult Orders  (From admission, onward)           Start     Ordered   06/02/23 2034  Consult to hospitalist  Called carelink @[phone]   Once       Provider:  (Not yet assigned)  Question Answer Comment  Place call to: [facility]   Reason for Consult Admit      06/02/23 2033                              ***Would benefit from PT/OT eval prior to DC  Ordered                   Swallow eval - SLP ordered                   Diabetes care coordinator                   Transition of care consulted                   Nutrition    consulted                  Wound care  consulted                   Palliative care     consulted                   Behavioral health  consulted                    Consults called: ***     Admission status:  ED Disposition  ED Disposition  Admit   Condition  --   Comment  H[facility] [[id number]]  Level of Care: Stepdown [14]  Admit to SDU based on following criteria: Severe physiological/psychological symptoms:  Any diagnosis requiring assessment & intervention at least every 4 hours on an ongoing basis to obtain desired patient outcomes including stability and rehabilitation  May admit patient to [facility] or [facility] if equivalent level of care is available:: Yes  Interfacility transfer: Yes  Covid Evaluation: Asymptomatic - no recent exposure (last 10 days) testing not required  Diagnosis: Hyperosmolar hyperglycemic state (HHS) [facility]) [[id number]]  Admitting Physician: Angie Fava [[id number]]  Attending Physician: Angie Fava [[id number]]  Certification:: I certify this patient will need inpatient services for at least 2 midnights  Expected Medical Readiness: 06/04/2023           Obs***  ***  inpatient     I Expect 2 midnight stay secondary to severity of patient's current illness need for inpatient interventions justified by the following: ***hemodynamic instability despite optimal treatment (tachycardia *hypotension * tachypnea *hypoxia, hypercapnia) * Severe lab/radiological/exam abnormalities including:     and extensive comorbidities including: *substance abuse  *Chronic pain *DM2  * CHF * CAD  * COPD/asthma *Morbid Obesity * CKD *dementia *liver disease *history of stroke with residual deficits *  malignancy, * sickle cell disease  History of amputation Chronic anticoagulation  That are currently affecting medical management.   I expect  patient to be hospitalized for 2 midnights requiring inpatient medical care.  Patient is at high risk for adverse outcome (such as loss of life or  disability) if not treated.  Indication for inpatient stay as follows:  Severe change from baseline regarding mental status Hemodynamic instability despite maximal medical therapy,  ongoing suicidal ideations,  severe pain requiring acute inpatient management,  inability to maintain oral hydration   persistent chest pain despite medical management Need for operative/procedural  intervention New or worsening hypoxia   Need for IV antibiotics, IV fluids, IV rate controling medications, IV antihypertensives, IV pain medications, IV anticoagulation, need for biPAP    Level of care   *** tele  For 12H 24H     medical floor       progressive     stepdown   tele indefinitely please discontinue once patient no longer qualifies COVID-19 Labs    Lab Results  Component Value Date   SARSCOV2NAA NEGATIVE 06/02/2023     Precautions: admitted as *** Covid Negative  ***asymptomatic screening protocol****PUI *** covid positive Airborne and Contact precautions ***If Covid PCR is negative  - please DC precautions - would need additional investigation given very high risk for false native test result    Critical***  Patient is critically ill due to  hemodynamic instability * respiratory failure *severe sepsis* ongoing chest pain*  They are at high risk for life/limb threatening clinical deterioration requiring frequent reassessment and modifications of care.  Services provided include examination of the patient, review of relevant ancillary tests, prescription of lifesaving therapies, review of medications and prophylactic therapy.  Total critical care time excluding separately billable procedures: 60*  Minutes.    Malloree Raboin 06/02/2023, 11:49 PM ***  [facility]     after 2 AM please page floor coverage PA If 7AM-7PM, please contact the day team taking care of the patient using [email or web address]

## 2023-06-02 NOTE — ED Notes (Signed)
ED TO INPATIENT HANDOFF REPORT  ED Nurse Name and Phone #:    S Name/Age/Gender Mark Schneider 42 y.o. male Room/Bed: DB014/DB014  Code Status   Code Status: Prior  Home/SNF/Other Home Patient oriented to: self, place, time, and situation Is this baseline? Yes   Triage Complete: Triage complete  Chief Complaint Hyperosmolar hyperglycemic state (HHS) (HCC) [E11.00]  Triage Note Sore throat, stomach ache, coughing, generalized pain all over,   Since wednesday   Allergies Allergies  Allergen Reactions   Sheep-Derived Products Swelling   Shellfish Allergy Swelling    Level of Care/Admitting Diagnosis ED Disposition     ED Disposition  Admit   Condition  --   Comment  Hospital Area: [facility] [[id number]]  Level of Care: Stepdown [14]  Admit to SDU based on following criteria: Severe physiological/psychological symptoms:  Any diagnosis requiring assessment & intervention at least every 4 hours on an ongoing basis to obtain desired patient outcomes including stability and rehabilitation  May admit patient to [facility] or [facility] if equivalent level of care is available:: Yes  Interfacility transfer: Yes  Covid Evaluation: Asymptomatic - no recent exposure (last 10 days) testing not required  Diagnosis: Hyperosmolar hyperglycemic state (HHS) [facility]) [[id number]]  Admitting Physician: Angie Fava [[id number]]  Attending Physician: Angie Fava [[id number]]  Certification:: I certify this patient will need inpatient services for at least 2 midnights  Expected Medical Readiness: 06/04/2023          B Medical/Surgery History Past Medical History:  Diagnosis Date   Acute renal failure (ARF) (HCC) 11/15/2017   DM (diabetes mellitus) (HCC)    Hypertension    Past Surgical History:  Procedure Laterality Date   DENTAL SURGERY     TOE SURGERY       A IV Location/Drains/Wounds Patient Lines/Drains/Airways Status     Active  Line/Drains/Airways     Name Placement date Placement time Site Days   Peripheral IV 06/02/23 20 G Anterior;Distal;Left;Upper Arm 06/02/23  1907  Arm  less than 1   Peripheral IV 06/02/23 20 G 1.88" Anterior;Distal;Right;Upper Arm 06/02/23  2050  Arm  less than 1            Intake/Output Last 24 hours  Intake/Output Summary (Last 24 hours) at 06/02/2023 2213 Last data filed at 06/02/2023 2201 Gross per 24 hour  Intake --  Output 825 ml  Net -825 ml    Labs/Imaging Results for orders placed or performed during the hospital encounter of 06/02/23 (from the past 48 hour(s))  Resp panel by RT-PCR (RSV, Flu A&B, Covid) Anterior Nasal Swab     Status: None   Collection Time: 06/02/23  5:24 PM   Specimen: Anterior Nasal Swab  Result Value Ref Range   SARS Coronavirus 2 by RT PCR NEGATIVE NEGATIVE    Comment: (NOTE) SARS-CoV-2 target nucleic acids are NOT DETECTED.  The SARS-CoV-2 RNA is generally detectable in upper respiratory specimens during the acute phase of infection. The lowest concentration of SARS-CoV-2 viral copies this assay can detect is 138 copies/mL. A negative result does not preclude SARS-Cov-2 infection and should not be used as the sole basis for treatment or other patient management decisions. A negative result may occur with  improper specimen collection/handling, submission of specimen other than nasopharyngeal swab, presence of viral mutation(s) within the areas targeted by this assay, and inadequate number of viral copies(<138 copies/mL). A negative result must be combined with clinical observations, patient history, and epidemiological  information. The expected result is Negative.  Fact Sheet for Patients:  BloggerCourse.com  Fact Sheet for Healthcare Providers:  SeriousBroker.it  This test is no t yet approved or cleared by the [place] FDA and  has been authorized for detection and/or diagnosis  of SARS-CoV-2 by FDA under an Emergency Use Authorization (EUA). This EUA will remain  in effect (meaning this test can be used) for the duration of the COVID-19 declaration under Section 564(b)(1) of the Act, 21 U.S.C.section 360bbb-3(b)(1), unless the authorization is terminated  or revoked sooner.       Influenza A by PCR NEGATIVE NEGATIVE   Influenza B by PCR NEGATIVE NEGATIVE    Comment: (NOTE) The Xpert Xpress SARS-CoV-2/FLU/RSV plus assay is intended as an aid in the diagnosis of influenza from Nasopharyngeal swab specimens and should not be used as a sole basis for treatment. Nasal washings and aspirates are unacceptable for Xpert Xpress SARS-CoV-2/FLU/RSV testing.  Fact Sheet for Patients: BloggerCourse.com  Fact Sheet for Healthcare Providers: SeriousBroker.it  This test is not yet approved or cleared by the [place] FDA and has been authorized for detection and/or diagnosis of SARS-CoV-2 by FDA under an Emergency Use Authorization (EUA). This EUA will remain in effect (meaning this test can be used) for the duration of the COVID-19 declaration under Section 564(b)(1) of the Act, 21 U.S.C. section 360bbb-3(b)(1), unless the authorization is terminated or revoked.     Resp Syncytial Virus by PCR NEGATIVE NEGATIVE    Comment: (NOTE) Fact Sheet for Patients: BloggerCourse.com  Fact Sheet for Healthcare Providers: SeriousBroker.it  This test is not yet approved or cleared by the [place] FDA and has been authorized for detection and/or diagnosis of SARS-CoV-2 by FDA under an Emergency Use Authorization (EUA). This EUA will remain in effect (meaning this test can be used) for the duration of the COVID-19 declaration under Section 564(b)(1) of the Act, 21 U.S.C. section 360bbb-3(b)(1), unless the authorization is terminated or revoked.  Performed at [facility], [address] [place], [place] [address]   Group A Strep by PCR     Status: None   Collection Time: 06/02/23  5:24 PM   Specimen: Anterior Nasal Swab; Sterile Swab  Result Value Ref Range   Group A Strep by PCR NOT DETECTED NOT DETECTED    Comment: Performed at [facility], [address] [place], [place] [address]  CBC with Differential     Status: None   Collection Time: 06/02/23  6:42 PM  Result Value Ref Range   WBC 7.8 4.0 - 10.5 K/uL   RBC 5.45 4.22 - 5.81 MIL/uL   Hemoglobin 16.1 13.0 - 17.0 g/dL   HCT 53.6 64.4 - 03.4 %   MCV 86.8 80.0 - 100.0 fL   MCH 29.5 26.0 - 34.0 pg   MCHC 34.0 30.0 - 36.0 g/dL   RDW 74.2 59.5 - 63.8 %   Platelets 196 150 - 400 K/uL   nRBC 0.0 0.0 - 0.2 %   Neutrophils Relative % 73 %   Neutro Abs 5.7 1.7 - 7.7 K/uL   Lymphocytes Relative 12 %   Lymphs Abs 1.0 0.7 - 4.0 K/uL   Monocytes Relative 10 %   Monocytes Absolute 0.8 0.1 - 1.0 K/uL   Eosinophils Relative 4 %   Eosinophils Absolute 0.3 0.0 - 0.5 K/uL   Basophils Relative 1 %   Basophils Absolute 0.1 0.0 - 0.1 K/uL   Immature Granulocytes 0 %  Abs Immature Granulocytes 0.02 0.00 - 0.07 K/uL    Comment: Performed at [facility], [address] [place], [place] [address]  Comprehensive metabolic panel     Status: Abnormal   Collection Time: 06/02/23  6:42 PM  Result Value Ref Range   Sodium 130 (L) 135 - 145 mmol/L   Potassium 5.3 (H) 3.5 - 5.1 mmol/L   Chloride 89 (L) 98 - 111 mmol/L   CO2 22 22 - 32 mmol/L   Glucose, Bld 754 (HH) 70 - 99 mg/dL    Comment: Glucose reference range applies only to samples taken after fasting for at least 8 hours. CRITICAL RESULT CALLED TO, READ BACK BY AND VERIFIED WITH: MAYNARD,S@1957  BY MATTHEWS, B 11.30.2024    BUN 33 (H) 6 - 20 mg/dL   Creatinine, Ser 6.21 (H) 0.61 - 1.24 mg/dL   Calcium 30.8 8.9 - 65.7 mg/dL   Total Protein 8.3 (H) 6.5 - 8.1 g/dL   Albumin 4.1 3.5 - 5.0  g/dL   AST 18 15 - 41 U/L   ALT 16 0 - 44 U/L   Alkaline Phosphatase 92 38 - 126 U/L   Total Bilirubin 0.6 <1.2 mg/dL   GFR, Estimated 33 (L) >60 mL/min    Comment: (NOTE) Calculated using the CKD-EPI Creatinine Equation (2021)    Anion gap 19 (H) 5 - 15    Comment: Performed at [facility], [address], [place], [place] [address]  Lipase, blood     Status: None   Collection Time: 06/02/23  6:42 PM  Result Value Ref Range   Lipase 39 11 - 51 U/L    Comment: Performed at [facility], [address], [place], [place] [address]  Urinalysis, Routine w reflex microscopic -Urine, Clean Catch     Status: Abnormal   Collection Time: 06/02/23  6:42 PM  Result Value Ref Range   Color, Urine COLORLESS (A) YELLOW   APPearance CLEAR CLEAR   Specific Gravity, Urine 1.030 1.005 - 1.030   pH 5.0 5.0 - 8.0   Glucose, UA >1,000 (A) NEGATIVE mg/dL   Hgb urine dipstick NEGATIVE NEGATIVE   Bilirubin Urine NEGATIVE NEGATIVE   Ketones, ur 15 (A) NEGATIVE mg/dL   Protein, ur NEGATIVE NEGATIVE mg/dL   Nitrite NEGATIVE NEGATIVE   Leukocytes,Ua NEGATIVE NEGATIVE   RBC / HPF 0-5 0 - 5 RBC/hpf   WBC, UA 0-5 0 - 5 WBC/hpf   Bacteria, UA NONE SEEN NONE SEEN   Squamous Epithelial / HPF 0-5 0 - 5 /HPF    Comment: Performed at [facility], [address] [place], [place] [address]  Troponin I (High Sensitivity)     Status: None   Collection Time: 06/02/23  6:42 PM  Result Value Ref Range   Troponin I (High Sensitivity) 11 <18 ng/L    Comment: (NOTE) Elevated high sensitivity troponin I (hsTnI) values and significant  changes across serial measurements may suggest ACS but many other  chronic and acute conditions are known to elevate hsTnI results.  Refer to the "Links" section for chest pain algorithms and additional  guidance. Performed at [facility], [address], [place], [place] [address]   I-Stat venous blood  gas, [facility], [facility], [facility])     Status: Abnormal   Collection Time: 06/02/23  8:18 PM  Result Value Ref Range   pH, Ven 7.355 7.25 - 7.43   pCO2, Ven 50.8 44 - 60 mmHg   pO2, Ven 25 (  LL) 32 - 45 mmHg   Bicarbonate 28.5 (H) 20.0 - 28.0 mmol/L   TCO2 30 22 - 32 mmol/L   O2 Saturation 43 %   Acid-Base Excess 2.0 0.0 - 2.0 mmol/L   Sodium 128 (L) 135 - 145 mmol/L   Potassium 5.8 (H) 3.5 - 5.1 mmol/L   Calcium, Ion 1.16 1.15 - 1.40 mmol/L   HCT 49.0 39.0 - 52.0 %   Hemoglobin 16.7 13.0 - 17.0 g/dL   Patient temperature 40.9 F    Collection site IV start    Drawn by Nurse    Sample type VENOUS    Comment NOTIFIED PHYSICIAN   Troponin I (High Sensitivity)     Status: None   Collection Time: 06/02/23  8:18 PM  Result Value Ref Range   Troponin I (High Sensitivity) 14 <18 ng/L    Comment: (NOTE) Elevated high sensitivity troponin I (hsTnI) values and significant  changes across serial measurements may suggest ACS but many other  chronic and acute conditions are known to elevate hsTnI results.  Refer to the "Links" section for chest pain algorithms and additional  guidance. Performed at [facility], [address] [place], [place] [address]   Mononucleosis screen     Status: None   Collection Time: 06/02/23  8:33 PM  Result Value Ref Range   Mono Screen NEGATIVE NEGATIVE    Comment: Performed at [facility], [address] [place], [place] [address]  CBG monitoring, ED     Status: Abnormal   Collection Time: 06/02/23  9:12 PM  Result Value Ref Range   Glucose-Capillary >600 (HH) 70 - 99 mg/dL    Comment: Glucose reference range applies only to samples taken after fasting for at least 8 hours.  CBG monitoring, ED     Status: Abnormal   Collection Time: 06/02/23  9:32 PM  Result Value Ref Range   Glucose-Capillary >600 (HH) 70 - 99 mg/dL    Comment: Glucose reference range applies only to samples taken after fasting for at least 8 hours.    CT ABDOMEN PELVIS W CONTRAST  Result Date: 06/02/2023 CLINICAL DATA:  Sore throat, cough, generalized pain and stomach ache. EXAM: CT ABDOMEN AND PELVIS WITH CONTRAST TECHNIQUE: Multidetector CT imaging of the abdomen and pelvis was performed using the standard protocol following bolus administration of intravenous contrast. RADIATION DOSE REDUCTION: This exam was performed according to the departmental dose-optimization program which includes automated exposure control, adjustment of the mA and/or kV according to patient size and/or use of iterative reconstruction technique. CONTRAST:  OMNIPAQUE IOHEXOL 300 MG/ML  SOLN COMPARISON:  Nov 14, 2022 FINDINGS: Lower chest: No acute abnormality. Hepatobiliary: No focal liver abnormality is seen. No gallstones, gallbladder wall thickening, or biliary dilatation. Pancreas: Unremarkable. No pancreatic ductal dilatation or surrounding inflammatory changes. Spleen: Normal in size without focal abnormality. Adrenals/Urinary Tract: Adrenal glands are unremarkable. Kidneys are normal, without renal calculi, focal lesion, or hydronephrosis. Bladder is unremarkable. Stomach/Bowel: Stomach is within normal limits. Appendix appears normal. No evidence of bowel wall thickening, distention, or inflammatory changes. Small, noninflamed diverticula are seen scattered throughout the large bowel. Vascular/Lymphatic: No significant vascular findings are present. No enlarged abdominal or pelvic lymph nodes. Reproductive: Prostate is unremarkable. Other: No abdominal wall hernia or abnormality. No abdominopelvic ascites. Musculoskeletal: No acute or significant osseous findings. IMPRESSION: 1. No acute or active process within the abdomen or pelvis. 2. Colonic diverticulosis. Electronically Signed   By: Demetrius Revel.D.  On: 06/02/2023 21:09   DG Chest 2 View  Result Date: 06/02/2023 CLINICAL DATA:  cough.  Loss of appetite. EXAM: CHEST - 2 VIEW COMPARISON:  Chest  x-ray 11/01/2020 FINDINGS: The heart and mediastinal contours are within normal limits. No focal consolidation. No pulmonary edema. No pleural effusion. No pneumothorax. No acute osseous abnormality. IMPRESSION: No active cardiopulmonary disease. Electronically Signed   By: Tish Frederickson M.D.   On: 06/02/2023 18:16    Pending Labs Unresulted Labs (From admission, onward)     Start     Ordered   06/02/23 2155  Osmolality  Once,   R        06/02/23 2155   06/02/23 2140  Basic metabolic panel  Once,   STAT        06/02/23 2140   06/02/23 2033  Basic metabolic panel  (Hyperglycemic Hyperosmolar State (HHS))  STAT Now then every 4 hours ,   STAT      06/02/23 2033   06/02/23 2004  Beta-hydroxybutyric acid  Once,   URGENT        06/02/23 2003            Vitals/Pain Today's Vitals   06/02/23 1749 06/02/23 1951 06/02/23 2015 06/02/23 2125  BP: 105/75   121/72  Pulse: 97  94 93  Resp: 17  (!) 26 (!) 24  Temp:    99.2 F (37.3 C)  TempSrc:    Oral  SpO2: 97%  96% 100%  Weight:      PainSc:  0-No pain      Isolation Precautions Airborne and Contact precautions  Medications Medications  insulin regular, human (MYXREDLIN) 100 units/ 100 mL infusion (13 Units/hr Intravenous Rate/Dose Verify 06/02/23 2134)  lactated ringers infusion (has no administration in time range)  dextrose 5 % in lactated ringers infusion (has no administration in time range)  dextrose 50 % solution 0-50 mL (has no administration in time range)  lactated ringers bolus 1,000 mL (1,000 mLs Intravenous New Bag/Given 06/02/23 2018)  lactated ringers bolus 3,402 mL (3,402 mLs Intravenous New Bag/Given 06/02/23 2120)  iohexol (OMNIPAQUE) 300 MG/ML solution 100 mL (100 mLs Intravenous Contrast Given 06/02/23 2049)    Mobility walks     Focused Assessments Cardiac Assessment Handoff:  Cardiac Rhythm: Normal sinus rhythm No results found for: "CKTOTAL", "CKMB", "CKMBINDEX", "TROPONINI" No results found for:  "DDIMER" Does the Patient currently have chest pain? No    R Recommendations: See Admitting Provider Note  Report given to:   Additional Notes:

## 2023-06-03 LAB — BASIC METABOLIC PANEL
Anion gap: 14 (ref 5–15)
Anion gap: 8 (ref 5–15)
Anion gap: 12 (ref 5–15)
BUN: 29 mg/dL — ABNORMAL HIGH (ref 6–20)
BUN: 26 mg/dL — ABNORMAL HIGH (ref 6–20)
BUN: 23 mg/dL — ABNORMAL HIGH (ref 6–20)
CO2: 25 mmol/L (ref 22–32)
CO2: 30 mmol/L (ref 22–32)
CO2: 29 mmol/L (ref 22–32)
Calcium: 9.4 mg/dL (ref 8.9–10.3)
Calcium: 9.1 mg/dL (ref 8.9–10.3)
Calcium: 9.4 mg/dL (ref 8.9–10.3)
Chloride: 98 mmol/L (ref 98–111)
Chloride: 101 mmol/L (ref 98–111)
Chloride: 99 mmol/L (ref 98–111)
Creatinine, Ser: 2.01 mg/dL — ABNORMAL HIGH (ref 0.61–1.24)
Creatinine, Ser: 1.81 mg/dL — ABNORMAL HIGH (ref 0.61–1.24)
Creatinine, Ser: 1.69 mg/dL — ABNORMAL HIGH (ref 0.61–1.24)
GFR, Estimated: 42 mL/min — ABNORMAL LOW (ref >60)
GFR, Estimated: 48 mL/min — ABNORMAL LOW (ref >60)
GFR, Estimated: 52 mL/min — ABNORMAL LOW (ref >60)
Glucose, Bld: 387 mg/dL — ABNORMAL HIGH (ref 70–99)
Glucose, Bld: 247 mg/dL — ABNORMAL HIGH (ref 70–99)
Glucose, Bld: 209 mg/dL — ABNORMAL HIGH (ref 70–99)
Potassium: 4.2 mmol/L (ref 3.5–5.1)
Potassium: 3.4 mmol/L — ABNORMAL LOW (ref 3.5–5.1)
Potassium: 3.9 mmol/L (ref 3.5–5.1)
Sodium: 137 mmol/L (ref 135–145)
Sodium: 139 mmol/L (ref 135–145)
Sodium: 140 mmol/L (ref 135–145)

## 2023-06-03 LAB — GLUCOSE, CAPILLARY
Glucose-Capillary: 190 mg/dL — ABNORMAL HIGH (ref 70–99)
Glucose-Capillary: 191 mg/dL — ABNORMAL HIGH (ref 70–99)
Glucose-Capillary: 194 mg/dL — ABNORMAL HIGH (ref 70–99)
Glucose-Capillary: 197 mg/dL — ABNORMAL HIGH (ref 70–99)
Glucose-Capillary: 212 mg/dL — ABNORMAL HIGH (ref 70–99)
Glucose-Capillary: 213 mg/dL — ABNORMAL HIGH (ref 70–99)
Glucose-Capillary: 214 mg/dL — ABNORMAL HIGH (ref 70–99)
Glucose-Capillary: 221 mg/dL — ABNORMAL HIGH (ref 70–99)
Glucose-Capillary: 234 mg/dL — ABNORMAL HIGH (ref 70–99)
Glucose-Capillary: 245 mg/dL — ABNORMAL HIGH (ref 70–99)
Glucose-Capillary: 246 mg/dL — ABNORMAL HIGH (ref 70–99)
Glucose-Capillary: 295 mg/dL — ABNORMAL HIGH (ref 70–99)
Glucose-Capillary: 399 mg/dL — ABNORMAL HIGH (ref 70–99)

## 2023-06-03 LAB — HEMOGLOBIN A1C
Hgb A1c MFr Bld: 14.1 % — ABNORMAL HIGH (ref 4.8–5.6)
Mean Plasma Glucose: 357.97 mg/dL

## 2023-06-03 LAB — OSMOLALITY
Osmolality: 343 mosm/kg (ref 275–295)
Osmolality: 318 mosm/kg — ABNORMAL HIGH (ref 275–295)

## 2023-06-03 LAB — PHOSPHORUS
Phosphorus: 1.8 mg/dL — ABNORMAL LOW (ref 2.5–4.6)
Phosphorus: 2.5 mg/dL (ref 2.5–4.6)

## 2023-06-03 LAB — BETA-HYDROXYBUTYRIC ACID: Beta-Hydroxybutyric Acid: 0.53 mmol/L — ABNORMAL HIGH (ref 0.05–0.27)

## 2023-06-03 LAB — CREATININE, URINE, RANDOM: Creatinine, Urine: 207 mg/dL

## 2023-06-03 LAB — SODIUM, URINE, RANDOM: Sodium, Ur: 70 mmol/L

## 2023-06-03 LAB — OSMOLALITY, URINE: Osmolality, Ur: 665 mosm/kg (ref 300–900)

## 2023-06-03 LAB — LACTIC ACID, PLASMA
Lactic Acid, Venous: 2.6 mmol/L (ref 0.5–1.9)
Lactic Acid, Venous: 1.9 mmol/L (ref 0.5–1.9)

## 2023-06-03 LAB — MAGNESIUM
Magnesium: 2.4 mg/dL (ref 1.7–2.4)
Magnesium: 2.6 mg/dL — ABNORMAL HIGH (ref 1.7–2.4)

## 2023-06-03 LAB — TSH: TSH: 1.333 u[IU]/mL (ref 0.350–4.500)

## 2023-06-03 LAB — CK: Total CK: 227 U/L (ref 49–397)

## 2023-06-03 MED ORDER — INSULIN ASPART 100 UNIT/ML IJ SOLN
0.0000 [IU] | Freq: Three times a day (TID) | INTRAMUSCULAR | Status: DC
  Administered 2023-06-03: 4 [IU] via SUBCUTANEOUS

## 2023-06-03 MED ORDER — ROSUVASTATIN CALCIUM 10 MG PO TABS: 10.0000 mg | ORAL_TABLET | Freq: Every day | ORAL | 0 refills | Status: DC

## 2023-06-03 MED ORDER — PEN NEEDLES 31G X 5 MM MISC: 1.0000 | Freq: Three times a day (TID) | 0 refills | Status: DC

## 2023-06-03 MED ORDER — ROSUVASTATIN CALCIUM 10 MG PO TABS
10.0000 mg | ORAL_TABLET | Freq: Every day | ORAL | Status: DC
  Administered 2023-06-03: 10 mg via ORAL
  Filled 2023-06-03: qty 1

## 2023-06-03 MED ORDER — LANCET DEVICE MISC: 1.0000 | Freq: Three times a day (TID) | 0 refills | Status: DC

## 2023-06-03 MED ORDER — LANCETS MISC: 1.0000 | Freq: Three times a day (TID) | 0 refills | Status: DC

## 2023-06-03 MED ORDER — METFORMIN HCL 1000 MG PO TABS: 1000.0000 mg | ORAL_TABLET | Freq: Two times a day (BID) | ORAL | 0 refills | Status: DC

## 2023-06-03 MED ORDER — LANCET DEVICE MISC: 1.0000 | Freq: Three times a day (TID) | 0 refills | Status: AC

## 2023-06-03 MED ORDER — BLOOD GLUCOSE TEST VI STRP: 1.0000 | ORAL_STRIP | Freq: Three times a day (TID) | 0 refills | Status: AC

## 2023-06-03 MED ORDER — HEPARIN SODIUM (PORCINE) 5000 UNIT/ML IJ SOLN
5000.0000 [IU] | Freq: Three times a day (TID) | INTRAMUSCULAR | Status: DC
  Administered 2023-06-03: 5000 [IU] via SUBCUTANEOUS
  Filled 2023-06-03: qty 1

## 2023-06-03 MED ORDER — LANCETS MISC: 1.0000 | Freq: Three times a day (TID) | 0 refills | Status: AC

## 2023-06-03 MED ORDER — INSULIN ASPART 100 UNIT/ML IJ SOLN: 0.0000 [IU] | Freq: Every day | INTRAMUSCULAR | Status: DC

## 2023-06-03 MED ORDER — NOVOLIN 70/30 FLEXPEN RELION (70-30) 100 UNIT/ML ~~LOC~~ SUPN: 25.0000 [IU] | PEN_INJECTOR | Freq: Two times a day (BID) | SUBCUTANEOUS | 0 refills | Status: DC

## 2023-06-03 MED ORDER — LIVING WELL WITH DIABETES BOOK
Freq: Once | Status: DC
  Filled 2023-06-03: qty 1

## 2023-06-03 MED ORDER — INSULIN STARTER KIT- PEN NEEDLES (ENGLISH)
1.0000 | Freq: Once | Status: DC
  Filled 2023-06-03: qty 1

## 2023-06-03 MED ORDER — BLOOD GLUCOSE MONITORING SUPPL DEVI: 1.0000 | Freq: Three times a day (TID) | 0 refills | Status: AC

## 2023-06-03 MED ORDER — INSULIN ASPART 100 UNIT/ML IJ SOLN
4.0000 [IU] | Freq: Three times a day (TID) | INTRAMUSCULAR | Status: DC
  Administered 2023-06-03: 4 [IU] via SUBCUTANEOUS

## 2023-06-03 MED ORDER — NOVOLIN 70/30 FLEXPEN RELION (70-30) 100 UNIT/ML ~~LOC~~ SUPN: 25.0000 [IU] | PEN_INJECTOR | SUBCUTANEOUS | 0 refills | Status: DC

## 2023-06-03 MED ORDER — CHLORHEXIDINE GLUCONATE CLOTH 2 % EX PADS
6.0000 | MEDICATED_PAD | Freq: Every day | CUTANEOUS | Status: DC
Start: 2023-06-03 — End: 2023-06-03

## 2023-06-03 MED ORDER — BLOOD GLUCOSE TEST VI STRP: 1.0000 | ORAL_STRIP | Freq: Three times a day (TID) | 0 refills | Status: DC

## 2023-06-03 MED ORDER — AMLODIPINE BESYLATE 10 MG PO TABS: 10.0000 mg | ORAL_TABLET | Freq: Every day | ORAL | 0 refills | Status: DC

## 2023-06-03 MED ORDER — INSULIN GLARGINE-YFGN 100 UNIT/ML ~~LOC~~ SOLN
30.0000 [IU] | SUBCUTANEOUS | Status: DC
  Administered 2023-06-03: 30 [IU] via SUBCUTANEOUS
  Filled 2023-06-03 (×3): qty 0.3

## 2023-06-03 MED ORDER — BLOOD GLUCOSE MONITORING SUPPL DEVI: 1.0000 | Freq: Three times a day (TID) | 0 refills | Status: DC

## 2023-06-03 NOTE — Plan of Care (Signed)
Problem: Education: Goal: Knowledge of General Education information will improve Description: Including pain rating scale, medication(s)/side effects and non-pharmacologic comfort measures Outcome: Adequate for Discharge   Problem: Health Behavior/Discharge Planning: Goal: Ability to manage health-related needs will improve Outcome: Adequate for Discharge   Problem: Clinical Measurements: Goal: Ability to maintain clinical measurements within normal limits will improve Outcome: Adequate for Discharge Goal: Will remain free from infection Outcome: Adequate for Discharge Goal: Diagnostic test results will improve Outcome: Adequate for Discharge Goal: Respiratory complications will improve Outcome: Adequate for Discharge Goal: Cardiovascular complication will be avoided Outcome: Adequate for Discharge   Problem: Activity: Goal: Risk for activity intolerance will decrease Outcome: Adequate for Discharge   Problem: Nutrition: Goal: Adequate nutrition will be maintained Outcome: Adequate for Discharge   Problem: Coping: Goal: Level of anxiety will decrease Outcome: Adequate for Discharge   Problem: Elimination: Goal: Will not experience complications related to bowel motility Outcome: Adequate for Discharge Goal: Will not experience complications related to urinary retention Outcome: Adequate for Discharge   Problem: Pain Management: Goal: General experience of comfort will improve Outcome: Adequate for Discharge   Problem: Safety: Goal: Ability to remain free from injury will improve Outcome: Adequate for Discharge   Problem: Skin Integrity: Goal: Risk for impaired skin integrity will decrease Outcome: Adequate for Discharge   Problem: Education: Goal: Ability to describe self-care measures that may prevent or decrease complications (Diabetes Survival Skills Education) will improve Outcome: Adequate for Discharge Goal: Individualized Educational Video(s) Outcome:  Adequate for Discharge   Problem: Coping: Goal: Ability to adjust to condition or change in health will improve Outcome: Adequate for Discharge   Problem: Fluid Volume: Goal: Ability to maintain a balanced intake and output will improve Outcome: Adequate for Discharge   Problem: Health Behavior/Discharge Planning: Goal: Ability to identify and utilize available resources and services will improve Outcome: Adequate for Discharge Goal: Ability to manage health-related needs will improve Outcome: Adequate for Discharge   Problem: Metabolic: Goal: Ability to maintain appropriate glucose levels will improve Outcome: Adequate for Discharge   Problem: Nutritional: Goal: Maintenance of adequate nutrition will improve Outcome: Adequate for Discharge Goal: Progress toward achieving an optimal weight will improve Outcome: Adequate for Discharge   Problem: Skin Integrity: Goal: Risk for impaired skin integrity will decrease Outcome: Adequate for Discharge   Problem: Tissue Perfusion: Goal: Adequacy of tissue perfusion will improve Outcome: Adequate for Discharge   Problem: Education: Goal: Ability to describe self-care measures that may prevent or decrease complications (Diabetes Survival Skills Education) will improve Outcome: Adequate for Discharge Goal: Individualized Educational Video(s) Outcome: Adequate for Discharge   Problem: Cardiac: Goal: Ability to maintain an adequate cardiac output will improve Outcome: Adequate for Discharge   Problem: Health Behavior/Discharge Planning: Goal: Ability to identify and utilize available resources and services will improve Outcome: Adequate for Discharge Goal: Ability to manage health-related needs will improve Outcome: Adequate for Discharge   Problem: Fluid Volume: Goal: Ability to achieve a balanced intake and output will improve Outcome: Adequate for Discharge   Problem: Metabolic: Goal: Ability to maintain appropriate  glucose levels will improve Outcome: Adequate for Discharge   Problem: Nutritional: Goal: Maintenance of adequate nutrition will improve Outcome: Adequate for Discharge Goal: Maintenance of adequate weight for body size and type will improve Outcome: Adequate for Discharge   Problem: Respiratory: Goal: Will regain and/or maintain adequate ventilation Outcome: Adequate for Discharge   Problem: Urinary Elimination: Goal: Ability to achieve and maintain adequate renal perfusion and functioning will improve  Outcome: Adequate for Discharge

## 2023-06-03 NOTE — ED Provider Notes (Signed)
[facility]-ICU/STEPDOWN Provider Note   CSN: [id number] Arrival date & time: 06/02/23  1715     History Chief Complaint  Patient presents with   URI   Abdominal Pain    Mark Schneider is a 42 y.o. male history of hypertension, hyperlipidemia, and diabetes presents emerged from today for evaluation of sore throat and abdominal pain for the past 4 days.  Denies any rhinorrhea, nasal, fever, chills.  Reports an occasional dry cough.  He reports that mainly it started with lower abdominal pain and then progressed to some nausea.  He did have 1 episode of emesis after drinking some orange juice.  He denies any diarrhea or any constipation.  Still would pass gas.  Reports he has been having decreased p.o. intake since because of his sore throat and nausea, but then mentions he is been having increase in thirst.  Last bowel movement was today.  No hematochezia or any melena.  Denies any dysuria or hematuria.  He reports compliancy with his medications however request that he will run out of them and needs refills.  Currently on metformin for diabetes.  He reports that he is on 1 medication for blood pressure and 1 medication for cholesterol however does not know the names.  He denies any allergies to any medications.   URI Presenting symptoms: sore throat   Presenting symptoms: no congestion, no fever and no rhinorrhea   Abdominal Pain Associated symptoms: nausea, sore throat and vomiting   Associated symptoms: no chest pain, no chills, no constipation, no diarrhea, no dysuria, no fever, no hematuria and no shortness of breath        Home Medications Prior to Admission medications   Medication Sig Start Date End Date Taking? Authorizing Provider  Accu-Chek Softclix Lancets lancets Use as instructed Patient not taking: Reported on 11/14/2022 10/09/22   Marrianne Mood, MD  amLODipine (NORVASC) 10 MG tablet Take 1 tablet (10 mg total) by mouth daily. 10/09/22   Marrianne Mood, MD  Blood Glucose Monitoring Suppl (ACCU-CHEK GUIDE ME) w/Device KIT Use in the morning to check fasting blood glucose. Patient not taking: Reported on 11/14/2022 10/09/22   Marrianne Mood, MD  doxycycline (VIBRAMYCIN) 100 MG capsule Take 1 capsule (100 mg total) by mouth 2 (two) times daily. 05/20/23   Lorre Nick, MD  empagliflozin (JARDIANCE) 10 MG TABS tablet Take 1 tablet (10 mg total) by mouth daily before breakfast. 10/09/22   Marrianne Mood, MD  glipiZIDE (GLUCOTROL) 5 MG tablet Take 1 tablet (5 mg total) by mouth 2 (two) times daily before a meal. Patient not taking: Reported on 11/14/2022 10/09/22 01/07/23  Marrianne Mood, MD  glucose blood (ACCU-CHEK GUIDE) test strip Use to check blood sugar once daily before the first meal of the day. Patient not taking: Reported on 11/14/2022 10/10/22   Marrianne Mood, MD  losartan (COZAAR) 100 MG tablet TAKE 1 TABLET BY MOUTH EVERY DAY Patient not taking: Reported on 11/14/2022 10/25/22   Marrianne Mood, MD  metFORMIN (GLUCOPHAGE) 1000 MG tablet Take 1 tablet (1,000 mg total) by mouth 2 (two) times daily with a meal. 04/12/22   de [place], Buren Kos, MD  ondansetron (ZOFRAN-ODT) 4 MG disintegrating tablet Take 1 tablet (4 mg total) by mouth every 8 (eight) hours as needed for nausea or vomiting. 11/14/22   Pricilla Loveless, MD  rosuvastatin (CRESTOR) 10 MG tablet Take 1 tablet (10 mg total) by mouth daily. 04/12/22   de [place], Raymond J, MD  Semaglutide,0.25  or 0.5MG /DOS, 2 MG/3ML SOPN Inject 0.25 mg into the skin once a week. 10/09/22   Marrianne Mood, MD      Allergies    Sheep-derived products and Shellfish allergy    Review of Systems   Review of Systems  Constitutional:  Negative for chills and fever.  HENT:  Positive for sore throat. Negative for congestion and rhinorrhea.   Respiratory:  Negative for shortness of breath.   Cardiovascular:  Negative for chest pain.  Gastrointestinal:  Positive for abdominal pain, nausea and  vomiting. Negative for blood in stool, constipation, diarrhea and rectal pain.  Genitourinary:  Negative for dysuria and hematuria.    Physical Exam Updated Vital Signs BP 122/79   Pulse 93   Temp 99.2 F (37.3 C) (Oral)   Resp (!) 24   Ht 5\' 10"  (1.778 m)   Wt (!) 151.3 kg   SpO2 100%   BMI 47.86 kg/m  Physical Exam Vitals and nursing note reviewed.  Constitutional:      General: He is not in acute distress.    Appearance: He is not toxic-appearing.  HENT:     Mouth/Throat:     Mouth: Mucous membranes are dry.     Comments: Bilateral tonsillar exudate without erythema or significant edema. Uvula midline. Airway patent. Dry mucous membranes. No trismus. Normal speech.  Eyes:     General: No scleral icterus. Cardiovascular:     Rate and Rhythm: Normal rate.  Pulmonary:     Effort: Pulmonary effort is normal. No respiratory distress.     Breath sounds: Normal breath sounds.  Abdominal:     General: Abdomen is protuberant.     Palpations: Abdomen is soft.     Tenderness: There is abdominal tenderness in the right lower quadrant, epigastric area, suprapubic area and left lower quadrant. There is no guarding or rebound.  Feet:     Comments: Does have some purulence noted to the more medial aspect of the left great toe.  No active drainage. Skin:    General: Skin is warm and dry.  Neurological:     Mental Status: He is alert.     ED Results / Procedures / Treatments   Labs (all labs ordered are listed, but only abnormal results are displayed) Labs Reviewed  COMPREHENSIVE METABOLIC PANEL - Abnormal; Notable for the following components:      Result Value   Sodium 130 (*)    Potassium 5.3 (*)    Chloride 89 (*)    Glucose, Bld 754 (*)    BUN 33 (*)    Creatinine, Ser 2.46 (*)    Total Protein 8.3 (*)    GFR, Estimated 33 (*)    Anion gap 19 (*)    All other components within normal limits  URINALYSIS, ROUTINE W REFLEX MICROSCOPIC - Abnormal; Notable for the  following components:   Color, Urine COLORLESS (*)    Glucose, UA >1,000 (*)    Ketones, ur 15 (*)    All other components within normal limits  BETA-HYDROXYBUTYRIC ACID - Abnormal; Notable for the following components:   Beta-Hydroxybutyric Acid 5.87 (*)    All other components within normal limits  BASIC METABOLIC PANEL - Abnormal; Notable for the following components:   Sodium 133 (*)    Potassium 5.8 (*)    Chloride 94 (*)    Glucose, Bld 630 (*)    BUN 31 (*)    Creatinine, Ser 2.31 (*)    GFR, Estimated 36 (*)  Anion gap 17 (*)    All other components within normal limits  OSMOLALITY - Abnormal; Notable for the following components:   Osmolality 343 (*)    All other components within normal limits  GLUCOSE, CAPILLARY - Abnormal; Notable for the following components:   Glucose-Capillary 399 (*)    All other components within normal limits  GLUCOSE, CAPILLARY - Abnormal; Notable for the following components:   Glucose-Capillary 295 (*)    All other components within normal limits  I-STAT VENOUS BLOOD GAS, ED - Abnormal; Notable for the following components:   pO2, Ven 25 (*)    Bicarbonate 28.5 (*)    Sodium 128 (*)    Potassium 5.8 (*)    All other components within normal limits  CBG MONITORING, ED - Abnormal; Notable for the following components:   Glucose-Capillary >600 (*)    All other components within normal limits  CBG MONITORING, ED - Abnormal; Notable for the following components:   Glucose-Capillary >600 (*)    All other components within normal limits  CBG MONITORING, ED - Abnormal; Notable for the following components:   Glucose-Capillary 518 (*)    All other components within normal limits  CBG MONITORING, ED - Abnormal; Notable for the following components:   Glucose-Capillary 496 (*)    All other components within normal limits  CBG MONITORING, ED - Abnormal; Notable for the following components:   Glucose-Capillary 456 (*)    All other components  within normal limits  RESP PANEL BY RT-PCR (RSV, FLU A&B, COVID)  RVPGX2  GROUP A STREP BY PCR  MRSA NEXT GEN BY PCR, NASAL  CBC WITH DIFFERENTIAL/PLATELET  LIPASE, BLOOD  MONONUCLEOSIS SCREEN  BASIC METABOLIC PANEL  BASIC METABOLIC PANEL  BASIC METABOLIC PANEL  BASIC METABOLIC PANEL  MAGNESIUM  PHOSPHORUS  CK  BASIC METABOLIC PANEL  BASIC METABOLIC PANEL  BASIC METABOLIC PANEL  BASIC METABOLIC PANEL  BASIC METABOLIC PANEL  HEMOGLOBIN A1C  MAGNESIUM  PHOSPHORUS  BETA-HYDROXYBUTYRIC ACID  LACTIC ACID, PLASMA  LACTIC ACID, PLASMA  CREATININE, URINE, RANDOM  OSMOLALITY, URINE  SODIUM, URINE, RANDOM  TSH  OSMOLALITY  TROPONIN I (HIGH SENSITIVITY)  TROPONIN I (HIGH SENSITIVITY)    EKG None  Radiology CT ABDOMEN PELVIS W CONTRAST  Result Date: 06/02/2023 CLINICAL DATA:  Sore throat, cough, generalized pain and stomach ache. EXAM: CT ABDOMEN AND PELVIS WITH CONTRAST TECHNIQUE: Multidetector CT imaging of the abdomen and pelvis was performed using the standard protocol following bolus administration of intravenous contrast. RADIATION DOSE REDUCTION: This exam was performed according to the departmental dose-optimization program which includes automated exposure control, adjustment of the mA and/or kV according to patient size and/or use of iterative reconstruction technique. CONTRAST:  OMNIPAQUE IOHEXOL 300 MG/ML  SOLN COMPARISON:  Nov 14, 2022 FINDINGS: Lower chest: No acute abnormality. Hepatobiliary: No focal liver abnormality is seen. No gallstones, gallbladder wall thickening, or biliary dilatation. Pancreas: Unremarkable. No pancreatic ductal dilatation or surrounding inflammatory changes. Spleen: Normal in size without focal abnormality. Adrenals/Urinary Tract: Adrenal glands are unremarkable. Kidneys are normal, without renal calculi, focal lesion, or hydronephrosis. Bladder is unremarkable. Stomach/Bowel: Stomach is within normal limits. Appendix appears normal. No  evidence of bowel wall thickening, distention, or inflammatory changes. Small, noninflamed diverticula are seen scattered throughout the large bowel. Vascular/Lymphatic: No significant vascular findings are present. No enlarged abdominal or pelvic lymph nodes. Reproductive: Prostate is unremarkable. Other: No abdominal wall hernia or abnormality. No abdominopelvic ascites. Musculoskeletal: No acute or significant osseous findings.  IMPRESSION: 1. No acute or active process within the abdomen or pelvis. 2. Colonic diverticulosis. Electronically Signed   By: Aram Candela M.D.   On: 06/02/2023 21:09   DG Chest 2 View  Result Date: 06/02/2023 CLINICAL DATA:  cough.  Loss of appetite. EXAM: CHEST - 2 VIEW COMPARISON:  Chest x-ray 11/01/2020 FINDINGS: The heart and mediastinal contours are within normal limits. No focal consolidation. No pulmonary edema. No pleural effusion. No pneumothorax. No acute osseous abnormality. IMPRESSION: No active cardiopulmonary disease. Electronically Signed   By: Tish Frederickson M.D.   On: 06/02/2023 18:16    Procedures .Critical Care  Performed by: Achille Rich, PA-C Authorized by: Achille Rich, PA-C   Critical care provider statement:    Critical care time (minutes):  45   Critical care was necessary to treat or prevent imminent or life-threatening deterioration of the following conditions:  Endocrine crisis (HHS)   Critical care was time spent personally by me on the following activities:  Development of treatment plan with patient or surrogate, discussions with consultants, evaluation of patient's response to treatment, examination of patient, ordering and review of laboratory studies, ordering and review of radiographic studies, ordering and performing treatments and interventions, pulse oximetry, re-evaluation of patient's condition, review of old charts and obtaining history from patient or surrogate   Care discussed with: admitting provider      Medications  Ordered in ED Medications  insulin regular, human (MYXREDLIN) 100 units/ 100 mL infusion (9 Units/hr Intravenous New Bag/Given 06/03/23 0043)  lactated ringers infusion (400 mLs Intravenous New Bag/Given 06/02/23 2344)  dextrose 5 % in lactated ringers infusion (has no administration in time range)  dextrose 50 % solution 0-50 mL (has no administration in time range)  Chlorhexidine Gluconate Cloth 2 % PADS 6 each (has no administration in time range)  lactated ringers bolus 1,000 mL (1,000 mLs Intravenous New Bag/Given 06/02/23 2018)  lactated ringers bolus 3,402 mL (3,402 mLs Intravenous New Bag/Given 06/02/23 2120)  iohexol (OMNIPAQUE) 300 MG/ML solution 100 mL (100 mLs Intravenous Contrast Given 06/02/23 2049)    ED Course/ Medical Decision Making/ A&P                               Medical Decision Making Amount and/or Complexity of Data Reviewed Labs: ordered. Radiology: ordered.  Risk Prescription drug management. Decision regarding hospitalization.   42 y.o. male presents to the ER for evaluation of abdominal pain, sore throat. Differential diagnosis includes but is not limited to AAA, mesenteric ischemia, appendicitis, diverticulitis, DKA, gastroenteritis, nephrolithiasis, pancreatitis, constipation, UTI, bowel obstruction, biliary disease, IBD, PUD, hepatitis, Viral pharyngitis, strep pharyngitis, dental caries/abscess, esophagitis, sinusitis, post nasal drip, reflux, angioedema, RTA/PTA, Ludwig's angina. Vital signs borderline tachycardia otherwise unremarkable.Marland Kitchen Physical exam as noted above.   On previous evaluation, patient has been seen for appendicolith, diverticulitis, and upper abdominal pain before earlier this year.  Was recently seen a few weeks ago for ingrown toenail.  Does have some very mild tenderness palpation.  Concern for patient's thirst with history of diabetes.  Will obtain basic labs and order CT imaging as well.  He does have some exudate on his tonsils.  Lab  work that was ordered in triage shows negative strep, COVID, flu, RSV.  Will add on mono.  I independently reviewed and interpreted the patient's labs.  CMP shows glucose of 7 and 54 with pseudohyponatremia at 130.  Potassium 5.3 with chloride at 89.  Creatinine 2.46.  Appears patient's baseline is around 1.8, meets arteria for AKI.  BUN at 33.  Anion gap of 19.  Urinalysis shows colorless urine with greater than thousand glucose present.  15 ketones present as well.  No other signs of infection.  CBC without leukocytosis or anemia.  VBG shows a pH of 7.35.  Lipase normal.  Troponin at 14.  Beta-hydroxybutyrate acid pending.  Mono is negative.  Osmolality is pending.  CT imaging shows 1. No acute or active process within the abdomen or pelvis. 2. Colonic diverticulosis. Per radiologist's interpretation.   Patient was immediately started on fluids.  Patient has a mixed picture of possible DKA versus HHS.  Patient is not disoriented and appears appropriate and alert.  Given the normal pH as well as normal bicarb, I think this likely fits more of an HHS picture.  Serum osmolality was ordered however calculated at 314.  Patient was started on insulin per HHS guidelines as well.  Mild hyperkalemia however this should improve with the insulin given.  CBG rechecked and still shows over 600.  Second BMP pending.  Patient's been given 3.4 L ordered for improving his hyperglycemia as well as his AKI.  Given his lab derangement, will need admission for both the HHS and the AKI.  Patient still does not appear in any acute distress.  Discussed with him that he will need to be admitted.  For the sore throat, I am unsure if this is some viral process.  He does have some exudate however his monotest is negative.  He is uvula is midline his airway is patent he is controlling secretions.  Normal speech.  Question may be yeast?  I do not auscultate any stridor.  I did doubt any retropharyngeal abscess.  Admitted to [facility]  hospitalist for transfer over to first available main hospital.  Portions of this report may have been transcribed using voice recognition software. Every effort was made to ensure accuracy; however, inadvertent computerized transcription errors may be present.   Final Clinical Impression(s) / ED Diagnoses Final diagnoses:  HHS (hypothenar hammer syndrome) (HCC)  AKI (acute kidney injury) (HCC)  Lower abdominal pain    Rx / DC Orders ED Discharge Orders     None         Achille Rich, PA-C 06/03/23 0147    Laurence Spates, MD 06/03/23 [phone]

## 2023-06-03 NOTE — Progress Notes (Signed)
Patient given discharge instructions. Patient belongings all returned to patient and peripheral IV s removed. Patient walked out of unit and discharged home.

## 2023-06-03 NOTE — Inpatient Diabetes Management (Signed)
Inpatient Diabetes Program Recommendations  AACE/ADA: New Consensus Statement on Inpatient Glycemic Control (2015)  Target Ranges:  Prepandial:   less than 140 mg/dL      Peak postprandial:   less than 180 mg/dL (1-2 hours)      Critically ill patients:  140 - 180 mg/dL   Lab Results  Component Value Date   GLUCAP 212 (H) 06/03/2023   HGBA1C 14.1 (H) 06/03/2023    Review of Glycemic Control  Diabetes history: Type 2 DM Outpatient Diabetes medications: Metformin Current orders for Inpatient glycemic control: IV insulin to Semglee 30 units every day, Novolog 0-20 units TID & HS  Inpatient Diabetes Program Recommendations:    Spoke with patient regarding outpatient diabetes managment. Patient unable to afford medications.  Reviewed patient's current A1c of 14.1%. Explained what a A1c is and what it measures. Also reviewed goal A1c with patient, importance of good glucose control @ home, and blood sugar goals. Reviewed patho of DM, need for improved glycemic control, survival skills, interventions, when to to take insulin, 70/30, relion products, when to call MD, vascular changes and commorbidities.  Patient needs a meter. Will place CM consult. Reviewed recommended frequency and when to call MD. Denies drinking sugary beverages. Encouraged to continue with CHO mindfulness. Dietitian consult placed.   Thanks, Lujean Rave, MSN, RNC-OB Diabetes Coordinator [phone] (8a-5p)

## 2023-06-03 NOTE — Assessment & Plan Note (Signed)
In the setting of dehydration from hyper glycemia.  Will control blood sugars monitor renal function rehydrate aggressively

## 2023-06-05 NOTE — Discharge Summary (Signed)
Physician Discharge Summary   Patient: Mark Schneider MRN: [id number] DOB: [birth year]  Admit date:     06/02/2023  Discharge date: 06/03/2023  Discharge Physician: Lynden Oxford  PCP: Marrianne Mood, MD  Recommendations at discharge:  Follow up with PCP  as recommendation    Follow-up Information     Marrianne Mood, MD Follow up.   Specialty: Internal Medicine Why: With blood sugar log Contact information: [address] [place] [address] [phone]                 Discharge Diagnoses: Principal Problem:   Hyperosmolar hyperglycemic state (HHS) (HCC) Active Problems:   AKI (acute kidney injury) (HCC)   Body mass index (BMI) of 50-59.9 in adult [facility])   Hyperlipidemia  Hospital Course: DKA Less likely hyperosmolar state Anion gap is 19 Also has AKI Treated with insulin drip.  Now on oral diet. And sliding scale as well as basal insulin.  Will get NPH like insulin on discharge.  HbA1c 10+ Was not taking farxiga before admission  AKI on CKD IIIb Baseline creat is 1.8  On admission creat 2.46 Treated with hydration with improvement to 1.7  Hyperkalemia  Corrected.   Hypophosphatemia  Corrected  Class 3 obesity  Body mass index is 47.86 kg/m.  Placing the pt at higher risk of poor outocome.   HTN Continue home med  Consultants:  none  Procedures performed:  none  DISCHARGE MEDICATION: Allergies as of 06/03/2023       Reactions   Sheep-derived Products Swelling   Shellfish Allergy Swelling        Medication List     STOP taking these medications    doxycycline 100 MG capsule Commonly known as: VIBRAMYCIN       TAKE these medications    amLODipine 10 MG tablet Commonly known as: NORVASC Take 1 tablet (10 mg total) by mouth daily.   Blood Glucose Monitoring Suppl Devi 1 each by Does not apply route 3 (three) times daily. May dispense any manufacturer covered by patient's insurance.   BLOOD GLUCOSE TEST STRIPS  Strp 1 each by Does not apply route 3 (three) times daily. Use as directed to check blood sugar. May dispense any manufacturer covered by patient's insurance and fits patient's device.   Lancet Device Misc 1 each by Does not apply route 3 (three) times daily. May dispense any manufacturer covered by patient's insurance.   Lancets Misc 1 each by Does not apply route 3 (three) times daily. Use as directed to check blood sugar. May dispense any manufacturer covered by patient's insurance and fits patient's device.   metFORMIN 1000 MG tablet Commonly known as: GLUCOPHAGE Take 1 tablet (1,000 mg total) by mouth 2 (two) times daily with a meal.   NovoLIN 70/30 Kwikpen (70-30) 100 UNIT/ML KwikPen Generic drug: insulin isophane & regular human KwikPen Inject 25 Units into the skin as directed. Take 10 unit on 06/03/2023, take 25 units 2 times daily with meals starting 06/04/2023   Pen Needles 31G X 5 MM Misc 1 each by Does not apply route 3 (three) times daily. May dispense any manufacturer covered by patient's insurance.   rosuvastatin 10 MG tablet Commonly known as: Crestor Take 1 tablet (10 mg total) by mouth daily.       Disposition: Home Diet recommendation: Carb modified diet  Discharge Exam: Vitals:   06/03/23 1100 06/03/23 1200 06/03/23 1300 06/03/23 1400  BP:  (!) 165/98 94/66   Pulse: 92 90 93  87  Resp: 20 19 (!) 28 20  Temp:      TempSrc:      SpO2: (!) 89% 95% 94% 92%  Weight:      Height:       General: Appear in mild distress; no visible Abnormal Neck Mass Or lumps, Conjunctiva normal Cardiovascular: S1 and S2 Present, no Murmur, Respiratory: good respiratory effort, Bilateral Air entry present and CTA, no Crackles, no wheezes Abdomen: Bowel Sound present, Non tender  Extremities: no Pedal edema Neurology: alert and oriented to time, place, and person  Filed Weights   06/02/23 1723 06/03/23 0107  Weight: (!) 170.1 kg (!) 151.3 kg   Condition at discharge:  stable  The results of significant diagnostics from this hospitalization (including imaging, microbiology, ancillary and laboratory) are listed below for reference.   Imaging Studies: CT ABDOMEN PELVIS W CONTRAST  Result Date: 06/02/2023 CLINICAL DATA:  Sore throat, cough, generalized pain and stomach ache. EXAM: CT ABDOMEN AND PELVIS WITH CONTRAST TECHNIQUE: Multidetector CT imaging of the abdomen and pelvis was performed using the standard protocol following bolus administration of intravenous contrast. RADIATION DOSE REDUCTION: This exam was performed according to the departmental dose-optimization program which includes automated exposure control, adjustment of the mA and/or kV according to patient size and/or use of iterative reconstruction technique. CONTRAST:  OMNIPAQUE IOHEXOL 300 MG/ML  SOLN COMPARISON:  Nov 14, 2022 FINDINGS: Lower chest: No acute abnormality. Hepatobiliary: No focal liver abnormality is seen. No gallstones, gallbladder wall thickening, or biliary dilatation. Pancreas: Unremarkable. No pancreatic ductal dilatation or surrounding inflammatory changes. Spleen: Normal in size without focal abnormality. Adrenals/Urinary Tract: Adrenal glands are unremarkable. Kidneys are normal, without renal calculi, focal lesion, or hydronephrosis. Bladder is unremarkable. Stomach/Bowel: Stomach is within normal limits. Appendix appears normal. No evidence of bowel wall thickening, distention, or inflammatory changes. Small, noninflamed diverticula are seen scattered throughout the large bowel. Vascular/Lymphatic: No significant vascular findings are present. No enlarged abdominal or pelvic lymph nodes. Reproductive: Prostate is unremarkable. Other: No abdominal wall hernia or abnormality. No abdominopelvic ascites. Musculoskeletal: No acute or significant osseous findings. IMPRESSION: 1. No acute or active process within the abdomen or pelvis. 2. Colonic diverticulosis. Electronically Signed    By: Aram Candela M.D.   On: 06/02/2023 21:09   DG Chest 2 View  Result Date: 06/02/2023 CLINICAL DATA:  cough.  Loss of appetite. EXAM: CHEST - 2 VIEW COMPARISON:  Chest x-ray 11/01/2020 FINDINGS: The heart and mediastinal contours are within normal limits. No focal consolidation. No pulmonary edema. No pleural effusion. No pneumothorax. No acute osseous abnormality. IMPRESSION: No active cardiopulmonary disease. Electronically Signed   By: Tish Frederickson M.D.   On: 06/02/2023 18:16    Microbiology: Results for orders placed or performed during the hospital encounter of 06/02/23  Resp panel by RT-PCR (RSV, Flu A&B, Covid) Anterior Nasal Swab     Status: None   Collection Time: 06/02/23  5:24 PM   Specimen: Anterior Nasal Swab  Result Value Ref Range Status   SARS Coronavirus 2 by RT PCR NEGATIVE NEGATIVE Final    Comment: (NOTE) SARS-CoV-2 target nucleic acids are NOT DETECTED.  The SARS-CoV-2 RNA is generally detectable in upper respiratory specimens during the acute phase of infection. The lowest concentration of SARS-CoV-2 viral copies this assay can detect is 138 copies/mL. A negative result does not preclude SARS-Cov-2 infection and should not be used as the sole basis for treatment or other patient management decisions. A negative result may occur  with  improper specimen collection/handling, submission of specimen other than nasopharyngeal swab, presence of viral mutation(s) within the areas targeted by this assay, and inadequate number of viral copies(<138 copies/mL). A negative result must be combined with clinical observations, patient history, and epidemiological information. The expected result is Negative.  Fact Sheet for Patients:  BloggerCourse.com  Fact Sheet for Healthcare Providers:  SeriousBroker.it  This test is no t yet approved or cleared by the [place] FDA and  has been authorized for detection  and/or diagnosis of SARS-CoV-2 by FDA under an Emergency Use Authorization (EUA). This EUA will remain  in effect (meaning this test can be used) for the duration of the COVID-19 declaration under Section 564(b)(1) of the Act, 21 U.S.C.section 360bbb-3(b)(1), unless the authorization is terminated  or revoked sooner.       Influenza A by PCR NEGATIVE NEGATIVE Final   Influenza B by PCR NEGATIVE NEGATIVE Final    Comment: (NOTE) The Xpert Xpress SARS-CoV-2/FLU/RSV plus assay is intended as an aid in the diagnosis of influenza from Nasopharyngeal swab specimens and should not be used as a sole basis for treatment. Nasal washings and aspirates are unacceptable for Xpert Xpress SARS-CoV-2/FLU/RSV testing.  Fact Sheet for Patients: BloggerCourse.com  Fact Sheet for Healthcare Providers: SeriousBroker.it  This test is not yet approved or cleared by the [place] FDA and has been authorized for detection and/or diagnosis of SARS-CoV-2 by FDA under an Emergency Use Authorization (EUA). This EUA will remain in effect (meaning this test can be used) for the duration of the COVID-19 declaration under Section 564(b)(1) of the Act, 21 U.S.C. section 360bbb-3(b)(1), unless the authorization is terminated or revoked.     Resp Syncytial Virus by PCR NEGATIVE NEGATIVE Final    Comment: (NOTE) Fact Sheet for Patients: BloggerCourse.com  Fact Sheet for Healthcare Providers: SeriousBroker.it  This test is not yet approved or cleared by the [place] FDA and has been authorized for detection and/or diagnosis of SARS-CoV-2 by FDA under an Emergency Use Authorization (EUA). This EUA will remain in effect (meaning this test can be used) for the duration of the COVID-19 declaration under Section 564(b)(1) of the Act, 21 U.S.C. section 360bbb-3(b)(1), unless the authorization is terminated  or revoked.  Performed at [facility], [address], [place], [place] [address]   Group A Strep by PCR     Status: None   Collection Time: 06/02/23  5:24 PM   Specimen: Anterior Nasal Swab; Sterile Swab  Result Value Ref Range Status   Group A Strep by PCR NOT DETECTED NOT DETECTED Final    Comment: Performed at [facility], [address], [place], [place] [address]   Labs: CBC: Recent Labs  Lab 06/02/23 1842 06/02/23 2018  WBC 7.8  --   NEUTROABS 5.7  --   HGB 16.1 16.7  HCT 47.3 49.0  MCV 86.8  --   PLT 196  --    Basic Metabolic Panel: Recent Labs  Lab 06/02/23 1842 06/02/23 2018 06/02/23 2140 06/03/23 0026 06/03/23 0500 06/03/23 1020  NA 130* 128* 133* 137 139 140  K 5.3* 5.8* 5.8* 4.2 3.4* 3.9  CL 89*  --  94* 98 101 99  CO2 22  --  22 25 30 29   GLUCOSE 754*  --  630* 387* 247* 209*  BUN 33*  --  31* 29* 26* 23*  CREATININE 2.46*  --  2.31* 2.01* 1.81* 1.69*  CALCIUM 10.0  --  9.2 9.4  9.1 9.4  MG  --   --   --  2.4 2.6*  --   PHOS  --   --   --  1.8* 2.5  --    Liver Function Tests: Recent Labs  Lab 06/02/23 1842  AST 18  ALT 16  ALKPHOS 92  BILITOT 0.6  PROT 8.3*  ALBUMIN 4.1   CBG: Recent Labs  Lab 06/03/23 0847 06/03/23 0947 06/03/23 1033 06/03/23 1154 06/03/23 1243  GLUCAP 191* 212* 214* 194* 190*    Discharge time spent: greater than 30 minutes.  Author: Lynden Oxford, MD  [facility] 06/03/2023

## 2023-08-07 ENCOUNTER — Emergency Department (HOSPITAL_BASED_OUTPATIENT_CLINIC_OR_DEPARTMENT_OTHER): Payer: Self-pay

## 2023-08-07 ENCOUNTER — Inpatient Hospital Stay (HOSPITAL_BASED_OUTPATIENT_CLINIC_OR_DEPARTMENT_OTHER)
Admission: EM | Admit: 2023-08-07 | Discharge: 2023-08-13 | DRG: 871 | Disposition: A | Discharge status: Home / Self Care | Payer: Self-pay | Attending: Internal Medicine | Admitting: Internal Medicine

## 2023-08-07 ENCOUNTER — Encounter (HOSPITAL_BASED_OUTPATIENT_CLINIC_OR_DEPARTMENT_OTHER): Payer: Self-pay | Admitting: Internal Medicine

## 2023-08-07 ENCOUNTER — Other Ambulatory Visit: Payer: Self-pay

## 2023-08-07 DIAGNOSIS — Z1152 Encounter for screening for COVID-19: Secondary | ICD-10-CM

## 2023-08-07 DIAGNOSIS — N170 Acute kidney failure with tubular necrosis: Secondary | ICD-10-CM | POA: Diagnosis present

## 2023-08-07 DIAGNOSIS — E876 Hypokalemia: Secondary | ICD-10-CM | POA: Diagnosis present

## 2023-08-07 DIAGNOSIS — E875 Hyperkalemia: Secondary | ICD-10-CM | POA: Diagnosis present

## 2023-08-07 DIAGNOSIS — N1832 Chronic kidney disease, stage 3b: Secondary | ICD-10-CM | POA: Diagnosis present

## 2023-08-07 DIAGNOSIS — Z91013 Allergy to seafood: Secondary | ICD-10-CM

## 2023-08-07 DIAGNOSIS — R739 Hyperglycemia, unspecified: Secondary | ICD-10-CM

## 2023-08-07 DIAGNOSIS — Z79899 Other long term (current) drug therapy: Secondary | ICD-10-CM

## 2023-08-07 DIAGNOSIS — E785 Hyperlipidemia, unspecified: Secondary | ICD-10-CM | POA: Diagnosis present

## 2023-08-07 DIAGNOSIS — Z7984 Long term (current) use of oral hypoglycemic drugs: Secondary | ICD-10-CM

## 2023-08-07 DIAGNOSIS — J9601 Acute respiratory failure with hypoxia: Secondary | ICD-10-CM | POA: Diagnosis present

## 2023-08-07 DIAGNOSIS — E1122 Type 2 diabetes mellitus with diabetic chronic kidney disease: Secondary | ICD-10-CM | POA: Diagnosis present

## 2023-08-07 DIAGNOSIS — R579 Shock, unspecified: Secondary | ICD-10-CM

## 2023-08-07 DIAGNOSIS — Z833 Family history of diabetes mellitus: Secondary | ICD-10-CM

## 2023-08-07 DIAGNOSIS — I129 Hypertensive chronic kidney disease with stage 1 through stage 4 chronic kidney disease, or unspecified chronic kidney disease: Secondary | ICD-10-CM | POA: Diagnosis present

## 2023-08-07 DIAGNOSIS — D696 Thrombocytopenia, unspecified: Secondary | ICD-10-CM | POA: Diagnosis present

## 2023-08-07 DIAGNOSIS — E1110 Type 2 diabetes mellitus with ketoacidosis without coma: Principal | ICD-10-CM

## 2023-08-07 DIAGNOSIS — Z789 Other specified health status: Secondary | ICD-10-CM

## 2023-08-07 DIAGNOSIS — Z794 Long term (current) use of insulin: Secondary | ICD-10-CM

## 2023-08-07 DIAGNOSIS — J15211 Pneumonia due to Methicillin susceptible Staphylococcus aureus: Secondary | ICD-10-CM

## 2023-08-07 DIAGNOSIS — A4101 Sepsis due to Methicillin susceptible Staphylococcus aureus: Principal | ICD-10-CM

## 2023-08-07 DIAGNOSIS — R6521 Severe sepsis with septic shock: Secondary | ICD-10-CM | POA: Diagnosis present

## 2023-08-07 DIAGNOSIS — A419 Sepsis, unspecified organism: Secondary | ICD-10-CM | POA: Diagnosis present

## 2023-08-07 DIAGNOSIS — J101 Influenza due to other identified influenza virus with other respiratory manifestations: Secondary | ICD-10-CM

## 2023-08-07 DIAGNOSIS — Z6841 Body Mass Index (BMI) 40.0 and over, adult: Secondary | ICD-10-CM

## 2023-08-07 DIAGNOSIS — J1008 Influenza due to other identified influenza virus with other specified pneumonia: Secondary | ICD-10-CM | POA: Diagnosis present

## 2023-08-07 LAB — I-STAT VENOUS BLOOD GAS, ED
Acid-base deficit: 21 mmol/L — ABNORMAL HIGH (ref 0.0–2.0)
Acid-base deficit: 18 mmol/L — ABNORMAL HIGH (ref 0.0–2.0)
Acid-base deficit: 15 mmol/L — ABNORMAL HIGH (ref 0.0–2.0)
Bicarbonate: 8.7 mmol/L — ABNORMAL LOW (ref 20.0–28.0)
Bicarbonate: 10.6 mmol/L — ABNORMAL LOW (ref 20.0–28.0)
Bicarbonate: 10.1 mmol/L — ABNORMAL LOW (ref 20.0–28.0)
Calcium, Ion: 1.3 mmol/L (ref 1.15–1.40)
Calcium, Ion: 1.3 mmol/L (ref 1.15–1.40)
Calcium, Ion: 1.26 mmol/L (ref 1.15–1.40)
HCT: 55 % — ABNORMAL HIGH (ref 39.0–52.0)
HCT: 48 % (ref 39.0–52.0)
HCT: 49 % (ref 39.0–52.0)
Hemoglobin: 18.7 g/dL — ABNORMAL HIGH (ref 13.0–17.0)
Hemoglobin: 16.3 g/dL (ref 13.0–17.0)
Hemoglobin: 16.7 g/dL (ref 13.0–17.0)
O2 Saturation: 54 %
O2 Saturation: 44 %
O2 Saturation: 58 %
Patient temperature: 98.9
Patient temperature: 98.7
Potassium: 6.3 mmol/L (ref 3.5–5.1)
Potassium: 5.1 mmol/L (ref 3.5–5.1)
Potassium: 5.6 mmol/L — ABNORMAL HIGH (ref 3.5–5.1)
Sodium: 133 mmol/L — ABNORMAL LOW (ref 135–145)
Sodium: 139 mmol/L (ref 135–145)
Sodium: 137 mmol/L (ref 135–145)
TCO2: 10 mmol/L — ABNORMAL LOW (ref 22–32)
TCO2: 12 mmol/L — ABNORMAL LOW (ref 22–32)
TCO2: 11 mmol/L — ABNORMAL LOW (ref 22–32)
pCO2, Ven: 32.1 mm[Hg] — ABNORMAL LOW (ref 44–60)
pCO2, Ven: 32.5 mm[Hg] — ABNORMAL LOW (ref 44–60)
pCO2, Ven: 22.9 mm[Hg] — ABNORMAL LOW (ref 44–60)
pH, Ven: 7.042 — CL (ref 7.25–7.43)
pH, Ven: 7.122 — CL (ref 7.25–7.43)
pH, Ven: 7.25 (ref 7.25–7.43)
pO2, Ven: 40 mm[Hg] (ref 32–45)
pO2, Ven: 32 mm[Hg] (ref 32–45)
pO2, Ven: 34 mm[Hg] (ref 32–45)

## 2023-08-07 LAB — CBG MONITORING, ED
Glucose-Capillary: 542 mg/dL (ref 70–99)
Glucose-Capillary: 570 mg/dL (ref 70–99)
Glucose-Capillary: 570 mg/dL (ref 70–99)
Glucose-Capillary: 464 mg/dL — ABNORMAL HIGH (ref 70–99)
Glucose-Capillary: 445 mg/dL — ABNORMAL HIGH (ref 70–99)
Glucose-Capillary: 390 mg/dL — ABNORMAL HIGH (ref 70–99)
Glucose-Capillary: 362 mg/dL — ABNORMAL HIGH (ref 70–99)
Glucose-Capillary: 416 mg/dL — ABNORMAL HIGH (ref 70–99)
Glucose-Capillary: 360 mg/dL — ABNORMAL HIGH (ref 70–99)
Glucose-Capillary: 352 mg/dL — ABNORMAL HIGH (ref 70–99)
Glucose-Capillary: 295 mg/dL — ABNORMAL HIGH (ref 70–99)
Glucose-Capillary: 278 mg/dL — ABNORMAL HIGH (ref 70–99)

## 2023-08-07 LAB — BASIC METABOLIC PANEL
Anion gap: 35 — ABNORMAL HIGH (ref 5–15)
Anion gap: 32 — ABNORMAL HIGH (ref 5–15)
Anion gap: 20 — ABNORMAL HIGH (ref 5–15)
BUN: 20 mg/dL (ref 6–20)
BUN: 19 mg/dL (ref 6–20)
BUN: 19 mg/dL (ref 6–20)
CO2: 8 mmol/L — ABNORMAL LOW (ref 22–32)
CO2: 9 mmol/L — ABNORMAL LOW (ref 22–32)
CO2: 11 mmol/L — ABNORMAL LOW (ref 22–32)
Calcium: 10.8 mg/dL — ABNORMAL HIGH (ref 8.9–10.3)
Calcium: 10.1 mg/dL (ref 8.9–10.3)
Calcium: 9.4 mg/dL (ref 8.9–10.3)
Chloride: 92 mmol/L — ABNORMAL LOW (ref 98–111)
Chloride: 99 mmol/L (ref 98–111)
Chloride: 105 mmol/L (ref 98–111)
Creatinine, Ser: 2.7 mg/dL — ABNORMAL HIGH (ref 0.61–1.24)
Creatinine, Ser: 2.34 mg/dL — ABNORMAL HIGH (ref 0.61–1.24)
Creatinine, Ser: 2.47 mg/dL — ABNORMAL HIGH (ref 0.61–1.24)
GFR, Estimated: 29 mL/min — ABNORMAL LOW (ref >60)
GFR, Estimated: 35 mL/min — ABNORMAL LOW (ref >60)
GFR, Estimated: 33 mL/min — ABNORMAL LOW (ref >60)
Glucose, Bld: 608 mg/dL (ref 70–99)
Glucose, Bld: 469 mg/dL — ABNORMAL HIGH (ref 70–99)
Glucose, Bld: 362 mg/dL — ABNORMAL HIGH (ref 70–99)
Potassium: 6.1 mmol/L — ABNORMAL HIGH (ref 3.5–5.1)
Potassium: 5 mmol/L (ref 3.5–5.1)
Potassium: 4.7 mmol/L (ref 3.5–5.1)
Sodium: 135 mmol/L (ref 135–145)
Sodium: 140 mmol/L (ref 135–145)
Sodium: 136 mmol/L (ref 135–145)

## 2023-08-07 LAB — URINALYSIS, ROUTINE W REFLEX MICROSCOPIC
Bacteria, UA: NONE SEEN
Bilirubin Urine: NEGATIVE
Glucose, UA: >1000 mg/dL — AB
Ketones, ur: >80 mg/dL — AB
Leukocytes,Ua: NEGATIVE
Nitrite: NEGATIVE
Protein, ur: 30 mg/dL — AB
Specific Gravity, Urine: 1.023 (ref 1.005–1.030)
pH: 5.5 (ref 5.0–8.0)

## 2023-08-07 LAB — OSMOLALITY: Osmolality: 358 mosm/kg (ref 275–295)

## 2023-08-07 LAB — CBC
HCT: 55.2 % — ABNORMAL HIGH (ref 39.0–52.0)
Hemoglobin: 17.5 g/dL — ABNORMAL HIGH (ref 13.0–17.0)
MCH: 29.9 pg (ref 26.0–34.0)
MCHC: 31.7 g/dL (ref 30.0–36.0)
MCV: 94.4 fL (ref 80.0–100.0)
Platelets: 227 10*3/uL (ref 150–400)
RBC: 5.85 MIL/uL — ABNORMAL HIGH (ref 4.22–5.81)
RDW: 13.7 % (ref 11.5–15.5)
WBC: 12.2 10*3/uL — ABNORMAL HIGH (ref 4.0–10.5)
nRBC: 0 % (ref 0.0–0.2)

## 2023-08-07 LAB — BETA-HYDROXYBUTYRIC ACID: Beta-Hydroxybutyric Acid: >8 mmol/L — ABNORMAL HIGH (ref 0.05–0.27)

## 2023-08-07 LAB — RESP PANEL BY RT-PCR (RSV, FLU A&B, COVID)  RVPGX2
Influenza A by PCR: POSITIVE — AB
Influenza B by PCR: NEGATIVE
Resp Syncytial Virus by PCR: NEGATIVE
SARS Coronavirus 2 by RT PCR: NEGATIVE

## 2023-08-07 LAB — LACTIC ACID, PLASMA: Lactic Acid, Venous: 4.7 mmol/L (ref 0.5–1.9)

## 2023-08-07 MED ORDER — CHLORHEXIDINE GLUCONATE CLOTH 2 % EX PADS
6.0000 | MEDICATED_PAD | Freq: Every day | CUTANEOUS | Status: DC
  Administered 2023-08-08 – 2023-08-13 (×6): 6 via TOPICAL

## 2023-08-07 MED ORDER — VANCOMYCIN HCL 1500 MG/300ML IV SOLN
1500.0000 mg | INTRAVENOUS | Status: DC
  Filled 2023-08-07: qty 300

## 2023-08-07 MED ORDER — SODIUM CHLORIDE 0.9 % IV SOLN
2.0000 g | Freq: Once | INTRAVENOUS | Status: AC
  Administered 2023-08-07: 2 g via INTRAVENOUS
  Filled 2023-08-07: qty 20

## 2023-08-07 MED ORDER — POTASSIUM CHLORIDE 10 MEQ/100ML IV SOLN
10.0000 meq | INTRAVENOUS | Status: AC
  Administered 2023-08-07 (×2): 10 meq via INTRAVENOUS
  Filled 2023-08-07 (×2): qty 100

## 2023-08-07 MED ORDER — LACTATED RINGERS IV SOLN: INTRAVENOUS | Status: AC

## 2023-08-07 MED ORDER — SODIUM CHLORIDE 0.9 % IV SOLN
250.0000 mL | INTRAVENOUS | Status: AC
  Administered 2023-08-07: 250 mL via INTRAVENOUS

## 2023-08-07 MED ORDER — ORAL CARE MOUTH RINSE
15.0000 mL | OROMUCOSAL | Status: DC
  Administered 2023-08-08 – 2023-08-11 (×12): 15 mL via OROMUCOSAL

## 2023-08-07 MED ORDER — LACTATED RINGERS IV BOLUS
1000.0000 mL | Freq: Once | INTRAVENOUS | Status: AC
  Administered 2023-08-07: 1000 mL via INTRAVENOUS

## 2023-08-07 MED ORDER — INSULIN REGULAR(HUMAN) IN NACL 100-0.9 UT/100ML-% IV SOLN
INTRAVENOUS | Status: DC
  Administered 2023-08-07: 10.3 [IU]/h via INTRAVENOUS
  Administered 2023-08-08: 28 [IU]/h via INTRAVENOUS
  Administered 2023-08-08: 27 [IU]/h via INTRAVENOUS
  Filled 2023-08-07 (×4): qty 100

## 2023-08-07 MED ORDER — VANCOMYCIN HCL IN DEXTROSE 1-5 GM/200ML-% IV SOLN
1000.0000 mg | Freq: Once | INTRAVENOUS | Status: AC
  Administered 2023-08-08: 1000 mg via INTRAVENOUS
  Filled 2023-08-07: qty 200

## 2023-08-07 MED ORDER — SODIUM CHLORIDE 0.9 % IV SOLN: INTRAVENOUS | Status: AC | PRN

## 2023-08-07 MED ORDER — ORAL CARE MOUTH RINSE: 15.0000 mL | OROMUCOSAL | Status: DC | PRN

## 2023-08-07 MED ORDER — LACTATED RINGERS IV BOLUS
20.0000 mL/kg | Freq: Once | INTRAVENOUS | Status: AC
  Administered 2023-08-07: 2922 mL via INTRAVENOUS

## 2023-08-07 MED ORDER — LIDOCAINE HCL (PF) 1 % IJ SOLN
INTRAMUSCULAR | Status: AC
  Administered 2023-08-08: 5 mL
  Filled 2023-08-07: qty 5

## 2023-08-07 MED ORDER — ONDANSETRON HCL 4 MG/2ML IJ SOLN
4.0000 mg | Freq: Once | INTRAMUSCULAR | Status: AC
  Administered 2023-08-07: 4 mg via INTRAVENOUS
  Filled 2023-08-07: qty 2

## 2023-08-07 MED ORDER — NOREPINEPHRINE 4 MG/250ML-% IV SOLN
2.0000 ug/min | INTRAVENOUS | Status: DC
  Administered 2023-08-07: 2 ug/min via INTRAVENOUS
  Administered 2023-08-08: 18 ug/min via INTRAVENOUS
  Filled 2023-08-07 (×2): qty 250

## 2023-08-07 NOTE — ED Notes (Signed)
Per EDP Lawsing pt change POC. Pt now ED to ED at [facility] Accepting provider Messick. Report given to Charge RN Fleet Contras at [facility] ED.

## 2023-08-07 NOTE — Progress Notes (Signed)
   PCCM transfer request    Sending physician: [name]  Sending facility: [facility]  Reason for transfer: respiratory failure  Brief case summary: Flu, DKA, got 4L IVF then developed respiratory failure, bilateral infiltrates. IVF stopped, on bipap. Still on insulin  gtt. Soft Bps.  Recommendations made prior to transfer: Start antibiotics, check LA   Transfer accepted: yes; may need ED to ED transfer if he is deteriorating and we don't have beds    Mark Schneider 08/07/23 9:13 PM [facility]  For contact information, see Amion. If no response to pager, please call PCCM consult pager. After hours, 7PM- 7AM, please call Elink.

## 2023-08-07 NOTE — H&P (Signed)
 NAME:  Mark Schneider, MRN:  [id number], DOB:  06-19-1981, LOS: 0 ADMISSION DATE:  08/07/2023, CONSULTATION DATE:  2/4 REFERRING MD:  Lawsing-ED, CHIEF COMPLAINT:  respiratory failure   History of Present Illness:  Mark Schneider is a 43 y/o gentleman with a history of DM who presented to the ED with nausea, vomiting, SOB, myalgias, and sore throat since 1/31. He was found to have DKA and was volume resuscitated started on an insulin  infusion. After about 4L  IVF he developed respiratory failure and required bipap. IVF were stopped, He developed hypotension requiring vasopressors to be started. Flu A +. He was transferred to [facility] for admission.   Pertinent  Medical History  DM2 Obesity HTN   Significant Hospital Events: Including procedures, antibiotic start and stop dates in addition to other pertinent events   2/4 admitted  Interim History / Subjective:    Objective   Blood pressure 99/60, pulse (!) 124, temperature 98.8 F (37.1 C), temperature source Oral, resp. rate (!) 32, height 5' 10.5 (1.791 m), weight (!) 146.1 kg, SpO2 97%.    FiO2 (%):  [50 %] 50 % Pressure Support:  [5 cmH20] 5 cmH20   Intake/Output Summary (Last 24 hours) at 08/07/2023 2229 Last data filed at 08/07/2023 2211 Gross per 24 hour  Intake 4311.46 ml  Output --  Net 4311.46 ml   Filed Weights   08/07/23 1042  Weight: (!) 146.1 kg    Examination: General: ill appearing man lying in bed in NAD HENT: [place]/AT, bipap mask in place Lungs: rhales bilaterally, tachypnea without accessory muscle use Cardiovascular: S1S2, tachycardic, reg rhythm Abdomen: obese, soft, NT Extremities: no edema, no cyanosis Neuro: awake, moving all extremities, answering questions appropriately Derm: warm, dry, no rashes  CXR personally reviewed> patchy opacities bilaterally 7.25/23/34/10 Bicarb 11, AG 20 BUN 19 Cr 2.47 B-Oh-butyrate >8, BG >600 LA 4.7 WBC 12.2 H/H 17.5/55.2 Platelets 227   Resolved Hospital Problem  list     Assessment & Plan:   Acute respiratory failure due to Flu pneumonia, concern for superimposed bacterial pneumonia -tamiflu ; questionable efficacy given duration of symptoms -agree with empiric broad spectrum antibiotics for CAP + MRSA coverage -check D-dimer before CTA; with degree of airspace disease this is likely to explain his severity of hypoxia  Shock; concern for septic etiology Lactic acidosis -con't antibiotics -echo -vasopressors to maintain MAP >65 -cultures collected on arrival -CVC, Aline  DKA, precipitated by infection -con't insulin  infusion per endotool -volume resuscitated with 4L ; limit additional IVF with severity of respiratory failure -check A1c  -hold PTA metformin  & 70/30 insulin  -serial BMPs  AKI on CKD 2; recent baseline Cr ~1.7 Hyperkalemia due to DKA resolved -volume resuscitated -maintain adequate perfusion -strict I/O -renally dose meds, avoid nephrotoxic meds  H/o HTN -hold PTA amlodipine      Best Practice (right click and Reselect all SmartList Selections daily)   Diet/type: NPO DVT prophylaxis LMWH Pressure ulcer(s): N/A GI prophylaxis: N/A Lines: Central line and Arterial Line Foley:  N/A Code Status:  full code Last date of multidisciplinary goals of care discussion [ ]   Labs   CBC: Recent Labs  Lab 08/07/23 1035 08/07/23 1526 08/07/23 1935  WBC 12.2*  --   --   HGB 17.5*  18.7* 16.3 16.7  HCT 55.2*  55.0* 48.0 49.0  MCV 94.4  --   --   PLT 227  --   --     Basic Metabolic Panel: Recent Labs  Lab 08/07/23 1035  08/07/23 1353 08/07/23 1526 08/07/23 1935 08/07/23 2025  NA 135  133* 140 139 137 136  K 6.1*  6.3* 5.0 5.1 5.6* 4.7  CL 92* 99  --   --  105  CO2 8* 9*  --   --  11*  GLUCOSE 608* 469*  --   --  362*  BUN 20 19  --   --  19  CREATININE 2.70* 2.34*  --   --  2.47*  CALCIUM  10.8* 10.1  --   --  9.4   GFR: Estimated Creatinine Clearance: 57.3 mL/min (A) (by C-G formula based on SCr  of 2.47 mg/dL (H)). Recent Labs  Lab 08/07/23 1035 08/07/23 2127  WBC 12.2*  --   LATICACIDVEN  --  4.7*    Liver Function Tests: No results for input(s): AST, ALT, ALKPHOS, BILITOT, PROT, ALBUMIN in the last 168 hours. No results for input(s): LIPASE, AMYLASE in the last 168 hours. No results for input(s): AMMONIA in the last 168 hours.  ABG    Component Value Date/Time   HCO3 10.1 (L) 08/07/2023 1935   TCO2 11 (L) 08/07/2023 1935   ACIDBASEDEF 15.0 (H) 08/07/2023 1935   O2SAT 58 08/07/2023 1935     Coagulation Profile: No results for input(s): INR, PROTIME in the last 168 hours.  Cardiac Enzymes: No results for input(s): CKTOTAL, CKMB, CKMBINDEX, TROPONINI in the last 168 hours.  HbA1C: Hemoglobin A1C  Date/Time Value Ref Range Status  10/09/2022 02:55 PM 6.0 (A) 4.0 - 5.6 % Final   Hgb A1c MFr Bld  Date/Time Value Ref Range Status  06/03/2023 12:26 AM 14.1 (H) 4.8 - 5.6 % Final    Comment:    (NOTE) Pre diabetes:          5.7%-6.4%  Diabetes:              >6.4%  Glycemic control for   <7.0% adults with diabetes   07/31/2022 04:39 PM 6.3 (H) 4.8 - 5.6 % Final    Comment:             Prediabetes: 5.7 - 6.4          Diabetes: >6.4          Glycemic control for adults with diabetes: <7.0     CBG: Recent Labs  Lab 08/07/23 1646 08/07/23 1803 08/07/23 1917 08/07/23 2032 08/07/23 2130  GLUCAP 362* 416* 360* 352* 295*    Review of Systems:   Limited due to respiratory failure  Past Medical History:  He,  has a past medical history of Acute renal failure (ARF) (HCC) (11/15/2017), DM (diabetes mellitus) (HCC), and Hypertension.   Surgical History:   Past Surgical History:  Procedure Laterality Date   DENTAL SURGERY     TOE SURGERY       Social History:   reports that he has never smoked. He has never used smokeless tobacco. He reports that he does not drink alcohol and does not use drugs.   Family History:  His  family history includes Diabetes in his maternal grandfather, maternal grandmother, maternal uncle, mother, and sister.   Allergies Allergies  Allergen Reactions   Sheep-Derived Products Swelling   Shellfish Allergy Swelling     Home Medications  Prior to Admission medications   Medication Sig Start Date End Date Taking? Authorizing Provider  amLODipine  (NORVASC ) 10 MG tablet Take 1 tablet (10 mg total) by mouth daily. 06/03/23  Yes Tobie Yetta HERO, MD  insulin  isophane &  regular human KwikPen (NOVOLIN  70/30 KWIKPEN) (70-30) 100 UNIT/ML KwikPen Inject 25 Units into the skin as directed. Take 10 unit on 06/03/2023, take 25 units 2 times daily with meals starting 06/04/2023 06/03/23  Yes Tobie Yetta HERO, MD  metFORMIN  (GLUCOPHAGE ) 1000 MG tablet Take 1 tablet (1,000 mg total) by mouth 2 (two) times daily with a meal. 06/03/23  Yes Tobie Yetta HERO, MD  rosuvastatin  (CRESTOR ) 10 MG tablet Take 1 tablet (10 mg total) by mouth daily. 06/03/23  Yes Tobie Yetta HERO, MD  Blood Glucose Monitoring Suppl DEVI 1 each by Does not apply route 3 (three) times daily. May dispense any manufacturer covered by patient's insurance. 06/03/23   Tobie Yetta HERO, MD  Glucose Blood (BLOOD GLUCOSE TEST STRIPS) STRP 1 each by Does not apply route 3 (three) times daily. Use as directed to check blood sugar. May dispense any manufacturer covered by patient's insurance and fits patient's device. 06/03/23   Tobie Yetta HERO, MD  Insulin  Pen Needle (PEN NEEDLES) 31G X 5 MM MISC 1 each by Does not apply route 3 (three) times daily. May dispense any manufacturer covered by patient's insurance. 06/03/23   Tobie Yetta HERO, MD  Lancet Device MISC 1 each by Does not apply route 3 (three) times daily. May dispense any manufacturer covered by patient's insurance. 06/03/23   Tobie Yetta HERO, MD  Lancets MISC 1 each by Does not apply route 3 (three) times daily. Use as directed to check blood sugar. May dispense any manufacturer covered by patient's  insurance and fits patient's device. 06/03/23   Tobie Yetta HERO, MD     Critical care time: 45 min.    Leita SHAUNNA Gaskins, DO 08/08/23 12:29 AM [facility]  For contact information, see Amion. If no response to pager, please call PCCM consult pager. After hours, 7PM- 7AM, please call Elink.

## 2023-08-07 NOTE — ED Notes (Signed)
Critical VBG results given to EDP. No respiratory orders given at this time.

## 2023-08-07 NOTE — ED Notes (Signed)
FSBS 570. Endotool in downtime from 1100-1300. Paged inpatient DM coordinator for instructions. Spoke with Beryl Meager. She will email downtime starting guide.

## 2023-08-07 NOTE — ED Provider Notes (Addendum)
 Physical Exam  BP (!) 143/90   Pulse (!) 125   Temp 98.3 F (36.8 C) (Oral)   Resp (!) 31   Ht 5' 10.5 (1.791 m)   Wt (!) 146.1 kg   SpO2 94%   BMI 45.55 kg/m   Physical Exam  Procedures  .Critical Care  Performed by: [name] Agent, MD Authorized by: [name] Agent, MD   Critical care provider statement:    Critical care time (minutes):  65   Critical care was time spent personally by me on the following activities:  Development of treatment plan with patient or surrogate, discussions with consultants, evaluation of patient's response to treatment, examination of patient, ordering and review of laboratory studies, ordering and review of radiographic studies, ordering and performing treatments and interventions, pulse oximetry, re-evaluation of patient's condition and review of old charts   ED Course / MDM   Clinical Course as of 08/07/23 1925  Tue Aug 07, 2023  1051 Initial blood glucose 542.  Point-of-care VBG notable for acidosis of 7.04.  Potassium 6.3.  Influenza A positive.  Consistent with DKA.  Positive for influenza A [MP]  1527 Slight improvement in pH and bicarb after IV fluids and initiation of insulin  drip.  Repeat potassium was 5.0.  Will provide potassium repletion with 10 mill equivalents in 100 mL.  Patient remains tachycardic and it is likely he is still very volume down.  Will continue IV fluids.  Discussed with admitting hospitalist accepts patient for admission [MP]    Clinical Course User Index [MP] Pamella Ozell LABOR, DO   Medical Decision Making Amount and/or Complexity of Data Reviewed Labs: ordered. Radiology: ordered.  Risk Prescription drug management. Decision regarding hospitalization.   67M presenting in DKA, Flu positive. Needs admission.  Pt was admitted to the hospitalist service.  While in the emergency department, he acutely worsened from a respiratory standpoint.  He remains tachycardic, O2 sats were hovering at 90%.  He has Rales  bilaterally on exam after 4 L of fluid resuscitation.  A stat chest x-ray was obtained and a repeat VBG was obtained.  The patient is also notably flu positive.  A chest x-ray was performed which revealed a new consolidation concerning for aspiration or pneumonia.  The patient does have a leukocytosis, he is in respiratory distress with downtrending blood pressures.  He was started on BiPAP.  His repeat VBG revealed improvement in his pH to 7.25 from 7.12 earlier, his blood glucose is trending down.  He does have influenza however with his chest x-ray findings, concern for developing sepsis, blood cultures were ordered and pending, the patient was covered with antibiotics to cover for CAP with Rocephin  and vancomycin  and given the downtrending blood pressures with maps in the 50s, the patient was started on Levophed .  The patient was escalated to the ICU given his decompensation, Dr. Gretta accepting.  Family and the patient were updated bedside regarding the plan of care.  The patient was tolerating BiPAP maintaining oxygen saturations, persistently tachypneic in the 30s.  A CT angiogram was obtained to rule out pulmonary embolism. His lactic was checked and was over 4. A code sepsis was called.   Discussed with Dr. Gretta as the patient had escalating Levophed  requirements.  She would prefer that the patient be transported to ER to ER to [facility] for evaluation in the event of any need for further escalation of care.  Dr. Laurice accepted the patient in ER to ER transfer with the ultimate goal  of ICU admission.  Patient and family updated regarding the plan of care.  Patient's blood pressure began to improve on 15 mcg/min of Levophed . He was then admitted to a bed at the [facility] ICU in critical but stabilized condition.      [name] Agent, MD 08/07/23 [id number]        [name] Agent, MD 08/08/23 [phone]

## 2023-08-07 NOTE — Progress Notes (Signed)
 Pharmacy Antibiotic Note  Mark Schneider is a 43 y.o. male admitted on 08/07/2023 with pneumonia.  Pharmacy has been consulted for vancomycin  dosing.  Imaging shows bilateral infiltrates, patient already received ceftriaxone  2g x1 per CCM team. WBC 12.2, afebrile, Scr 2.47 (elevated above baseline). Influenza A positive.  Plan: Vancomycin  load 2000 mg IV x1 Vancomycin  1500 IV every 24 hours (Scr 2.47, Vd 0.5, eAUC 531) Goal trough 10-15 mcg/mL Monitor clinical status and culture data Scr is slightly above baseline, may need to adjust dosing if Scr continues to improve  Height: 5' 10.5 (179.1 cm) Weight: (!) 146.1 kg (322 lb) IBW/kg (Calculated) : 74.15  Temp (24hrs), Avg:98.7 F (37.1 C), Min:98.3 F (36.8 C), Max:98.9 F (37.2 C)  Recent Labs  Lab 08/07/23 1035 08/07/23 1353 08/07/23 2025  WBC 12.2*  --   --   CREATININE 2.70* 2.34* 2.47*    Estimated Creatinine Clearance: 57.3 mL/min (A) (by C-G formula based on SCr of 2.47 mg/dL (H)).    Allergies  Allergen Reactions   Sheep-Derived Products Swelling   Shellfish Allergy Swelling    Antimicrobials this admission: Ceftriaxone  2/4 >>  vancomycin  2/4 >>   Dose adjustments this admission: N/a  Microbiology results: 2/4 BCx: sent 2/4 Resp panel: influenza A +   Thank you for allowing pharmacy to be a part of this patient's care.  Nidia Schaffer, PharmD PGY1 Pharmacy Resident  Please check AMION for all [facility] Pharmacy phone numbers After 10:00 PM, call Main Pharmacy [phone] 08/07/2023 9:26 PM

## 2023-08-07 NOTE — ED Notes (Signed)
Warm blankets provided. VS updated. LR and Insulin drip infusing. 400 mL dark yellow UOP. UA sent to lab.

## 2023-08-07 NOTE — ED Triage Notes (Addendum)
N/V, SOB since Friday. Also reports sore throat and body aches.

## 2023-08-07 NOTE — Inpatient Diabetes Management (Signed)
Inpatient Diabetes Program Recommendations  AACE/ADA: New Consensus Statement on Inpatient Glycemic Control   Target Ranges:  Prepandial:   less than 140 mg/dL      Peak postprandial:   less than 180 mg/dL (1-2 hours)      Critically ill patients:  140 - 180 mg/dL    Latest Reference Range & Units 08/07/23 10:35  CO2 22 - 32 mmol/L 8 (L)  Glucose 70 - 99 mg/dL 308 (HH)  BUN 6 - 20 mg/dL 20  Creatinine 6.57 - 8.46 mg/dL 9.62 (H)  Calcium 8.9 - 10.3 mg/dL 95.2 (H)  Anion gap 5 - 15  35 (H)    Latest Reference Range & Units 08/07/23 10:35  Beta-Hydroxybutyric Acid 0.05 - 0.27 mmol/L >8.00 (H)    Latest Reference Range & Units 10/09/22 14:55 06/03/23 00:26  Hemoglobin A1C 4.8 - 5.6 % 6.0 ! 14.1 (H)    Review of Glycemic Control  Diabetes history: DM2 Outpatient Diabetes medications: Novolin 70/30 25 units BID with meals, Metformin 1000 mg BID Current orders for Inpatient glycemic control: IV insulin  Inpatient Diabetes Program Recommendations:    Insulin: IV insulin should be continued until acidosis has completely resolved (CO2 >18 and beta-hydroxybutyric <0.5).  HbgA1C:  A1C 14.1% on 06/03/23 indicating an average glucose of 358 mg/dl.   NOTE: Patient currently at [facility] with DKA and has been started on IV insulin. IV insulin should be continued until acidosis has completely resolved. In reviewing chart, noted patient was recently inpatient 06/02/23-06/03/23 with HHS. Per H&P on 06/02/23, patient had only been taking Metformin but had Jardiance 10 mg daily, Glipizide 5 mg BID, and Metformin 1000 mg BID, and Ozempic 0.25 mg Qweek listed as DM medication prescribed prior to admission. Inpatient diabetes coordinator talked with patient on 06/03/23 and it was noted that patient was not able to afford DM medications.  Per discharge summary on 06/03/23, patient was discharged on Novolin 70/30 25 units BID and Metformin 1000 mg BID.   Thanks, Orlando Penner, RN, MSN, CDCES Diabetes  Coordinator Inpatient Diabetes Program [phone] (Team Pager from 8am to 5pm)

## 2023-08-07 NOTE — Progress Notes (Addendum)
Elink monitoring for the code sepsis protocol.  2347 Notified bedside nurse of need to draw repeat lactic acid and blood cultures.

## 2023-08-07 NOTE — ED Notes (Signed)
Report given to RT at [facility].

## 2023-08-07 NOTE — ED Provider Notes (Signed)
 [place] EMERGENCY DEPARTMENT AT [facility] Provider Note   CSN: [id number] Arrival date & time: 08/07/23  9047     History  Chief Complaint  Patient presents with   Shortness of Breath   Hyperglycemia    Mark Schneider is a 43 y.o. male.  With a history of type 2 diabetes who presents to the ED for nausea and vomiting.  Symptoms of general malaise, body aches and nausea vomiting began 4 days ago and have persisted since the onset.  He reports 4-5 episodes of vomiting yesterday.  No abdominal pain or change in bowel habits.  Reports compliance with metformin  at home.  Denies chest pain shortness of breath.  No prior history of DKA   Shortness of Breath Hyperglycemia Associated symptoms: shortness of breath        Home Medications Prior to Admission medications   Medication Sig Start Date End Date Taking? Authorizing Provider  amLODipine  (NORVASC ) 10 MG tablet Take 1 tablet (10 mg total) by mouth daily. 06/03/23  Yes Tobie Yetta HERO, MD  insulin  isophane & regular human KwikPen (NOVOLIN  70/30 KWIKPEN) (70-30) 100 UNIT/ML KwikPen Inject 25 Units into the skin as directed. Take 10 unit on 06/03/2023, take 25 units 2 times daily with meals starting 06/04/2023 06/03/23  Yes Tobie Yetta HERO, MD  metFORMIN  (GLUCOPHAGE ) 1000 MG tablet Take 1 tablet (1,000 mg total) by mouth 2 (two) times daily with a meal. 06/03/23  Yes Tobie Yetta HERO, MD  rosuvastatin  (CRESTOR ) 10 MG tablet Take 1 tablet (10 mg total) by mouth daily. 06/03/23  Yes Tobie Yetta HERO, MD  Blood Glucose Monitoring Suppl DEVI 1 each by Does not apply route 3 (three) times daily. May dispense any manufacturer covered by patient's insurance. 06/03/23   Tobie Yetta HERO, MD  Glucose Blood (BLOOD GLUCOSE TEST STRIPS) STRP 1 each by Does not apply route 3 (three) times daily. Use as directed to check blood sugar. May dispense any manufacturer covered by patient's insurance and fits patient's device. 06/03/23   Tobie Yetta HERO,  MD  Insulin  Pen Needle (PEN NEEDLES) 31G X 5 MM MISC 1 each by Does not apply route 3 (three) times daily. May dispense any manufacturer covered by patient's insurance. 06/03/23   Tobie Yetta HERO, MD  Lancet Device MISC 1 each by Does not apply route 3 (three) times daily. May dispense any manufacturer covered by patient's insurance. 06/03/23   Tobie Yetta HERO, MD  Lancets MISC 1 each by Does not apply route 3 (three) times daily. Use as directed to check blood sugar. May dispense any manufacturer covered by patient's insurance and fits patient's device. 06/03/23   Tobie Yetta HERO, MD      Allergies    Sheep-derived products and Shellfish allergy    Review of Systems   Review of Systems  Respiratory:  Positive for shortness of breath.     Physical Exam Updated Vital Signs BP (!) 159/78   Pulse (!) 129   Temp 98.6 F (37 C) (Oral)   Resp (!) 30   Ht 5' 10.5 (1.791 m)   Wt (!) 146.1 kg   SpO2 97%   BMI 45.55 kg/m  Physical Exam Vitals and nursing note reviewed.  HENT:     Head: Normocephalic and atraumatic.     Mouth/Throat:     Comments: Dry mucous membranes Eyes:     Pupils: Pupils are equal, round, and reactive to light.  Cardiovascular:     Rate and Rhythm:  Regular rhythm. Tachycardia present.  Pulmonary:     Effort: Pulmonary effort is normal. Tachypnea present.     Breath sounds: Normal breath sounds.  Abdominal:     Palpations: Abdomen is soft.     Tenderness: There is no abdominal tenderness.  Skin:    General: Skin is warm and dry.  Neurological:     Mental Status: He is alert.  Psychiatric:        Mood and Affect: Mood normal.     ED Results / Procedures / Treatments   Labs (all labs ordered are listed, but only abnormal results are displayed) Labs Reviewed  RESP PANEL BY RT-PCR (RSV, FLU A&B, COVID)  RVPGX2 - Abnormal; Notable for the following components:      Result Value   Influenza A by PCR POSITIVE (*)    All other components within normal limits   BASIC METABOLIC PANEL - Abnormal; Notable for the following components:   Potassium 6.1 (*)    Chloride 92 (*)    CO2 8 (*)    Glucose, Bld 608 (*)    Creatinine, Ser 2.70 (*)    Calcium  10.8 (*)    GFR, Estimated 29 (*)    Anion gap 35 (*)    All other components within normal limits  CBC - Abnormal; Notable for the following components:   WBC 12.2 (*)    RBC 5.85 (*)    Hemoglobin 17.5 (*)    HCT 55.2 (*)    All other components within normal limits  URINALYSIS, ROUTINE W REFLEX MICROSCOPIC - Abnormal; Notable for the following components:   Glucose, UA >1,000 (*)    Hgb urine dipstick SMALL (*)    Ketones, ur >80 (*)    Protein, ur 30 (*)    All other components within normal limits  OSMOLALITY - Abnormal; Notable for the following components:   Osmolality 358 (*)    All other components within normal limits  BETA-HYDROXYBUTYRIC ACID - Abnormal; Notable for the following components:   Beta-Hydroxybutyric Acid >8.00 (*)    All other components within normal limits  BASIC METABOLIC PANEL - Abnormal; Notable for the following components:   CO2 9 (*)    Glucose, Bld 469 (*)    Creatinine, Ser 2.34 (*)    GFR, Estimated 35 (*)    Anion gap 32 (*)    All other components within normal limits  CBG MONITORING, ED - Abnormal; Notable for the following components:   Glucose-Capillary 542 (*)    All other components within normal limits  CBG MONITORING, ED - Abnormal; Notable for the following components:   Glucose-Capillary 570 (*)    All other components within normal limits  I-STAT VENOUS BLOOD GAS, ED - Abnormal; Notable for the following components:   pH, Ven 7.042 (*)    pCO2, Ven 32.1 (*)    Bicarbonate 8.7 (*)    TCO2 10 (*)    Acid-base deficit 21.0 (*)    Sodium 133 (*)    Potassium 6.3 (*)    HCT 55.0 (*)    Hemoglobin 18.7 (*)    All other components within normal limits  CBG MONITORING, ED - Abnormal; Notable for the following components:    Glucose-Capillary 570 (*)    All other components within normal limits  CBG MONITORING, ED - Abnormal; Notable for the following components:   Glucose-Capillary 464 (*)    All other components within normal limits  CBG MONITORING, ED - Abnormal; Notable  for the following components:   Glucose-Capillary 445 (*)    All other components within normal limits  I-STAT VENOUS BLOOD GAS, ED - Abnormal; Notable for the following components:   pH, Ven 7.122 (*)    pCO2, Ven 32.5 (*)    Bicarbonate 10.6 (*)    TCO2 12 (*)    Acid-base deficit 18.0 (*)    All other components within normal limits  CBG MONITORING, ED - Abnormal; Notable for the following components:   Glucose-Capillary 390 (*)    All other components within normal limits    EKG EKG Interpretation Date/Time:  Tuesday August 07 2023 10:00:28 EST Ventricular Rate:  120 PR Interval:  126 QRS Duration:  72 QT Interval:  334 QTC Calculation: 472 R Axis:   246  Text Interpretation: Sinus tachycardia Right atrial enlargement Right superior axis deviation Pulmonary disease pattern Abnormal ECG When compared with ECG of 03-Jun-2023 00:54, QRS axis Shifted left Nonspecific T wave abnormality no longer evident in Inferior leads Confirmed by Pamella Sharper [phone]) on 08/07/2023 10:58:22 AM  Radiology DG Chest Portable 1 View Result Date: 08/07/2023 CLINICAL DATA:  Shortness of breath.?  Pneumonia EXAM: PORTABLE CHEST 1 VIEW COMPARISON:  06/02/2023. FINDINGS: Bilateral lung fields are clear. Bilateral costophrenic angles are clear. Normal cardio-mediastinal silhouette. No acute osseous abnormalities. The soft tissues are within normal limits. IMPRESSION: No active disease. Electronically Signed   By: Ree Molt M.D.   On: 08/07/2023 11:50    Procedures .Critical Care  Performed by: Pamella Sharper LABOR, DO Authorized by: Pamella Sharper LABOR, DO   Critical care provider statement:    Critical care time (minutes):  90   Critical care time  was exclusive of:  Separately billable procedures and treating other patients and teaching time   Critical care was necessary to treat or prevent imminent or life-threatening deterioration of the following conditions:  Metabolic crisis   Critical care was time spent personally by me on the following activities:  Development of treatment plan with patient or surrogate, discussions with consultants, evaluation of patient's response to treatment, examination of patient, ordering and review of laboratory studies, ordering and review of radiographic studies, ordering and performing treatments and interventions, pulse oximetry, re-evaluation of patient's condition and review of old charts   I assumed direction of critical care for this patient from another provider in my specialty: no     Care discussed with: admitting provider       Medications Ordered in ED Medications  insulin  regular, human (MYXREDLIN ) 100 units/ 100 mL infusion (7.3 Units/hr Intravenous Rate/Dose Change 08/07/23 1519)  potassium chloride  10 mEq in 100 mL IVPB (10 mEq Intravenous New Bag/Given 08/07/23 1525)  lactated ringers  infusion (has no administration in time range)  lactated ringers  bolus 2,922 mL (0 mLs Intravenous Stopped 08/07/23 1400)  ondansetron  (ZOFRAN ) injection 4 mg (4 mg Intravenous Given 08/07/23 1056)  lactated ringers  bolus 1,000 mL (1,000 mLs Intravenous New Bag/Given 08/07/23 1505)    ED Course/ Medical Decision Making/ A&P Clinical Course as of 08/07/23 1608  Tue Aug 07, 2023  1051 Initial blood glucose 542.  Point-of-care VBG notable for acidosis of 7.04.  Potassium 6.3.  Influenza A positive.  Consistent with DKA.  Positive for influenza A [MP]  1527 Slight improvement in pH and bicarb after IV fluids and initiation of insulin  drip.  Repeat potassium was 5.0.  Will provide potassium repletion with 10 mill equivalents in 100 mL.  Patient remains tachycardic and it is likely  he is still very volume down.  Will  continue IV fluids.  Discussed with admitting hospitalist accepts patient for admission [MP]    Clinical Course User Index [MP] Pamella Ozell LABOR, DO                                 Medical Decision Making 43 year old male with history as above presenting with nausea and vomiting for the last 4 to 5 days.  Initial vital signs notable for tachycardia and tachypnea.  Initial point-of-care glucose here was over 500.  With his history and presentation this is most concerning for HHS/DKA.  Will initiate LR IV fluid bolus and obtain laboratory workup.  He has no history of DKA most likely trigger would be a viral illness such as COVID or influenza.  Blood pressure stable.  He does report some coughing as well will obtain chest x-ray to evaluate for potential pneumonia.  Admission likely  Amount and/or Complexity of Data Reviewed Labs: ordered. Radiology: ordered.  Risk Prescription drug management. Decision regarding hospitalization.           Final Clinical Impression(s) / ED Diagnoses Final diagnoses:  DKA, type 2, not at goal [facility])  Influenza A    Rx / DC Orders ED Discharge Orders     None         Pamella Ozell LABOR, DO 08/07/23 1609

## 2023-08-07 NOTE — ED Notes (Signed)
 ED Provider at bedside.

## 2023-08-07 NOTE — ED Notes (Signed)
Notified Dr. Elayne Snare of FSBS of 570. Endotool still in downtime. No change to insulin drip rate, remains at 10.3.

## 2023-08-07 NOTE — ED Notes (Signed)
Patient placed on Bipap per EDP. See documentation for settings. Patient tolerating well at this time.

## 2023-08-08 ENCOUNTER — Inpatient Hospital Stay (HOSPITAL_COMMUNITY): Payer: Self-pay

## 2023-08-08 DIAGNOSIS — J189 Pneumonia, unspecified organism: Secondary | ICD-10-CM

## 2023-08-08 DIAGNOSIS — R6521 Severe sepsis with septic shock: Secondary | ICD-10-CM

## 2023-08-08 DIAGNOSIS — M7989 Other specified soft tissue disorders: Secondary | ICD-10-CM

## 2023-08-08 DIAGNOSIS — N179 Acute kidney failure, unspecified: Secondary | ICD-10-CM

## 2023-08-08 DIAGNOSIS — R578 Other shock: Secondary | ICD-10-CM

## 2023-08-08 DIAGNOSIS — A419 Sepsis, unspecified organism: Secondary | ICD-10-CM

## 2023-08-08 DIAGNOSIS — E1110 Type 2 diabetes mellitus with ketoacidosis without coma: Secondary | ICD-10-CM

## 2023-08-08 DIAGNOSIS — R579 Shock, unspecified: Secondary | ICD-10-CM

## 2023-08-08 LAB — BASIC METABOLIC PANEL
Anion gap: 12 (ref 5–15)
Anion gap: 12 (ref 5–15)
Anion gap: 14 (ref 5–15)
Anion gap: 15 (ref 5–15)
BUN: 20 mg/dL (ref 6–20)
BUN: 19 mg/dL (ref 6–20)
BUN: 21 mg/dL — ABNORMAL HIGH (ref 6–20)
BUN: 26 mg/dL — ABNORMAL HIGH (ref 6–20)
CO2: 17 mmol/L — ABNORMAL LOW (ref 22–32)
CO2: 18 mmol/L — ABNORMAL LOW (ref 22–32)
CO2: 17 mmol/L — ABNORMAL LOW (ref 22–32)
CO2: 15 mmol/L — ABNORMAL LOW (ref 22–32)
Calcium: 9.3 mg/dL (ref 8.9–10.3)
Calcium: 8.8 mg/dL — ABNORMAL LOW (ref 8.9–10.3)
Calcium: 9 mg/dL (ref 8.9–10.3)
Calcium: 8.4 mg/dL — ABNORMAL LOW (ref 8.9–10.3)
Chloride: 107 mmol/L (ref 98–111)
Chloride: 104 mmol/L (ref 98–111)
Chloride: 105 mmol/L (ref 98–111)
Chloride: 106 mmol/L (ref 98–111)
Creatinine, Ser: 2.81 mg/dL — ABNORMAL HIGH (ref 0.61–1.24)
Creatinine, Ser: 2.63 mg/dL — ABNORMAL HIGH (ref 0.61–1.24)
Creatinine, Ser: 2.71 mg/dL — ABNORMAL HIGH (ref 0.61–1.24)
Creatinine, Ser: 3.23 mg/dL — ABNORMAL HIGH (ref 0.61–1.24)
GFR, Estimated: 28 mL/min — ABNORMAL LOW (ref >60)
GFR, Estimated: 30 mL/min — ABNORMAL LOW (ref >60)
GFR, Estimated: 29 mL/min — ABNORMAL LOW (ref >60)
GFR, Estimated: 24 mL/min — ABNORMAL LOW (ref >60)
Glucose, Bld: 320 mg/dL — ABNORMAL HIGH (ref 70–99)
Glucose, Bld: 288 mg/dL — ABNORMAL HIGH (ref 70–99)
Glucose, Bld: 143 mg/dL — ABNORMAL HIGH (ref 70–99)
Glucose, Bld: 234 mg/dL — ABNORMAL HIGH (ref 70–99)
Potassium: 4 mmol/L (ref 3.5–5.1)
Potassium: 3.9 mmol/L (ref 3.5–5.1)
Potassium: 4.4 mmol/L (ref 3.5–5.1)
Potassium: 4.8 mmol/L (ref 3.5–5.1)
Sodium: 136 mmol/L (ref 135–145)
Sodium: 134 mmol/L — ABNORMAL LOW (ref 135–145)
Sodium: 136 mmol/L (ref 135–145)
Sodium: 136 mmol/L (ref 135–145)

## 2023-08-08 LAB — CBC
HCT: 45.7 % (ref 39.0–52.0)
HCT: 43.3 % (ref 39.0–52.0)
Hemoglobin: 15.4 g/dL (ref 13.0–17.0)
Hemoglobin: 14.5 g/dL (ref 13.0–17.0)
MCH: 29.8 pg (ref 26.0–34.0)
MCH: 29.6 pg (ref 26.0–34.0)
MCHC: 33.7 g/dL (ref 30.0–36.0)
MCHC: 33.5 g/dL (ref 30.0–36.0)
MCV: 88.4 fL (ref 80.0–100.0)
MCV: 88.4 fL (ref 80.0–100.0)
Platelets: 178 10*3/uL (ref 150–400)
Platelets: 180 10*3/uL (ref 150–400)
RBC: 5.17 MIL/uL (ref 4.22–5.81)
RBC: 4.9 MIL/uL (ref 4.22–5.81)
RDW: 13.5 % (ref 11.5–15.5)
RDW: 13.6 % (ref 11.5–15.5)
WBC: 9.3 10*3/uL (ref 4.0–10.5)
WBC: 11.1 10*3/uL — ABNORMAL HIGH (ref 4.0–10.5)
nRBC: 0 % (ref 0.0–0.2)
nRBC: 0 % (ref 0.0–0.2)

## 2023-08-08 LAB — GLUCOSE, CAPILLARY
Glucose-Capillary: 115 mg/dL — ABNORMAL HIGH (ref 70–99)
Glucose-Capillary: 123 mg/dL — ABNORMAL HIGH (ref 70–99)
Glucose-Capillary: 125 mg/dL — ABNORMAL HIGH (ref 70–99)
Glucose-Capillary: 131 mg/dL — ABNORMAL HIGH (ref 70–99)
Glucose-Capillary: 141 mg/dL — ABNORMAL HIGH (ref 70–99)
Glucose-Capillary: 156 mg/dL — ABNORMAL HIGH (ref 70–99)
Glucose-Capillary: 183 mg/dL — ABNORMAL HIGH (ref 70–99)
Glucose-Capillary: 195 mg/dL — ABNORMAL HIGH (ref 70–99)
Glucose-Capillary: 200 mg/dL — ABNORMAL HIGH (ref 70–99)
Glucose-Capillary: 205 mg/dL — ABNORMAL HIGH (ref 70–99)
Glucose-Capillary: 236 mg/dL — ABNORMAL HIGH (ref 70–99)
Glucose-Capillary: 270 mg/dL — ABNORMAL HIGH (ref 70–99)
Glucose-Capillary: 277 mg/dL — ABNORMAL HIGH (ref 70–99)
Glucose-Capillary: 279 mg/dL — ABNORMAL HIGH (ref 70–99)
Glucose-Capillary: 291 mg/dL — ABNORMAL HIGH (ref 70–99)
Glucose-Capillary: 309 mg/dL — ABNORMAL HIGH (ref 70–99)
Glucose-Capillary: 314 mg/dL — ABNORMAL HIGH (ref 70–99)
Glucose-Capillary: 338 mg/dL — ABNORMAL HIGH (ref 70–99)

## 2023-08-08 LAB — RESPIRATORY PANEL BY PCR

## 2023-08-08 LAB — ECHOCARDIOGRAM COMPLETE
Area-P 1/2: 2.87 cm2
Calc EF: 53.1 %
Height: 70.5 in
S' Lateral: 3.2 cm
Single Plane A2C EF: 50.1 %
Single Plane A4C EF: 56.9 %
Weight: 4709.03 [oz_av]

## 2023-08-08 LAB — TROPONIN I (HIGH SENSITIVITY)
Troponin I (High Sensitivity): 102 ng/L (ref <18)
Troponin I (High Sensitivity): 98 ng/L — ABNORMAL HIGH (ref <18)

## 2023-08-08 LAB — BRAIN NATRIURETIC PEPTIDE: B Natriuretic Peptide: 215.5 pg/mL — ABNORMAL HIGH (ref 0.0–100.0)

## 2023-08-08 LAB — EXPECTORATED SPUTUM ASSESSMENT W GRAM STAIN, RFLX TO RESP C

## 2023-08-08 LAB — MRSA NEXT GEN BY PCR, NASAL: MRSA by PCR Next Gen: NOT DETECTED

## 2023-08-08 LAB — HIV ANTIBODY (ROUTINE TESTING W REFLEX): HIV Screen 4th Generation wRfx: NONREACTIVE

## 2023-08-08 LAB — BETA-HYDROXYBUTYRIC ACID: Beta-Hydroxybutyric Acid: 0.17 mmol/L (ref 0.05–0.27)

## 2023-08-08 LAB — PHOSPHORUS: Phosphorus: 1.1 mg/dL — ABNORMAL LOW (ref 2.5–4.6)

## 2023-08-08 LAB — LACTIC ACID, PLASMA: Lactic Acid, Venous: 2.2 mmol/L (ref 0.5–1.9)

## 2023-08-08 LAB — D-DIMER, QUANTITATIVE: D-Dimer, Quant: 4.85 ug{FEU}/mL — ABNORMAL HIGH (ref 0.00–0.50)

## 2023-08-08 LAB — PROCALCITONIN: Procalcitonin: 32.41 ng/mL

## 2023-08-08 LAB — MAGNESIUM
Magnesium: 1 mg/dL — ABNORMAL LOW (ref 1.7–2.4)
Magnesium: 1.2 mg/dL — ABNORMAL LOW (ref 1.7–2.4)
Magnesium: 2 mg/dL (ref 1.7–2.4)

## 2023-08-08 MED ORDER — OSELTAMIVIR PHOSPHATE 75 MG PO CAPS: 75.0000 mg | ORAL_CAPSULE | Freq: Two times a day (BID) | ORAL | Status: DC

## 2023-08-08 MED ORDER — INSULIN ASPART 100 UNIT/ML IJ SOLN
2.0000 [IU] | INTRAMUSCULAR | Status: DC
  Administered 2023-08-08: 6 [IU] via SUBCUTANEOUS

## 2023-08-08 MED ORDER — ENOXAPARIN SODIUM 60 MG/0.6ML IJ SOSY
60.0000 mg | PREFILLED_SYRINGE | INTRAMUSCULAR | Status: DC
  Administered 2023-08-09 – 2023-08-10 (×2): 60 mg via SUBCUTANEOUS
  Filled 2023-08-08 (×2): qty 0.6

## 2023-08-08 MED ORDER — OXYCODONE HCL 5 MG PO TABS
5.0000 mg | ORAL_TABLET | ORAL | Status: AC | PRN
  Administered 2023-08-08 – 2023-08-09 (×6): 5 mg via ORAL
  Filled 2023-08-08 (×6): qty 1

## 2023-08-08 MED ORDER — ENOXAPARIN SODIUM 40 MG/0.4ML IJ SOSY
40.0000 mg | PREFILLED_SYRINGE | INTRAMUSCULAR | Status: DC
  Administered 2023-08-08: 40 mg via SUBCUTANEOUS
  Filled 2023-08-08: qty 0.4

## 2023-08-08 MED ORDER — SODIUM CHLORIDE 0.9 % IV BOLUS
500.0000 mL | Freq: Once | INTRAVENOUS | Status: AC
Start: 2023-08-08 — End: 2023-08-08
  Administered 2023-08-08: 500 mL via INTRAVENOUS

## 2023-08-08 MED ORDER — MAGNESIUM SULFATE 50 % IJ SOLN
3.0000 g | Freq: Once | INTRAVENOUS | Status: AC
  Administered 2023-08-08: 3 g via INTRAVENOUS
  Filled 2023-08-08: qty 6

## 2023-08-08 MED ORDER — ROSUVASTATIN CALCIUM 5 MG PO TABS
10.0000 mg | ORAL_TABLET | Freq: Every day | ORAL | Status: DC
Start: 2023-08-08 — End: 2023-08-13
  Administered 2023-08-08 – 2023-08-13 (×6): 10 mg via ORAL
  Filled 2023-08-08 (×6): qty 2

## 2023-08-08 MED ORDER — ONDANSETRON HCL 4 MG/2ML IJ SOLN
4.0000 mg | Freq: Four times a day (QID) | INTRAMUSCULAR | Status: DC | PRN
  Administered 2023-08-08 – 2023-08-13 (×3): 4 mg via INTRAVENOUS
  Filled 2023-08-08 (×3): qty 2

## 2023-08-08 MED ORDER — SODIUM CHLORIDE 0.9 % IV SOLN
500.0000 mg | INTRAVENOUS | Status: DC
  Administered 2023-08-08 – 2023-08-11 (×4): 500 mg via INTRAVENOUS
  Filled 2023-08-08 (×4): qty 5

## 2023-08-08 MED ORDER — DEXTROSE 50 % IV SOLN: 0.0000 mL | INTRAVENOUS | Status: DC | PRN

## 2023-08-08 MED ORDER — LIDOCAINE HCL (PF) 1 % IJ SOLN
INTRAMUSCULAR | Status: AC
  Filled 2023-08-08: qty 5

## 2023-08-08 MED ORDER — INSULIN GLARGINE-YFGN 100 UNIT/ML ~~LOC~~ SOLN
15.0000 [IU] | Freq: Two times a day (BID) | SUBCUTANEOUS | Status: DC
  Administered 2023-08-08: 15 [IU] via SUBCUTANEOUS
  Filled 2023-08-08 (×3): qty 0.15

## 2023-08-08 MED ORDER — MAGNESIUM SULFATE 4 GM/100ML IV SOLN
4.0000 g | Freq: Once | INTRAVENOUS | Status: AC
  Administered 2023-08-08: 4 g via INTRAVENOUS
  Filled 2023-08-08: qty 100

## 2023-08-08 MED ORDER — SODIUM PHOSPHATES 45 MMOLE/15ML IV SOLN
30.0000 mmol | Freq: Once | INTRAVENOUS | Status: AC
  Administered 2023-08-08: 30 mmol via INTRAVENOUS
  Filled 2023-08-08: qty 10

## 2023-08-08 MED ORDER — GUAIFENESIN 100 MG/5ML PO LIQD
10.0000 mL | Freq: Three times a day (TID) | ORAL | Status: DC
  Administered 2023-08-08 – 2023-08-13 (×16): 10 mL via ORAL
  Filled 2023-08-08 (×2): qty 10
  Filled 2023-08-08: qty 15
  Filled 2023-08-08 (×2): qty 10
  Filled 2023-08-08: qty 15
  Filled 2023-08-08 (×3): qty 10
  Filled 2023-08-08: qty 15
  Filled 2023-08-08 (×2): qty 10
  Filled 2023-08-08: qty 15
  Filled 2023-08-08 (×2): qty 10
  Filled 2023-08-08: qty 15

## 2023-08-08 MED ORDER — NOREPINEPHRINE 16 MG/250ML-% IV SOLN
0.0000 ug/min | INTRAVENOUS | Status: DC
  Administered 2023-08-08: 24 ug/min via INTRAVENOUS
  Filled 2023-08-08: qty 250

## 2023-08-08 MED ORDER — OSELTAMIVIR PHOSPHATE 30 MG PO CAPS
30.0000 mg | ORAL_CAPSULE | Freq: Two times a day (BID) | ORAL | Status: AC
  Administered 2023-08-08 – 2023-08-12 (×9): 30 mg via ORAL
  Filled 2023-08-08 (×10): qty 1

## 2023-08-08 MED ORDER — POLYETHYLENE GLYCOL 3350 17 G PO PACK
17.0000 g | PACK | Freq: Every day | ORAL | Status: DC | PRN
  Administered 2023-08-09 – 2023-08-11 (×2): 17 g via ORAL
  Filled 2023-08-08 (×2): qty 1

## 2023-08-08 MED ORDER — VASOPRESSIN 20 UNITS/100 ML INFUSION FOR SHOCK
0.0000 [IU]/min | INTRAVENOUS | Status: DC
  Administered 2023-08-08 (×2): 0.03 [IU]/min via INTRAVENOUS
  Filled 2023-08-08: qty 100

## 2023-08-08 MED ORDER — INSULIN REGULAR(HUMAN) IN NACL 100-0.9 UT/100ML-% IV SOLN
INTRAVENOUS | Status: DC
  Administered 2023-08-09: 11.5 [IU]/h via INTRAVENOUS
  Filled 2023-08-08: qty 100

## 2023-08-08 MED ORDER — VASOPRESSIN 20 UNITS/100 ML INFUSION FOR SHOCK
INTRAVENOUS | Status: AC
  Administered 2023-08-08: 0.03 [IU]/min via INTRAVENOUS
  Filled 2023-08-08: qty 100

## 2023-08-08 MED ORDER — DOCUSATE SODIUM 100 MG PO CAPS
100.0000 mg | ORAL_CAPSULE | Freq: Two times a day (BID) | ORAL | Status: DC | PRN
  Administered 2023-08-09 – 2023-08-11 (×3): 100 mg via ORAL
  Filled 2023-08-08 (×3): qty 1

## 2023-08-08 MED ORDER — LACTATED RINGERS IV BOLUS
1000.0000 mL | Freq: Once | INTRAVENOUS | Status: AC
  Administered 2023-08-08: 1000 mL via INTRAVENOUS

## 2023-08-08 MED ORDER — NOREPINEPHRINE 4 MG/250ML-% IV SOLN
0.0000 ug/min | INTRAVENOUS | Status: DC
  Administered 2023-08-08: 28 ug/min via INTRAVENOUS
  Administered 2023-08-08: 18 ug/min via INTRAVENOUS
  Administered 2023-08-08: 31 ug/min via INTRAVENOUS
  Filled 2023-08-08 (×2): qty 250

## 2023-08-08 MED ORDER — OSELTAMIVIR PHOSPHATE 75 MG PO CAPS
75.0000 mg | ORAL_CAPSULE | Freq: Once | ORAL | Status: AC
Start: 2023-08-08 — End: 2023-08-08
  Administered 2023-08-08: 75 mg via ORAL
  Filled 2023-08-08: qty 1

## 2023-08-08 MED ORDER — VANCOMYCIN VARIABLE DOSE PER UNSTABLE RENAL FUNCTION (PHARMACIST DOSING): Status: DC

## 2023-08-08 MED ORDER — SODIUM CHLORIDE 0.9 % IV SOLN
2.0000 g | INTRAVENOUS | Status: DC
Start: 2023-08-08 — End: 2023-08-13
  Administered 2023-08-08 – 2023-08-13 (×6): 2 g via INTRAVENOUS
  Filled 2023-08-08 (×6): qty 20

## 2023-08-08 MED ORDER — SODIUM PHOSPHATES 45 MMOLE/15ML IV SOLN
15.0000 mmol | Freq: Once | INTRAVENOUS | Status: AC
  Administered 2023-08-08: 15 mmol via INTRAVENOUS
  Filled 2023-08-08: qty 5

## 2023-08-08 NOTE — Progress Notes (Signed)
 eLink Physician-Brief Progress Note Patient Name: Mark Schneider DOB: 1980/07/13 MRN: [id number]   Date of Service  08/08/2023  HPI/Events of Note  Patient admitted with DKA in the context of new Influenza A infection, following volume resuscitation he developed acute hypoxic respiratory failure requiring BIPAP, and when BIPAP was placed patient became hypotensive, requiring pressors.  eICU Interventions  New Patient Evaluation.        Krisalyn Yankowski U Rubie Ficco 08/08/2023, 1:23 AM

## 2023-08-08 NOTE — Progress Notes (Signed)
 Pharmacy Antibiotic Note  Mark Schneider is a 43 y.o. male admitted on 08/07/2023 with pneumonia.  Pharmacy has been consulted for vancomycin  dosing.  Received vancomycin  2g total loading dose this AM ~2AM SCr rising 2.63 > 2.71   Plan: Check vancomycin  random level in AM, plan to redose if < 15 mcg/ml Follow up renal function, cultures as available, clinical progress, length of tx  Height: 5' 10.5 (179.1 cm) Weight: 133.5 kg (294 lb 5 oz) IBW/kg (Calculated) : 74.15  Temp (24hrs), Avg:98.8 F (37.1 C), Min:98.1 F (36.7 C), Max:99.6 F (37.6 C)  Recent Labs  Lab 08/07/23 1035 08/07/23 1353 08/07/23 2025 08/07/23 2127 08/08/23 0015 08/08/23 0210 08/08/23 0459 08/08/23 1006  WBC 12.2*  --   --   --   --  9.3 11.1*  --   CREATININE 2.70* 2.34* 2.47*  --   --  2.81* 2.63* 2.71*  LATICACIDVEN  --   --   --  4.7* 2.2*  --   --   --     Estimated Creatinine Clearance: 49.7 mL/min (A) (by C-G formula based on SCr of 2.71 mg/dL (H)).    Allergies  Allergen Reactions   Sheep-Derived Products Swelling   Shellfish Allergy Swelling    Antimicrobials this admission: Ceftriaxone  2/4 >>  Vancomycin  2/5 >>  Azithro 2/5 >> Tamiflu  >>  Dose adjustments this admission: N/a  Microbiology results: 2/4 BCx: ngtd 2/4 Resp panel: influenza A + 2/4 MRSA PCR: neg 2/5 Sputum: few GPC in singles  Thank you for allowing pharmacy to be a part of this patient's care.  Rocky Slade, PharmD, BCPS 08/08/2023 4:31 PM   Please check AMION for all [facility] Pharmacy phone numbers After 10:00 PM, call Main Pharmacy [phone] 08/08/2023 4:25 PM

## 2023-08-08 NOTE — Procedures (Signed)
 Arterial Catheter Insertion Procedure Note  Mark AVID Schneider  [id number]  11-09-1980  Date:08/08/23  Time:2:31 AM    Provider Performing: Deward LELON Eastern    Procedure: Insertion of Arterial Line (63379) with US  guidance (23062)   Indication(s) Blood pressure monitoring and/or need for frequent ABGs  Consent Risks of the procedure as well as the alternatives and risks of each were explained to the patient and/or caregiver.  Consent for the procedure was obtained and is signed in the bedside chart  Anesthesia None   Time Out Verified patient identification, verified procedure, site/side was marked, verified correct patient position, special equipment/implants available, medications/allergies/relevant history reviewed, required imaging and test results available.   Sterile Technique Maximal sterile technique including full sterile barrier drape, hand hygiene, sterile gown, sterile gloves, mask, hair covering, sterile ultrasound probe cover (if used).   Procedure Description Area of catheter insertion was cleaned with chlorhexidine  and draped in sterile fashion. With real-time ultrasound guidance an arterial catheter was placed into the left radial artery.  Appropriate arterial tracings confirmed on monitor.     Complications/Tolerance None; patient tolerated the procedure well.   EBL Minimal   Specimen(s) None   Deward Eastern, AGACNP-BC [facility]  See Amion for personal pager PCCM on call pager [phone] until 7pm. Please call Elink 7p-7a. [phone]  08/08/2023 2:31 AM

## 2023-08-08 NOTE — Progress Notes (Signed)
   08/08/23 2229  Vent Select  Invasive or Noninvasive Noninvasive  Adult Vent Y  Adult Ventilator Settings  Vent Type Servo i  Humidity HME  Vent Mode BIPAP;PCV  Set Rate 14 bmp  FiO2 (%) 40 %  I Time 0.8 Sec(s)  IPAP 12 cmH20  EPAP 5 cmH20  Pressure Control 7 cmH20  PEEP 5 cmH20  Adult Ventilator Measurements  Peak Airway Pressure 15 L/min  Mean Airway Pressure 5 cmH20  Resp Rate Spontaneous 16 br/min  Resp Rate Total 30 br/min  Exhaled Vt 722 mL  Measured Ve 21.8 L  I:E Ratio Measured 1:1.5  Auto PEEP 0 cmH20  Total PEEP 5 cmH20  Adult Ventilator Alarms  Alarms On Y  Ve High Alarm 26 L/min  Ve Low Alarm 4 L/min  Resp Rate High Alarm 38 br/min  Resp Rate Low Alarm 12  PEEP Low Alarm 2 cmH2O  Press High Alarm 30 cmH2O  VAP Prevention  HOB> 30 Degrees Y

## 2023-08-08 NOTE — Consult Note (Addendum)
 WOC Nurse Consult Note: Reason for Consult: Consult requested for left groin wound.  Performed remotely after review of progress notes and photo in the EMR. Left inner groin with full thickness wound; 1X1.5X.5cm, according to bedside nurse's wound care flow sheet, red and moist with mod amt pink drainage.  Dressing procedure/placement/frequency: Topical treatment orders provided for bedside nurses to perform as follows to absorb drainage and provide antimicrobial benefits:  Cut piece of Aquacel Soila # [phone]) and apply to left groin Q day, then cover with foam dressing. Change foam dressing Q 3 days or PRN soiling. Please re-consult if further assistance is needed.  Thank-you,  Stephane Fought MSN, RN, CWOCN, [place], CNS [phone]

## 2023-08-08 NOTE — Procedures (Signed)
 Central Venous Catheter Insertion Procedure Note  Mark Schneider  [id number]  [birth year]  Date:08/08/23  Time:12:22 AM   Provider Performing:Aaiden Depoy SHAUNNA Gretta   Procedure: Insertion of Non-tunneled Central Venous [id number]) with US  guidance (23062)   Indication(s) Medication administration  Consent Risks of the procedure as well as the alternatives and risks of each were explained to the patient and/or caregiver.  Consent for the procedure was obtained and is signed in the bedside chart  Anesthesia Topical only with 1% lidocaine    Timeout Verified patient identification, verified procedure, site/side was marked, verified correct patient position, special equipment/implants available, medications/allergies/relevant history reviewed, required imaging and test results available.  Sterile Technique Maximal sterile technique including full sterile barrier drape, hand hygiene, sterile gown, sterile gloves, mask, hair covering, sterile ultrasound probe cover (if used).  Procedure Description Area of catheter insertion was cleaned with chlorhexidine  and draped in sterile fashion.  With real-time ultrasound guidance a central venous catheter was placed into the right internal jugular vein. Nonpulsatile blood flow and easy flushing noted in all ports. Guidewire confirmed in compressible vein before dilation.  The catheter was sutured in place and sterile dressing applied.  Complications/Tolerance None; patient tolerated the procedure well. Chest X-ray is ordered to verify placement for internal jugular or subclavian cannulation.   Chest x-ray is not ordered for femoral cannulation.  EBL Minimal  Specimen(s) None  Leita SHAUNNA Gretta, DO 08/08/23 12:22 AM [facility]  For contact information, see Amion. If no response to pager, please call PCCM consult pager. After hours, 7PM- 7AM, please call Elink.

## 2023-08-08 NOTE — Plan of Care (Signed)

## 2023-08-08 NOTE — Progress Notes (Signed)
 PHARMACY - ANTICOAGULATION CONSULT NOTE  Pharmacy Consult for lovenox  Indication: VTE prophylaxis  Allergies  Allergen Reactions   Sheep-Derived Products Swelling   Shellfish Allergy Swelling    Patient Measurements: Height: 5' 10.5 (179.1 cm) Weight: 133.5 kg (294 lb 5 oz) IBW/kg (Calculated) : 74.15  Labs: Recent Labs    08/07/23 1035 08/07/23 1353 08/07/23 1935 08/07/23 2025 08/08/23 0210 08/08/23 0459 08/08/23 1006  HGB 17.5*  18.7*   < > 16.7  --  15.4 14.5  --   HCT 55.2*  55.0*   < > 49.0  --  45.7 43.3  --   PLT 227  --   --   --  178 180  --   CREATININE 2.70*   < >  --    < > 2.81* 2.63* 2.71*   < > = values in this interval not displayed.    Estimated Creatinine Clearance: 49.7 mL/min (A) (by C-G formula based on SCr of 2.71 mg/dL (H)).   Medical History: Past Medical History:  Diagnosis Date   Acute renal failure (ARF) (HCC) 11/15/2017   DM (diabetes mellitus) (HCC)    Hypertension     A/P: Adjusted lovenox  to 60 mg q 24 hrs per obesity prophylaxis protocol.  Harlene Barlow, Berdine JONETTA [employer]P, BCCP Clinical Pharmacist  08/08/2023 11:30 AM   [facility] pharmacy phone numbers are listed on [email or web address]

## 2023-08-08 NOTE — Progress Notes (Signed)
 NAME:  USBALDO PANNONE, MRN:  [id number], DOB:  1981/03/29, LOS: 1 ADMISSION DATE:  08/07/2023, CONSULTATION DATE:  2/4 REFERRING MD:  Lawsing-ED, CHIEF COMPLAINT:  respiratory failure   History of Present Illness:  Mr. Finnigan is a 43 y/o gentleman with a history of DM who presented to the ED with nausea, vomiting, SOB, myalgias, and sore throat since 1/31. He was found to have DKA and was volume resuscitated started on an insulin  infusion. After about 4L  IVF he developed respiratory failure and required bipap. IVF were stopped, He developed hypotension requiring vasopressors to be started. Flu A +. He was transferred to [facility] for admission.   Pertinent  Medical History  DM2 Obesity HTN   Significant Hospital Events: Including procedures, antibiotic start and stop dates in addition to other pertinent events   2/4 admitted dka on bipap; shock on pressors  Interim History / Subjective:  On 24 levo and vaso On HHFNC 70% 45 l/m sats 96% CVP 3   Objective   Blood pressure 115/82, pulse 100, temperature 98.6 F (37 C), temperature source Oral, resp. rate (!) 26, height 5' 10.5 (1.791 m), weight 133.5 kg, SpO2 94%. CVP:  [0 mmHg-6 mmHg] 3 mmHg  Vent Mode: BIPAP;PCV FiO2 (%):  [40 %-90 %] 70 % Set Rate:  [16 bmp] 16 bmp PEEP:  [6 cmH20] 6 cmH20 Pressure Support:  [5 cmH20] 5 cmH20   Intake/Output Summary (Last 24 hours) at 08/08/2023 0857 Last data filed at 08/08/2023 0700 Gross per 24 hour  Intake 6944.73 ml  Output --  Net 6944.73 ml   Filed Weights   08/07/23 1042 08/07/23 2315 08/08/23 0420  Weight: (!) 146.1 kg 133.5 kg 133.5 kg    Examination: General:   NAD HEENT: MM pink/dry; HHFNC in place Neuro: Aox3; MAE CV: s1s2, RRR, no m/r/g PULM:  dim clear BS bilaterally; HHFNC 70% 45 l/m GI: soft, bsx4 active  Extremities: warm/dry, no edema  Skin: no rashes or lesions      Resolved Hospital Problem list     Assessment & Plan:   Acute respiratory failure due to  Flu pneumonia, concern for superimposed bacterial pneumonia Plan: -wean HHFNC for sats >92% -bipap prn -tamiflu  -mrsa pcr negative; dc vanc -azithro/rocephin  for possible cap -f/u urine leg/strep, pct, expectorated sputum -pulm toiletry -d-dimer elevated; hold on CTA chest w/ increase wob and inability to lay flat and AKI; will check echo and dvt us ; also check bnp and trop  Shock; concern for septic etiology Lactic acidosis Plan: -cont levo/vaso for map goal >65 -cvp 3; iv fluids -abx as above for possible cap -echo -follow cultures  DKA, precipitated by infection Plan: -Insulin  per endotool -CBG monitoring -iv fluids -trend B-Hydroxy -serial bmp -A1c pending  AKI on CKD 2; recent baseline Cr ~1.7 Hyperkalemia due to DKA resolved Hypomagnesemia Hypophosphatemia Plan: -na phosph and mag replete -iv fluids -Trend BMP / urinary output -Replace electrolytes as indicated -Avoid nephrotoxic agents, ensure adequate renal perfusion  H/o HTN hld Plan: -hold PTA amlodipine  -statin     Best Practice (right click and Reselect all SmartList Selections daily)   Diet/type: NPO DVT prophylaxis LMWH Pressure ulcer(s): N/A GI prophylaxis: N/A Lines: Central line and Arterial Line Foley:  N/A Code Status:  full code Last date of multidisciplinary goals of care discussion [2/5 patient and mother updated at bedside ]  Labs   CBC: Recent Labs  Lab 08/07/23 1035 08/07/23 1526 08/07/23 1935 08/08/23 0210 08/08/23 0459  WBC 12.2*  --   --  9.3 11.1*  HGB 17.5*  18.7* 16.3 16.7 15.4 14.5  HCT 55.2*  55.0* 48.0 49.0 45.7 43.3  MCV 94.4  --   --  88.4 88.4  PLT 227  --   --  178 180    Basic Metabolic Panel: Recent Labs  Lab 08/07/23 1035 08/07/23 1353 08/07/23 1526 08/07/23 1935 08/07/23 2025 08/08/23 0210 08/08/23 0459  NA 135  133* 140 139 137 136 136 134*  K 6.1*  6.3* 5.0 5.1 5.6* 4.7 4.0 3.9  CL 92* 99  --   --  105 107 104  CO2 8* 9*  --   --   11* 17* 18*  GLUCOSE 608* 469*  --   --  362* 320* 288*  BUN 20 19  --   --  19 20 19   CREATININE 2.70* 2.34*  --   --  2.47* 2.81* 2.63*  CALCIUM  10.8* 10.1  --   --  9.4 9.3 8.8*  MG  --   --   --   --   --   --  1.0*  PHOS  --   --   --   --   --   --  1.1*   GFR: Estimated Creatinine Clearance: 51.2 mL/min (A) (by C-G formula based on SCr of 2.63 mg/dL (H)). Recent Labs  Lab 08/07/23 1035 08/07/23 2127 08/08/23 0015 08/08/23 0210 08/08/23 0459  WBC 12.2*  --   --  9.3 11.1*  LATICACIDVEN  --  4.7* 2.2*  --   --     Liver Function Tests: No results for input(s): AST, ALT, ALKPHOS, BILITOT, PROT, ALBUMIN in the last 168 hours. No results for input(s): LIPASE, AMYLASE in the last 168 hours. No results for input(s): AMMONIA in the last 168 hours.  ABG    Component Value Date/Time   HCO3 10.1 (L) 08/07/2023 1935   TCO2 11 (L) 08/07/2023 1935   ACIDBASEDEF 15.0 (H) 08/07/2023 1935   O2SAT 58 08/07/2023 1935     Coagulation Profile: No results for input(s): INR, PROTIME in the last 168 hours.  Cardiac Enzymes: No results for input(s): CKTOTAL, CKMB, CKMBINDEX, TROPONINI in the last 168 hours.  HbA1C: Hemoglobin A1C  Date/Time Value Ref Range Status  10/09/2022 02:55 PM 6.0 (A) 4.0 - 5.6 % Final   Hgb A1c MFr Bld  Date/Time Value Ref Range Status  06/03/2023 12:26 AM 14.1 (H) 4.8 - 5.6 % Final    Comment:    (NOTE) Pre diabetes:          5.7%-6.4%  Diabetes:              >6.4%  Glycemic control for   <7.0% adults with diabetes   07/31/2022 04:39 PM 6.3 (H) 4.8 - 5.6 % Final    Comment:             Prediabetes: 5.7 - 6.4          Diabetes: >6.4          Glycemic control for adults with diabetes: <7.0     CBG: Recent Labs  Lab 08/07/23 1803 08/07/23 1917 08/07/23 2032 08/07/23 2130 08/07/23 2233  GLUCAP 416* 360* 352* 295* 278*    Review of Systems:   Limited due to respiratory failure  Past Medical History:   He,  has a past medical history of Acute renal failure (ARF) (HCC) (11/15/2017), DM (diabetes mellitus) (HCC), and Hypertension.   Surgical History:   Past Surgical History:  Procedure Laterality Date   DENTAL SURGERY     TOE SURGERY       Social History:   reports that he has never smoked. He has never used smokeless tobacco. He reports that he does not drink alcohol and does not use drugs.   Family History:  His family history includes Diabetes in his maternal grandfather, maternal grandmother, maternal uncle, mother, and sister.   Allergies Allergies  Allergen Reactions   Sheep-Derived Products Swelling   Shellfish Allergy Swelling     Home Medications  Prior to Admission medications   Medication Sig Start Date End Date Taking? Authorizing Provider  amLODipine  (NORVASC ) 10 MG tablet Take 1 tablet (10 mg total) by mouth daily. 06/03/23  Yes Tobie Yetta HERO, MD  insulin  isophane & regular human KwikPen (NOVOLIN  70/30 KWIKPEN) (70-30) 100 UNIT/ML KwikPen Inject 25 Units into the skin as directed. Take 10 unit on 06/03/2023, take 25 units 2 times daily with meals starting 06/04/2023 06/03/23  Yes Tobie Yetta HERO, MD  metFORMIN  (GLUCOPHAGE ) 1000 MG tablet Take 1 tablet (1,000 mg total) by mouth 2 (two) times daily with a meal. 06/03/23  Yes Tobie Yetta HERO, MD  rosuvastatin  (CRESTOR ) 10 MG tablet Take 1 tablet (10 mg total) by mouth daily. 06/03/23  Yes Tobie Yetta HERO, MD  Blood Glucose Monitoring Suppl DEVI 1 each by Does not apply route 3 (three) times daily. May dispense any manufacturer covered by patient's insurance. 06/03/23   Tobie Yetta HERO, MD  Glucose Blood (BLOOD GLUCOSE TEST STRIPS) STRP 1 each by Does not apply route 3 (three) times daily. Use as directed to check blood sugar. May dispense any manufacturer covered by patient's insurance and fits patient's device. 06/03/23   Tobie Yetta HERO, MD  Insulin  Pen Needle (PEN NEEDLES) 31G X 5 MM MISC 1 each by Does not apply route 3  (three) times daily. May dispense any manufacturer covered by patient's insurance. 06/03/23   Tobie Yetta HERO, MD  Lancet Device MISC 1 each by Does not apply route 3 (three) times daily. May dispense any manufacturer covered by patient's insurance. 06/03/23   Tobie Yetta HERO, MD  Lancets MISC 1 each by Does not apply route 3 (three) times daily. Use as directed to check blood sugar. May dispense any manufacturer covered by patient's insurance and fits patient's device. 06/03/23   Tobie Yetta HERO, MD     Critical care time: 40 min.    JD Emilio RIGGERS [facility] 08/08/2023, 10:03 AM  Please see [email or web address] for pager details.  From 7A-7P if no response, please call [phone]. After hours, please call ELink [phone].

## 2023-08-08 NOTE — Progress Notes (Signed)
 Echocardiogram 2D Echocardiogram has been performed.  Emmaline Haring Shamond Skelton RDCS 08/08/2023, 10:57 AM

## 2023-08-09 DIAGNOSIS — R579 Shock, unspecified: Secondary | ICD-10-CM

## 2023-08-09 DIAGNOSIS — J101 Influenza due to other identified influenza virus with other respiratory manifestations: Secondary | ICD-10-CM

## 2023-08-09 LAB — BASIC METABOLIC PANEL
Anion gap: 11 (ref 5–15)
Anion gap: 15 (ref 5–15)
Anion gap: 16 — ABNORMAL HIGH (ref 5–15)
BUN: 27 mg/dL — ABNORMAL HIGH (ref 6–20)
BUN: 27 mg/dL — ABNORMAL HIGH (ref 6–20)
BUN: 30 mg/dL — ABNORMAL HIGH (ref 6–20)
CO2: 13 mmol/L — ABNORMAL LOW (ref 22–32)
CO2: 14 mmol/L — ABNORMAL LOW (ref 22–32)
CO2: 17 mmol/L — ABNORMAL LOW (ref 22–32)
Calcium: 7.9 mg/dL — ABNORMAL LOW (ref 8.9–10.3)
Calcium: 8.1 mg/dL — ABNORMAL LOW (ref 8.9–10.3)
Calcium: 8.1 mg/dL — ABNORMAL LOW (ref 8.9–10.3)
Chloride: 106 mmol/L (ref 98–111)
Chloride: 107 mmol/L (ref 98–111)
Chloride: 109 mmol/L (ref 98–111)
Creatinine, Ser: 3.51 mg/dL — ABNORMAL HIGH (ref 0.61–1.24)
Creatinine, Ser: 3.56 mg/dL — ABNORMAL HIGH (ref 0.61–1.24)
Creatinine, Ser: 3.82 mg/dL — ABNORMAL HIGH (ref 0.61–1.24)
GFR, Estimated: 19 mL/min — ABNORMAL LOW (ref >60)
GFR, Estimated: 21 mL/min — ABNORMAL LOW (ref >60)
GFR, Estimated: 21 mL/min — ABNORMAL LOW (ref >60)
Glucose, Bld: 201 mg/dL — ABNORMAL HIGH (ref 70–99)
Glucose, Bld: 277 mg/dL — ABNORMAL HIGH (ref 70–99)
Glucose, Bld: 341 mg/dL — ABNORMAL HIGH (ref 70–99)
Potassium: 3.8 mmol/L (ref 3.5–5.1)
Potassium: 4.1 mmol/L (ref 3.5–5.1)
Potassium: 4.5 mmol/L (ref 3.5–5.1)
Sodium: 135 mmol/L (ref 135–145)
Sodium: 135 mmol/L (ref 135–145)
Sodium: 138 mmol/L (ref 135–145)

## 2023-08-09 LAB — GLUCOSE, CAPILLARY
Glucose-Capillary: 156 mg/dL — ABNORMAL HIGH (ref 70–99)
Glucose-Capillary: 160 mg/dL — ABNORMAL HIGH (ref 70–99)
Glucose-Capillary: 162 mg/dL — ABNORMAL HIGH (ref 70–99)
Glucose-Capillary: 184 mg/dL — ABNORMAL HIGH (ref 70–99)
Glucose-Capillary: 188 mg/dL — ABNORMAL HIGH (ref 70–99)
Glucose-Capillary: 199 mg/dL — ABNORMAL HIGH (ref 70–99)
Glucose-Capillary: 204 mg/dL — ABNORMAL HIGH (ref 70–99)
Glucose-Capillary: 205 mg/dL — ABNORMAL HIGH (ref 70–99)
Glucose-Capillary: 224 mg/dL — ABNORMAL HIGH (ref 70–99)
Glucose-Capillary: 226 mg/dL — ABNORMAL HIGH (ref 70–99)
Glucose-Capillary: 244 mg/dL — ABNORMAL HIGH (ref 70–99)
Glucose-Capillary: 266 mg/dL — ABNORMAL HIGH (ref 70–99)
Glucose-Capillary: 304 mg/dL — ABNORMAL HIGH (ref 70–99)
Glucose-Capillary: 317 mg/dL — ABNORMAL HIGH (ref 70–99)

## 2023-08-09 LAB — CBC
HCT: 39.1 % (ref 39.0–52.0)
Hemoglobin: 13.2 g/dL (ref 13.0–17.0)
MCH: 29.7 pg (ref 26.0–34.0)
MCHC: 33.8 g/dL (ref 30.0–36.0)
MCV: 88.1 fL (ref 80.0–100.0)
Platelets: 137 10*3/uL — ABNORMAL LOW (ref 150–400)
RBC: 4.44 MIL/uL (ref 4.22–5.81)
RDW: 14 % (ref 11.5–15.5)
WBC: 14.6 10*3/uL — ABNORMAL HIGH (ref 4.0–10.5)
nRBC: 0 % (ref 0.0–0.2)

## 2023-08-09 LAB — PHOSPHORUS: Phosphorus: 3.1 mg/dL (ref 2.5–4.6)

## 2023-08-09 LAB — MAGNESIUM
Magnesium: 2.1 mg/dL (ref 1.7–2.4)
Magnesium: 2 mg/dL (ref 1.7–2.4)

## 2023-08-09 LAB — HEMOGLOBIN A1C
Hgb A1c MFr Bld: >15.5 % — ABNORMAL HIGH (ref 4.8–5.6)
Mean Plasma Glucose: >398 mg/dL

## 2023-08-09 LAB — VANCOMYCIN, RANDOM: Vancomycin Rm: 12 ug/mL

## 2023-08-09 MED ORDER — LACTATED RINGERS IV SOLN: INTRAVENOUS | Status: AC

## 2023-08-09 MED ORDER — VANCOMYCIN HCL 1500 MG/300ML IV SOLN
1500.0000 mg | INTRAVENOUS | Status: DC
  Administered 2023-08-09: 1500 mg via INTRAVENOUS
  Filled 2023-08-09: qty 300

## 2023-08-09 MED ORDER — HYDROMORPHONE HCL 1 MG/ML IJ SOLN
0.5000 mg | Freq: Once | INTRAMUSCULAR | Status: AC
  Administered 2023-08-09: 0.5 mg via INTRAVENOUS
  Filled 2023-08-09: qty 0.5

## 2023-08-09 MED ORDER — INSULIN GLARGINE-YFGN 100 UNIT/ML ~~LOC~~ SOLN
22.0000 [IU] | Freq: Two times a day (BID) | SUBCUTANEOUS | Status: DC
  Administered 2023-08-09 – 2023-08-12 (×6): 22 [IU] via SUBCUTANEOUS
  Filled 2023-08-09 (×8): qty 0.22

## 2023-08-09 MED ORDER — SODIUM BICARBONATE 650 MG PO TABS
650.0000 mg | ORAL_TABLET | Freq: Once | ORAL | Status: AC
  Administered 2023-08-09: 650 mg via ORAL
  Filled 2023-08-09: qty 1

## 2023-08-09 MED ORDER — LACTATED RINGERS IV BOLUS
500.0000 mL | Freq: Once | INTRAVENOUS | Status: AC
  Administered 2023-08-09: 500 mL via INTRAVENOUS

## 2023-08-09 MED ORDER — INSULIN ASPART 100 UNIT/ML IJ SOLN
3.0000 [IU] | INTRAMUSCULAR | Status: DC
  Administered 2023-08-09 (×2): 6 [IU] via SUBCUTANEOUS
  Administered 2023-08-09: 9 [IU] via SUBCUTANEOUS
  Administered 2023-08-10 (×3): 6 [IU] via SUBCUTANEOUS
  Administered 2023-08-10: 9 [IU] via SUBCUTANEOUS
  Administered 2023-08-10 (×2): 6 [IU] via SUBCUTANEOUS
  Administered 2023-08-10 – 2023-08-11 (×3): 3 [IU] via SUBCUTANEOUS

## 2023-08-09 MED ORDER — POTASSIUM CHLORIDE CRYS ER 20 MEQ PO TBCR
40.0000 meq | EXTENDED_RELEASE_TABLET | Freq: Once | ORAL | Status: AC
  Administered 2023-08-09: 40 meq via ORAL
  Filled 2023-08-09: qty 2

## 2023-08-09 NOTE — Progress Notes (Addendum)
 NAME:  Mark Schneider, MRN:  [id number], DOB:  1981-05-30, LOS: 2 ADMISSION DATE:  08/07/2023, CONSULTATION DATE:  2/4 REFERRING MD:  Lawsing-ED, CHIEF COMPLAINT:  respiratory failure   History of Present Illness:  Mark Schneider is a 43 y/o gentleman with a history of DM who presented to the ED with nausea, vomiting, SOB, myalgias, and sore throat since 1/31. He was found to have DKA and was volume resuscitated started on an insulin  infusion. After about 4L  IVF he developed respiratory failure and required bipap. IVF were stopped, He developed hypotension requiring vasopressors to be started. Flu A +. He was transferred to [facility] for admission.   Pertinent  Medical History  DM2 Obesity HTN  Significant Hospital Events: Including procedures, antibiotic start and stop dates in addition to other pertinent events   2/4 admitted dka on bipap; shock on pressors 2/5 transition off insulin  2/6 back on insulin  gtt. Off NE off vaso. Dc vanc   Interim History / Subjective:   Insulin  gtt restarted overnight   Renal fxn a bit worse today w Cr 3.56   Objective   Blood pressure (!) 85/64, pulse (!) 104, temperature 99.5 F (37.5 C), temperature source Axillary, resp. rate (!) 24, height 5' 10.5 (1.791 m), weight (!) 137.9 kg, SpO2 95%. CVP:  [0 mmHg-13 mmHg] 6 mmHg  Vent Mode: BIPAP;PCV FiO2 (%):  [40 %-50 %] 40 % Set Rate:  [14 bmp] 14 bmp PEEP:  [5 cmH20] 5 cmH20   Intake/Output Summary (Last 24 hours) at 08/09/2023 1039 Last data filed at 08/09/2023 9043 Gross per 24 hour  Intake 2012.14 ml  Output 600 ml  Net 1412.14 ml   Filed Weights   08/07/23 2315 08/08/23 0420 08/09/23 0500  Weight: 133.5 kg 133.5 kg (!) 137.9 kg    Examination: General:   obese middle aged M NAD  HEENT: NCAT pink mm anicteric sclera  Neuro: AAOx3 CV: tachy regular  s1s2 no rgm  PULM: diminished  GI: soft ndnt  Extremities: no acute joint deformity  Skin: c/d/w    Resolved Hospital Problem list     Shock  Assessment & Plan:    Acute resp failure w hypoxia Flu A PNA Possible superimposed bacterial CAP  P -wean O2 for goal > 92 -tamiflu  -dc vanc (MRSA PCR neg), cont azithro rocephin . Follow sputums. He received vanc dose 2/6 and was dosed renally, so if it needs to be restarted we have a good bit of time before next theoretical dose  -pulm hygiene,  IS, mobility  Shock -sepsis vs hypovolemia + / - acidosis -wean off NE   DKA  -hopefully wean off insulin  gtt again 2/6 -serial BMP, replace lytes PRN   AKI on CKD 2 NAGMA  P -trend renal indices uop   Hx HTN HLD -hold home antihypertensives, just weaned off pressors  -statin   Thrombocytopenia -follow CBC    Best Practice (right click and Reselect all SmartList Selections daily)   Diet/type: Regular consistency (see orders) DVT prophylaxis LMWH Pressure ulcer(s): N/A GI prophylaxis: N/A Lines: Central line and Arterial Line Foley:  N/A Code Status:  full code Last date of multidisciplinary goals of care discussion [2/5 patient and mother updated at bedside ]  Labs   CBC: Recent Labs  Lab 08/07/23 1035 08/07/23 1526 08/07/23 1935 08/08/23 0210 08/08/23 0459 08/09/23 0438  WBC 12.2*  --   --  9.3 11.1* 14.6*  HGB 17.5*  18.7* 16.3 16.7 15.4 14.5 13.2  HCT  55.2*  55.0* 48.0 49.0 45.7 43.3 39.1  MCV 94.4  --   --  88.4 88.4 88.1  PLT 227  --   --  178 180 137*    Basic Metabolic Panel: Recent Labs  Lab 08/08/23 0459 08/08/23 1006 08/08/23 1950 08/09/23 0002 08/09/23 0438  NA 134* 136 136 135 135  K 3.9 4.4 4.8 4.5 3.8  CL 104 105 106 106 107  CO2 18* 17* 15* 13* 17*  GLUCOSE 288* 143* 234* 341* 277*  BUN 19 21* 26* 27* 27*  CREATININE 2.63* 2.71* 3.23* 3.51* 3.56*  CALCIUM  8.8* 9.0 8.4* 8.1* 8.1*  MG 1.0* 1.2* 2.0  --  2.1  PHOS 1.1*  --   --   --  3.1   GFR: Estimated Creatinine Clearance: 38.5 mL/min (A) (by C-G formula based on SCr of 3.56 mg/dL (H)). Recent Labs  Lab  08/07/23 1035 08/07/23 2127 08/08/23 0015 08/08/23 0210 08/08/23 0459 08/08/23 1006 08/09/23 0438  PROCALCITON  --   --   --   --   --  32.41  --   WBC 12.2*  --   --  9.3 11.1*  --  14.6*  LATICACIDVEN  --  4.7* 2.2*  --   --   --   --     Liver Function Tests: No results for input(s): AST, ALT, ALKPHOS, BILITOT, PROT, ALBUMIN in the last 168 hours. No results for input(s): LIPASE, AMYLASE in the last 168 hours. No results for input(s): AMMONIA in the last 168 hours.  ABG    Component Value Date/Time   HCO3 10.1 (L) 08/07/2023 1935   TCO2 11 (L) 08/07/2023 1935   ACIDBASEDEF 15.0 (H) 08/07/2023 1935   O2SAT 58 08/07/2023 1935     Coagulation Profile: No results for input(s): INR, PROTIME in the last 168 hours.  Cardiac Enzymes: No results for input(s): CKTOTAL, CKMB, CKMBINDEX, TROPONINI in the last 168 hours.  HbA1C: Hemoglobin A1C  Date/Time Value Ref Range Status  10/09/2022 02:55 PM 6.0 (A) 4.0 - 5.6 % Final   Hgb A1c MFr Bld  Date/Time Value Ref Range Status  06/03/2023 12:26 AM 14.1 (H) 4.8 - 5.6 % Final    Comment:    (NOTE) Pre diabetes:          5.7%-6.4%  Diabetes:              >6.4%  Glycemic control for   <7.0% adults with diabetes   07/31/2022 04:39 PM 6.3 (H) 4.8 - 5.6 % Final    Comment:             Prediabetes: 5.7 - 6.4          Diabetes: >6.4          Glycemic control for adults with diabetes: <7.0     CBG: Recent Labs  Lab 08/09/23 0532 08/09/23 0624 08/09/23 0726 08/09/23 0835 08/09/23 0951  GLUCAP 244* 226* 204* 199* 160*    CRITICAL CARE Performed by: Ronnald FORBES Gave   Total critical care time: 37 minutes  Critical care time was exclusive of separately billable procedures and treating other patients. Critical care was necessary to treat or prevent imminent or life-threatening deterioration.  Critical care was time spent personally by me on the following activities: development of  treatment plan with patient and/or surrogate as well as nursing, discussions with consultants, evaluation of patient's response to treatment, examination of patient, obtaining history from patient or surrogate, ordering and performing treatments  and interventions, ordering and review of laboratory studies, ordering and review of radiographic studies, pulse oximetry and re-evaluation of patient's condition.  Ronnald Gave MSN, AGACNP-BC [place] [facility] Amion for pager 08/09/2023, 10:39 AM

## 2023-08-09 NOTE — Progress Notes (Signed)
 Received page from [facility]; this patient is stable to transfer out of the ICU. IMTS to assume care of this patient on 08/10/2023 at 7 AM.  Cathey Clunes, MD [facility] Internal Medicine Program - PGY-2 08/09/2023, 2:16 PM

## 2023-08-09 NOTE — Plan of Care (Signed)

## 2023-08-09 NOTE — Progress Notes (Signed)
 Pharmacy Antibiotic Note  Mark Schneider is a 43 y.o. male admitted on 08/07/2023 with pneumonia.  Pharmacy has been consulted for vancomycin  dosing.  Received vancomycin  2g total loading dose 2/5 AM.  SCr rising 2.63 > 2.71 > 3.56  Vancomycin  level this AM = 12 (goal 15-20)  Plan: Redose vancomycin  with 1500 mg q 36 hrs (calculated AUC 521 w/ Scr 3.56) Follow up renal function, cultures as available, clinical progress, length of tx  Height: 5' 10.5 (179.1 cm) Weight: (!) 137.9 kg (304 lb 0.2 oz) IBW/kg (Calculated) : 74.15  Temp (24hrs), Avg:99.5 F (37.5 C), Min:99 F (37.2 C), Max:99.8 F (37.7 C)  Recent Labs  Lab 08/07/23 1035 08/07/23 1353 08/07/23 2127 08/08/23 0015 08/08/23 0210 08/08/23 0459 08/08/23 1006 08/08/23 1950 08/09/23 0002 08/09/23 0438  WBC 12.2*  --   --   --  9.3 11.1*  --   --   --  14.6*  CREATININE 2.70*   < >  --   --  2.81* 2.63* 2.71* 3.23* 3.51* 3.56*  LATICACIDVEN  --   --  4.7* 2.2*  --   --   --   --   --   --   VANCORANDOM  --   --   --   --   --   --   --   --   --  12   < > = values in this interval not displayed.    Estimated Creatinine Clearance: 38.5 mL/min (A) (by C-G formula based on SCr of 3.56 mg/dL (H)).    Allergies  Allergen Reactions   Sheep-Derived Products Swelling   Shellfish Allergy Swelling    Antimicrobials this admission: Ceftriaxone  2/4 >>  Vancomycin  2/5 >>  Azithro 2/5 >> Tamiflu  >>  Dose adjustments this admission: N/a  Microbiology results: 2/4 BCx: ngtd 2/4 Resp panel: influenza A + 2/4 MRSA PCR: neg 2/5 Sputum: few GPC in singles  Thank you for allowing pharmacy to be a part of this patient's care.  Harlene Barlow, Berdine [employer]ONETTA [employer], BCCP Clinical Pharmacist  08/09/2023 8:08 AM   [facility] pharmacy phone numbers are listed on [email or web address]

## 2023-08-10 ENCOUNTER — Inpatient Hospital Stay (HOSPITAL_COMMUNITY): Payer: Self-pay

## 2023-08-10 DIAGNOSIS — J15211 Pneumonia due to Methicillin susceptible Staphylococcus aureus: Secondary | ICD-10-CM

## 2023-08-10 LAB — SODIUM, URINE, RANDOM: Sodium, Ur: 17 mmol/L

## 2023-08-10 LAB — BASIC METABOLIC PANEL
Anion gap: 14 (ref 5–15)
Anion gap: 10 (ref 5–15)
BUN: 33 mg/dL — ABNORMAL HIGH (ref 6–20)
BUN: 34 mg/dL — ABNORMAL HIGH (ref 6–20)
CO2: 17 mmol/L — ABNORMAL LOW (ref 22–32)
CO2: 19 mmol/L — ABNORMAL LOW (ref 22–32)
Calcium: 8.5 mg/dL — ABNORMAL LOW (ref 8.9–10.3)
Calcium: 8.6 mg/dL — ABNORMAL LOW (ref 8.9–10.3)
Chloride: 108 mmol/L (ref 98–111)
Chloride: 111 mmol/L (ref 98–111)
Creatinine, Ser: 4.25 mg/dL — ABNORMAL HIGH (ref 0.61–1.24)
Creatinine, Ser: 4.43 mg/dL — ABNORMAL HIGH (ref 0.61–1.24)
GFR, Estimated: 17 mL/min — ABNORMAL LOW (ref >60)
GFR, Estimated: 16 mL/min — ABNORMAL LOW (ref >60)
Glucose, Bld: 196 mg/dL — ABNORMAL HIGH (ref 70–99)
Glucose, Bld: 172 mg/dL — ABNORMAL HIGH (ref 70–99)
Potassium: 3.8 mmol/L (ref 3.5–5.1)
Potassium: 3.5 mmol/L (ref 3.5–5.1)
Sodium: 139 mmol/L (ref 135–145)
Sodium: 140 mmol/L (ref 135–145)

## 2023-08-10 LAB — CULTURE, RESPIRATORY W GRAM STAIN

## 2023-08-10 LAB — CBC
HCT: 35.3 % — ABNORMAL LOW (ref 39.0–52.0)
Hemoglobin: 11.8 g/dL — ABNORMAL LOW (ref 13.0–17.0)
MCH: 30 pg (ref 26.0–34.0)
MCHC: 33.4 g/dL (ref 30.0–36.0)
MCV: 89.8 fL (ref 80.0–100.0)
Platelets: 131 10*3/uL — ABNORMAL LOW (ref 150–400)
RBC: 3.93 MIL/uL — ABNORMAL LOW (ref 4.22–5.81)
RDW: 14.1 % (ref 11.5–15.5)
WBC: 14.3 10*3/uL — ABNORMAL HIGH (ref 4.0–10.5)
nRBC: 0 % (ref 0.0–0.2)

## 2023-08-10 LAB — GLUCOSE, CAPILLARY
Glucose-Capillary: 132 mg/dL — ABNORMAL HIGH (ref 70–99)
Glucose-Capillary: 151 mg/dL — ABNORMAL HIGH (ref 70–99)
Glucose-Capillary: 162 mg/dL — ABNORMAL HIGH (ref 70–99)
Glucose-Capillary: 175 mg/dL — ABNORMAL HIGH (ref 70–99)
Glucose-Capillary: 189 mg/dL — ABNORMAL HIGH (ref 70–99)
Glucose-Capillary: 205 mg/dL — ABNORMAL HIGH (ref 70–99)

## 2023-08-10 LAB — CREATININE, URINE, RANDOM: Creatinine, Urine: 104 mg/dL

## 2023-08-10 LAB — MAGNESIUM
Magnesium: 2.1 mg/dL (ref 1.7–2.4)
Magnesium: 2.1 mg/dL (ref 1.7–2.4)

## 2023-08-10 MED ORDER — LACTATED RINGERS IV SOLN: INTRAVENOUS | Status: AC

## 2023-08-10 MED ORDER — POTASSIUM CHLORIDE 20 MEQ PO PACK
20.0000 meq | PACK | Freq: Once | ORAL | Status: AC
  Administered 2023-08-10: 20 meq via ORAL
  Filled 2023-08-10: qty 1

## 2023-08-10 MED ORDER — OXYCODONE HCL 5 MG PO TABS
5.0000 mg | ORAL_TABLET | Freq: Four times a day (QID) | ORAL | Status: DC | PRN
  Administered 2023-08-10 – 2023-08-12 (×3): 5 mg via ORAL
  Filled 2023-08-10 (×4): qty 1

## 2023-08-10 MED ORDER — ACETAMINOPHEN 325 MG PO TABS
650.0000 mg | ORAL_TABLET | Freq: Four times a day (QID) | ORAL | Status: DC | PRN
  Administered 2023-08-10 – 2023-08-11 (×4): 650 mg via ORAL
  Filled 2023-08-10 (×5): qty 2

## 2023-08-10 MED ORDER — SODIUM BICARBONATE 650 MG PO TABS
650.0000 mg | ORAL_TABLET | Freq: Two times a day (BID) | ORAL | Status: AC
  Administered 2023-08-10 (×2): 650 mg via ORAL
  Filled 2023-08-10 (×2): qty 1

## 2023-08-10 MED ORDER — LIDOCAINE 5 % EX PTCH
1.0000 | MEDICATED_PATCH | CUTANEOUS | Status: DC
  Administered 2023-08-10 – 2023-08-12 (×3): 1 via TRANSDERMAL
  Filled 2023-08-10 (×3): qty 1

## 2023-08-10 MED ORDER — HEPARIN SODIUM (PORCINE) 5000 UNIT/ML IJ SOLN
5000.0000 [IU] | Freq: Three times a day (TID) | INTRAMUSCULAR | Status: DC
  Administered 2023-08-11 – 2023-08-13 (×7): 5000 [IU] via SUBCUTANEOUS
  Filled 2023-08-10 (×8): qty 1

## 2023-08-10 MED ORDER — LACTATED RINGERS IV BOLUS
1000.0000 mL | Freq: Once | INTRAVENOUS | Status: AC
  Administered 2023-08-10: 1000 mL via INTRAVENOUS

## 2023-08-10 NOTE — Inpatient Diabetes Management (Addendum)
 Inpatient Diabetes Program Recommendations  AACE/ADA: New Consensus Statement on Inpatient Glycemic Control (2015)  Target Ranges:  Prepandial:   less than 140 mg/dL      Peak postprandial:   less than 180 mg/dL (1-2 hours)      Critically ill patients:  140 - 180 mg/dL   Lab Results  Component Value Date   GLUCAP 189 (H) 08/10/2023   HGBA1C >15.5 (H) 08/08/2023    Review of Glycemic Control  Diabetes history: DM2 Outpatient Diabetes medications: Novolin  Relion 70/30 insulin  pen 25 units BID, Metformin  1000 mg BID Current orders for Inpatient glycemic control: Semglee  22 units BID, Novolog  3-9 units scale every 4 hours  Inpatient Diabetes Program Recommendations:   Patient has been requesting help with getting a blood glucose meter. Noted that patient does not have health insurance. Provided patient with the Relion [employer] home blood glucose meter kit to take home with him. Patient has been using [employer] 70/30 insulin  pens at home.   Will continue to follow and monitor blood sugars while in the hospital.   Marjorie Lunger RN BSN CDE Diabetes Coordinator Pager: [phone]  8am-5pm

## 2023-08-10 NOTE — TOC Initial Note (Signed)
 Transition of Care [facility]) - Initial/Assessment Note    Patient Details  Name: Mark Schneider MRN: [id number] Date of Birth: 1981/05/31  Transition of Care [facility]) CM/SW Contact:    Justina Delcia Czar, RN Phone Number: [phone] 08/10/2023, 4:51 PM  Clinical Narrative:                 Spoke to pt and gave permission to speak to mother, Shona. Pt lives at home with 2 teenage daughters. He works 2 part-time jobs. He will go to full-time soon. Pt does not have insurance. Referral sent to financial counselor for Medicaid screening. Pt has PCP. Will arrange hospital follow up with his PCP. Will assist pt with meds through [facility]. Will need a note for work.   Expected Discharge Plan: Home/Self Care Barriers to Discharge: Continued Medical Work up   Patient Goals and CMS Choice Patient states their goals for this hospitalization and ongoing recovery are:: wants to get better          Expected Discharge Plan and Services   Discharge Planning Services: CM Consult   Living arrangements for the past 2 months: Apartment                                      Prior Living Arrangements/Services Living arrangements for the past 2 months: Apartment Lives with:: Minor Children Patient language and need for interpreter reviewed:: Yes Do you feel safe going back to the place where you live?: Yes      Need for Family Participation in Patient Care: No (Comment) Care giver support system in place?: No (comment)   Criminal Activity/Legal Involvement Pertinent to Current Situation/Hospitalization: No - Comment as needed  Activities of Daily Living   ADL Screening (condition at time of admission) Independently performs ADLs?: Yes (appropriate for developmental age) Is the patient deaf or have difficulty hearing?: No Does the patient have difficulty seeing, even when wearing glasses/contacts?: No Does the patient have difficulty concentrating, remembering, or making decisions?:  No  Permission Sought/Granted Permission sought to share information with : Case Manager, Family Supports, PCP Permission granted to share information with : Yes, Verbal Permission Granted  Share Information with NAME: Shona Bunker     Permission granted to share info w Relationship: mother  Permission granted to share info w Contact Information: [phone]  Emotional Assessment Appearance:: Appears stated age Attitude/Demeanor/Rapport: Lethargic   Orientation: : Oriented to Self, Oriented to Place, Oriented to  Time, Oriented to Situation   Psych Involvement: No (comment)  Admission diagnosis:  DKA (diabetic ketoacidosis) (HCC) [E11.10] Influenza A [J10.1] DKA, type 2, not at goal [facility]) [E11.10] Septic shock (HCC) [A41.9, R65.21] Patient Active Problem List   Diagnosis Date Noted   Pneumonia due to methicillin susceptible Staphylococcus aureus (MSSA) (HCC) 08/10/2023   Influenza A 08/09/2023   Shock (HCC) 08/09/2023   DKA, type 2, not at goal [facility]) 08/07/2023   DKA (diabetic ketoacidosis) (HCC) 08/07/2023   Septic shock (HCC) 08/07/2023   Hyperosmolar hyperglycemic state (HHS) (HCC) 06/02/2023   Snoring 10/09/2022   Hyperlipidemia 04/12/2022   Hypertension associated with diabetes (HCC) 11/27/2019   Diabetes mellitus (HCC) 11/15/2017   AKI (acute kidney injury) (HCC) 11/15/2017   Body mass index (BMI) of 50-59.9 in adult (HCC) 11/15/2017   PCP:  Norrine Sharper, MD Pharmacy:   [facility] [phone] - [place], [place] - [address] AT CORNER OF [facility]  [address] [place] [address] Phone: [phone] Fax: [phone]  [facility] [address] [place] - [address] [place] [address] Phone: [phone] Fax: [phone]  [id number] [place], [place] - [address] AT [facility] OF [facility] [address] [place] [address] Phone:  [phone] Fax: [phone]  [facility] [place] [facility] [address] [place] [address] Phone: [phone] Fax: [phone]     Social Drivers of Health (SDOH) Social History: SDOH Screenings   Food Insecurity: No Food Insecurity (08/08/2023)  Housing: Low Risk  (08/08/2023)  Transportation Needs: No Transportation Needs (08/08/2023)  Utilities: Not At Risk (08/08/2023)  Depression (PHQ2-9): Low Risk  (10/09/2022)  Tobacco Use: Low Risk  (08/07/2023)   SDOH Interventions:     Readmission Risk Interventions     No data to display

## 2023-08-10 NOTE — Progress Notes (Signed)
 Unfortunately patient has not been able to be moved to a progressive bed from CVICU. As this is a closed unit, PCCM will remain primary until a progressive bed is assigned to the patient and he moves out of CVICU. IMTS will monitor for change in bed location and resume care at 7AM the morning following a successful transfer out of CVICU. This has been discussed with Ronnald Gave, NP, with PCCM.  Damien Hutchinson, DO Internal Medicine PGY-3

## 2023-08-10 NOTE — Plan of Care (Signed)

## 2023-08-10 NOTE — Progress Notes (Signed)
 RT took pt off heated HFNC and placed pt on HFNC (salter) 10L, sats 97%. Pt is tolerating it well at this time and is in no apparent distress. RN notified.

## 2023-08-10 NOTE — Progress Notes (Signed)
 NAME:  Mark Schneider, MRN:  [id number], DOB:  [birth year], LOS: 3 ADMISSION DATE:  08/07/2023, CONSULTATION DATE:  2/4 REFERRING MD:  Lawsing-ED, CHIEF COMPLAINT:  respiratory failure   History of Present Illness:  Mr. Mark Schneider is a 43 y/o gentleman with a history of DM who presented to the ED with nausea, vomiting, SOB, myalgias, and sore throat since 1/31. He was found to have DKA and was volume resuscitated started on an insulin  infusion. After about 4L  IVF he developed respiratory failure and required bipap. IVF were stopped, He developed hypotension requiring vasopressors to be started. Flu A +. He was transferred to [facility] for admission.   Pertinent  Medical History  DM2 Obesity HTN  Significant Hospital Events: Including procedures, antibiotic start and stop dates in addition to other pertinent events   2/4 admitted dka on bipap; shock on pressors 2/5 transition off insulin  2/6 back on insulin  gtt. Off NE off vaso. Dc vanc  2/7 worse renal fxn   Interim History / Subjective:   Worse renal fxn   Mom is at bedside   Weaning O2   Objective   Blood pressure 112/75, pulse 100, temperature 98.5 F (36.9 C), temperature source Oral, resp. rate (!) 23, height 5' 10.5 (1.791 m), weight (!) 137.6 kg, SpO2 97%. CVP:  [1 mmHg-18 mmHg] 5 mmHg  FiO2 (%):  [30 %-40 %] 31 %   Intake/Output Summary (Last 24 hours) at 08/10/2023 1126 Last data filed at 08/10/2023 1000 Gross per 24 hour  Intake 1501.88 ml  Output 800 ml  Net 701.88 ml   Filed Weights   08/08/23 0420 08/09/23 0500 08/10/23 0500  Weight: 133.5 kg (!) 137.9 kg (!) 137.6 kg    Examination: General:  Obese ill appearing middle aged M NAD  HEENT: NCAT R  jugular cvc  Neuro:  AAOx4 CV: tachy s1s2  PULM: diminished  GI: obese soft ndnt  Extremities: no acute joint deformity  Skin c/d/w   Resolved Hospital Problem list    Shock  Assessment & Plan:   Acute resp failure w hypoxia' Flu A PNA MSSA PNA  P -wean  O2 for goal > 92 -tamiflu  -CAP coverage  -pulm hygiene,  IS, mobilit  DKA  -serial BMP -cont IVF   AKI on CKD 2 NAGMA  -worse renal fxn 2/7, but makes urine which is assuring  P -trend renal indices uop  -add'l L LR the mIVF  -will do a couple doses of bicarb tabs -renal US , fena -possible nephro consult tomorrow pending course -will change lovenox  over to ppx hep   Hx HTN HLD -holding home antihypertensives  -statin   Thrombocytopenia -follow CBC PRN   Best Practice (right click and Reselect all SmartList Selections daily)   Diet/type: Regular consistency (see orders) DVT prophylaxis prophylactic heparin   Pressure ulcer(s): N/A GI prophylaxis: N/A Lines: CVC -- working on PIVs  Foley:  N/A Code Status:  full code Last date of multidisciplinary goals of care discussion [2/5 patient and mother updated at bedside ]  Labs   CBC: Recent Labs  Lab 08/07/23 1035 08/07/23 1526 08/07/23 1935 08/08/23 0210 08/08/23 0459 08/09/23 0438 08/10/23 0441  WBC 12.2*  --   --  9.3 11.1* 14.6* 14.3*  HGB 17.5*  18.7*   < > 16.7 15.4 14.5 13.2 11.8*  HCT 55.2*  55.0*   < > 49.0 45.7 43.3 39.1 35.3*  MCV 94.4  --   --  88.4 88.4 88.1 89.8  PLT  227  --   --  178 180 137* 131*   < > = values in this interval not displayed.    Basic Metabolic Panel: Recent Labs  Lab 08/08/23 0459 08/08/23 1006 08/08/23 1950 08/09/23 0002 08/09/23 0438 08/09/23 1709 08/10/23 0441  NA 134* 136 136 135 135 138 139  K 3.9 4.4 4.8 4.5 3.8 4.1 3.8  CL 104 105 106 106 107 109 108  CO2 18* 17* 15* 13* 17* 14* 17*  GLUCOSE 288* 143* 234* 341* 277* 201* 196*  BUN 19 21* 26* 27* 27* 30* 33*  CREATININE 2.63* 2.71* 3.23* 3.51* 3.56* 3.82* 4.25*  CALCIUM  8.8* 9.0 8.4* 8.1* 8.1* 7.9* 8.5*  MG 1.0* 1.2* 2.0  --  2.1 2.0 2.1  PHOS 1.1*  --   --   --  3.1  --   --    GFR: Estimated Creatinine Clearance: 32.2 mL/min (A) (by C-G formula based on SCr of 4.25 mg/dL (H)). Recent Labs  Lab  08/07/23 2127 08/08/23 0015 08/08/23 0210 08/08/23 0459 08/08/23 1006 08/09/23 0438 08/10/23 0441  PROCALCITON  --   --   --   --  32.41  --   --   WBC  --   --  9.3 11.1*  --  14.6* 14.3*  LATICACIDVEN 4.7* 2.2*  --   --   --   --   --     Liver Function Tests: No results for input(s): AST, ALT, ALKPHOS, BILITOT, PROT, ALBUMIN in the last 168 hours. No results for input(s): LIPASE, AMYLASE in the last 168 hours. No results for input(s): AMMONIA in the last 168 hours.  ABG    Component Value Date/Time   HCO3 10.1 (L) 08/07/2023 1935   TCO2 11 (L) 08/07/2023 1935   ACIDBASEDEF 15.0 (H) 08/07/2023 1935   O2SAT 58 08/07/2023 1935     Coagulation Profile: No results for input(s): INR, PROTIME in the last 168 hours.  Cardiac Enzymes: No results for input(s): CKTOTAL, CKMB, CKMBINDEX, TROPONINI in the last 168 hours.  HbA1C: Hgb A1c MFr Bld  Date/Time Value Ref Range Status  08/08/2023 02:10 AM >15.5 (H) 4.8 - 5.6 % Final    Comment:    (NOTE)         Prediabetes: 5.7 - 6.4         Diabetes: >6.4         Glycemic control for adults with diabetes: <7.0   06/03/2023 12:26 AM 14.1 (H) 4.8 - 5.6 % Final    Comment:    (NOTE) Pre diabetes:          5.7%-6.4%  Diabetes:              >6.4%  Glycemic control for   <7.0% adults with diabetes     CBG: Recent Labs  Lab 08/09/23 1629 08/09/23 2019 08/09/23 2346 08/10/23 0421 08/10/23 0755  GLUCAP 184* 205* 188* 175* 205*    CCT na   High mdm  Ronnald Gave MSN, AGACNP-BC [place] [facility] Medicine Amion for pager 08/10/2023, 12:34 PM

## 2023-08-11 DIAGNOSIS — R739 Hyperglycemia, unspecified: Secondary | ICD-10-CM

## 2023-08-11 DIAGNOSIS — E1165 Type 2 diabetes mellitus with hyperglycemia: Secondary | ICD-10-CM

## 2023-08-11 LAB — CBC
HCT: 37.5 % — ABNORMAL LOW (ref 39.0–52.0)
Hemoglobin: 12.3 g/dL — ABNORMAL LOW (ref 13.0–17.0)
MCH: 29.6 pg (ref 26.0–34.0)
MCHC: 32.8 g/dL (ref 30.0–36.0)
MCV: 90.4 fL (ref 80.0–100.0)
Platelets: 157 10*3/uL (ref 150–400)
RBC: 4.15 MIL/uL — ABNORMAL LOW (ref 4.22–5.81)
RDW: 14.1 % (ref 11.5–15.5)
WBC: 11.7 10*3/uL — ABNORMAL HIGH (ref 4.0–10.5)
nRBC: 0 % (ref 0.0–0.2)

## 2023-08-11 LAB — GLUCOSE, CAPILLARY
Glucose-Capillary: 134 mg/dL — ABNORMAL HIGH (ref 70–99)
Glucose-Capillary: 120 mg/dL — ABNORMAL HIGH (ref 70–99)
Glucose-Capillary: 143 mg/dL — ABNORMAL HIGH (ref 70–99)
Glucose-Capillary: 205 mg/dL — ABNORMAL HIGH (ref 70–99)
Glucose-Capillary: 119 mg/dL — ABNORMAL HIGH (ref 70–99)

## 2023-08-11 LAB — MAGNESIUM: Magnesium: 2 mg/dL (ref 1.7–2.4)

## 2023-08-11 LAB — BASIC METABOLIC PANEL
Anion gap: 11 (ref 5–15)
BUN: 32 mg/dL — ABNORMAL HIGH (ref 6–20)
CO2: 19 mmol/L — ABNORMAL LOW (ref 22–32)
Calcium: 8.5 mg/dL — ABNORMAL LOW (ref 8.9–10.3)
Chloride: 110 mmol/L (ref 98–111)
Creatinine, Ser: 4.43 mg/dL — ABNORMAL HIGH (ref 0.61–1.24)
GFR, Estimated: 16 mL/min — ABNORMAL LOW (ref >60)
Glucose, Bld: 116 mg/dL — ABNORMAL HIGH (ref 70–99)
Potassium: 3.7 mmol/L (ref 3.5–5.1)
Sodium: 140 mmol/L (ref 135–145)

## 2023-08-11 MED ORDER — POTASSIUM CHLORIDE CRYS ER 20 MEQ PO TBCR
20.0000 meq | EXTENDED_RELEASE_TABLET | Freq: Once | ORAL | Status: AC
  Administered 2023-08-11: 20 meq via ORAL
  Filled 2023-08-11: qty 1

## 2023-08-11 MED ORDER — INSULIN ASPART 100 UNIT/ML IJ SOLN: 5.0000 [IU] | Freq: Three times a day (TID) | INTRAMUSCULAR | Status: DC

## 2023-08-11 MED ORDER — INSULIN ASPART 100 UNIT/ML IJ SOLN
0.0000 [IU] | Freq: Three times a day (TID) | INTRAMUSCULAR | Status: DC
  Administered 2023-08-11: 5 [IU] via SUBCUTANEOUS
  Administered 2023-08-12: 2 [IU] via SUBCUTANEOUS
  Administered 2023-08-13: 3 [IU] via SUBCUTANEOUS

## 2023-08-11 MED ORDER — INSULIN ASPART 100 UNIT/ML IJ SOLN: 0.0000 [IU] | Freq: Every day | INTRAMUSCULAR | Status: DC

## 2023-08-11 MED ORDER — POTASSIUM CHLORIDE 20 MEQ PO PACK
20.0000 meq | PACK | Freq: Once | ORAL | Status: AC
  Administered 2023-08-11: 20 meq via ORAL
  Filled 2023-08-11: qty 1

## 2023-08-11 MED ORDER — SODIUM BICARBONATE 650 MG PO TABS
650.0000 mg | ORAL_TABLET | Freq: Two times a day (BID) | ORAL | Status: AC
Start: 2023-08-11 — End: 2023-08-11
  Administered 2023-08-11 (×2): 650 mg via ORAL
  Filled 2023-08-11 (×2): qty 1

## 2023-08-11 MED ORDER — SENNA 8.6 MG PO TABS
1.0000 | ORAL_TABLET | Freq: Every day | ORAL | Status: DC
  Administered 2023-08-11: 8.6 mg via ORAL
  Filled 2023-08-11 (×2): qty 1

## 2023-08-11 MED ORDER — INSULIN GLARGINE-YFGN 100 UNIT/ML ~~LOC~~ SOLN: 5.0000 [IU] | Freq: Three times a day (TID) | SUBCUTANEOUS | Status: DC

## 2023-08-11 MED ORDER — ORAL CARE MOUTH RINSE: 15.0000 mL | OROMUCOSAL | Status: DC | PRN

## 2023-08-11 NOTE — Progress Notes (Signed)
 Patient has arrived to the unit via wheelchair from [facility]. A/O x 4. No pain complained at this time. Placed on tele ST. Oriented patient to the room and staff. Education provided regarding safety precaution and patient verbalize understanding.

## 2023-08-11 NOTE — Progress Notes (Signed)
 Patient now in 2W27. IMTS will assume care at 7AM on 08/12/2023. Discussed with PCCM team.  Cathey Clunes, MD

## 2023-08-11 NOTE — Progress Notes (Signed)
 NAME:  Mark Schneider, MRN:  [id number], DOB:  October 15, 1980, LOS: 4 ADMISSION DATE:  08/07/2023, CONSULTATION DATE:  2/4 REFERRING MD:  Lawsing-ED, CHIEF COMPLAINT:  respiratory failure   History of Present Illness:  Mr. Mark Schneider is a 43 y/o gentleman with a history of DM who presented to the ED with nausea, vomiting, SOB, myalgias, and sore throat since 1/31. He was found to have DKA and was volume resuscitated started on an insulin  infusion. After about 4L  IVF he developed respiratory failure and required bipap. IVF were stopped, He developed hypotension requiring vasopressors to be started. Flu A +. He was transferred to [facility] for admission.   Pertinent  Medical History  DM2 Obesity HTN  Significant Hospital Events: Including procedures, antibiotic start and stop dates in addition to other pertinent events   2/4 admitted dka on bipap; shock on pressors 2/5 transition off insulin  2/6 back on insulin  gtt. Off NE off vaso. Dc vanc  2/7 worse renal fxn  2/8 incr UOP   Interim History / Subjective:   Weaning O2 Making much better urine   Objective   Blood pressure 114/68, pulse 92, temperature 99.4 F (37.4 C), temperature source Oral, resp. rate (!) 22, height 5' 10.5 (1.791 m), weight (!) 138 kg, SpO2 91%.        Intake/Output Summary (Last 24 hours) at 08/11/2023 9090 Last data filed at 08/11/2023 0600 Gross per 24 hour  Intake 2059.81 ml  Output 1800 ml  Net 259.81 ml   Filed Weights   08/09/23 0500 08/10/23 0500 08/11/23 0500  Weight: (!) 137.9 kg (!) 137.6 kg (!) 138 kg    Examination: General:  Obese ill appearing M  HEENT: NCAT redundant neck tissue  Neuro:  AAOx4  CV: tachy diminished tones  PULM: diminished  GI: obese soft ndnt  Extremities: no acute joint deformity  Skin: c/d/w    Resolved Hospital Problem list    Shock  Assessment & Plan:   Acute resp failure w hypoxia Flu A PNA MSSA PNA  P -wean O2 for goal > 92 -tamiflu  -rocephin , dc  azithro -pulm hygiene,  IS, mobility   DM2 -Semglee  22 units BID + SSI   AKI on CKD 2, ATN  NAGMA  Borderline hypokalemia  -renal US  w/o hydronephrosis  -FENa c/w intrinsic injury, would favor ATN, which also fits w current pattern of auto-diuresing we are starting to see  P -trend renal indices, UOP -encourage PO intake -on low rate mIVF in background, and a few doses of PO Bicarb    Hx HTN HLD -holding home antihypertensives -- his pressures here are normotensive, think closer to dc he will be able to resume  -statin   Thrombocytopenia -follow CBC PRN   Best Practice (right click and Reselect all SmartList Selections daily)   Diet/type: Regular consistency (see orders) DVT prophylaxis prophylactic heparin   Pressure ulcer(s): N/A GI prophylaxis: N/A Lines: CVC -- working on PIVs  Foley:  N/A Code Status:  full code Last date of multidisciplinary goals of care discussion [2/5 patient and mother updated at bedside ]  Labs   CBC: Recent Labs  Lab 08/08/23 0210 08/08/23 0459 08/09/23 0438 08/10/23 0441 08/11/23 0215  WBC 9.3 11.1* 14.6* 14.3* 11.7*  HGB 15.4 14.5 13.2 11.8* 12.3*  HCT 45.7 43.3 39.1 35.3* 37.5*  MCV 88.4 88.4 88.1 89.8 90.4  PLT 178 180 137* 131* 157    Basic Metabolic Panel: Recent Labs  Lab 08/08/23 0459 08/08/23 1006 08/09/23  9561 08/09/23 1709 08/10/23 0441 08/10/23 2152 08/11/23 0215  NA 134*   < > 135 138 139 140 140  K 3.9   < > 3.8 4.1 3.8 3.5 3.7  CL 104   < > 107 109 108 111 110  CO2 18*   < > 17* 14* 17* 19* 19*  GLUCOSE 288*   < > 277* 201* 196* 172* 116*  BUN 19   < > 27* 30* 33* 34* 32*  CREATININE 2.63*   < > 3.56* 3.82* 4.25* 4.43* 4.43*  CALCIUM  8.8*   < > 8.1* 7.9* 8.5* 8.6* 8.5*  MG 1.0*   < > 2.1 2.0 2.1 2.1 2.0  PHOS 1.1*  --  3.1  --   --   --   --    < > = values in this interval not displayed.   GFR: Estimated Creatinine Clearance: 30.9 mL/min (A) (by C-G formula based on SCr of 4.43 mg/dL (H)). Recent  Labs  Lab 08/07/23 2127 08/08/23 0015 08/08/23 0210 08/08/23 0459 08/08/23 1006 08/09/23 0438 08/10/23 0441 08/11/23 0215  PROCALCITON  --   --   --   --  32.41  --   --   --   WBC  --   --    < > 11.1*  --  14.6* 14.3* 11.7*  LATICACIDVEN 4.7* 2.2*  --   --   --   --   --   --    < > = values in this interval not displayed.    Liver Function Tests: No results for input(s): AST, ALT, ALKPHOS, BILITOT, PROT, ALBUMIN in the last 168 hours. No results for input(s): LIPASE, AMYLASE in the last 168 hours. No results for input(s): AMMONIA in the last 168 hours.  ABG    Component Value Date/Time   HCO3 10.1 (L) 08/07/2023 1935   TCO2 11 (L) 08/07/2023 1935   ACIDBASEDEF 15.0 (H) 08/07/2023 1935   O2SAT 58 08/07/2023 1935     Coagulation Profile: No results for input(s): INR, PROTIME in the last 168 hours.  Cardiac Enzymes: No results for input(s): CKTOTAL, CKMB, CKMBINDEX, TROPONINI in the last 168 hours.  HbA1C: Hgb A1c MFr Bld  Date/Time Value Ref Range Status  08/08/2023 02:10 AM >15.5 (H) 4.8 - 5.6 % Final    Comment:    (NOTE)         Prediabetes: 5.7 - 6.4         Diabetes: >6.4         Glycemic control for adults with diabetes: <7.0   06/03/2023 12:26 AM 14.1 (H) 4.8 - 5.6 % Final    Comment:    (NOTE) Pre diabetes:          5.7%-6.4%  Diabetes:              >6.4%  Glycemic control for   <7.0% adults with diabetes     CBG: Recent Labs  Lab 08/10/23 1543 08/10/23 1928 08/10/23 2352 08/11/23 0416 08/11/23 0828  GLUCAP 162* 151* 132* 134* 120*   Mod mdm  Ronnald Gave MSN, AGACNP-BC [place] [facility]/Critical Care Medicine Amion for pager 08/11/2023, 9:09 AM

## 2023-08-11 NOTE — Plan of Care (Signed)

## 2023-08-12 DIAGNOSIS — Z794 Long term (current) use of insulin: Secondary | ICD-10-CM

## 2023-08-12 DIAGNOSIS — E118 Type 2 diabetes mellitus with unspecified complications: Secondary | ICD-10-CM

## 2023-08-12 LAB — BASIC METABOLIC PANEL
Anion gap: 12 (ref 5–15)
BUN: 34 mg/dL — ABNORMAL HIGH (ref 6–20)
CO2: 14 mmol/L — ABNORMAL LOW (ref 22–32)
Calcium: 8.7 mg/dL — ABNORMAL LOW (ref 8.9–10.3)
Chloride: 115 mmol/L — ABNORMAL HIGH (ref 98–111)
Creatinine, Ser: 4.48 mg/dL — ABNORMAL HIGH (ref 0.61–1.24)
GFR, Estimated: 16 mL/min — ABNORMAL LOW (ref >60)
Glucose, Bld: 108 mg/dL — ABNORMAL HIGH (ref 70–99)
Potassium: 3.9 mmol/L (ref 3.5–5.1)
Sodium: 141 mmol/L (ref 135–145)

## 2023-08-12 LAB — GLUCOSE, CAPILLARY
Glucose-Capillary: 94 mg/dL (ref 70–99)
Glucose-Capillary: 104 mg/dL — ABNORMAL HIGH (ref 70–99)
Glucose-Capillary: 139 mg/dL — ABNORMAL HIGH (ref 70–99)
Glucose-Capillary: 123 mg/dL — ABNORMAL HIGH (ref 70–99)

## 2023-08-12 LAB — CBC
HCT: 42.4 % (ref 39.0–52.0)
Hemoglobin: 13.7 g/dL (ref 13.0–17.0)
MCH: 29.8 pg (ref 26.0–34.0)
MCHC: 32.3 g/dL (ref 30.0–36.0)
MCV: 92.4 fL (ref 80.0–100.0)
Platelets: 201 10*3/uL (ref 150–400)
RBC: 4.59 MIL/uL (ref 4.22–5.81)
RDW: 14.1 % (ref 11.5–15.5)
WBC: 9.2 10*3/uL (ref 4.0–10.5)
nRBC: 0 % (ref 0.0–0.2)

## 2023-08-12 MED ORDER — AMLODIPINE BESYLATE 10 MG PO TABS
10.0000 mg | ORAL_TABLET | Freq: Every day | ORAL | Status: DC
  Administered 2023-08-13: 10 mg via ORAL
  Filled 2023-08-12: qty 1

## 2023-08-12 MED ORDER — INSULIN GLARGINE-YFGN 100 UNIT/ML ~~LOC~~ SOLN
22.0000 [IU] | Freq: Every day | SUBCUTANEOUS | Status: DC
  Administered 2023-08-13: 22 [IU] via SUBCUTANEOUS
  Filled 2023-08-12: qty 0.22

## 2023-08-12 NOTE — Progress Notes (Addendum)
 HD#5 SUBJECTIVE:  Patient Summary: Mark Schneider is a 43 y.o. with a pertinent PMH of HTN, insulin  dependent diabetes mellitus, and CKD 3b who presented with shortness of breath, nausea, vomiting and admitted for DKA. While in ED he developed respiratory failure requiring BiPAP and shock. Was admitted to [facility] and started on norepinephrine  drip. Was found to have bilateral pneumonia with sputum culture growing MSSA and also Flu A+. Started on IV antibiotics and continued IVF resuscitation for DKA. Was weaned off of vasopressors and oxygen on 08/11/2023.      Overnight Events: Was care transferred to IMTS from PCCM at 0700.  Interim History: Patient was seen sitting upright. He reports feeling tired and weak. His breathing has improved and he denies any fever or chills. Still having productive cough, but denies shortness of breath. He was able to walk around the floor yesterday and plans to do so today once his family arrives. Patient reports good PO water intake and good urine output with no dysuria.    OBJECTIVE:  Vital Signs: Vitals:   08/11/23 2108 08/12/23 0041 08/12/23 0515 08/12/23 0731  BP: (!) 145/85 109/70 (!) 150/76 (!) 147/87  Pulse: (!) 103 96 98 (!) 105  Resp: 18 17 18 18   Temp: 100.1 F (37.8 C) 98.5 F (36.9 C) 99.2 F (37.3 C) 99.8 F (37.7 C)  TempSrc: Oral Oral Oral Oral  SpO2: 98% 99% 97% 98%  Weight:   (!) 146.5 kg   Height:       Supplemental O2: Room Air SpO2: 98 % O2 Flow Rate (L/min): 4 L/min FiO2 (%): 31 %  Filed Weights   08/10/23 0500 08/11/23 0500 08/12/23 0515  Weight: (!) 137.6 kg (!) 138 kg (!) 146.5 kg     Intake/Output Summary (Last 24 hours) at 08/12/2023 0827 Last data filed at 08/12/2023 0820 Gross per 24 hour  Intake 1070 ml  Output 800 ml  Net 270 ml   Net IO Since Admission: 9,948.27 mL [08/12/23 0827]  Physical Exam: Physical Exam Constitutional:      General: He is not in acute distress.    Appearance: He is obese.      Comments: Fatigued appearing  Cardiovascular:     Rate and Rhythm: Regular rhythm. Tachycardia present.  Pulmonary:     Effort: Pulmonary effort is normal. No accessory muscle usage.     Breath sounds: Normal breath sounds.     Comments: Productive cough  Abdominal:     General: Bowel sounds are normal.  Neurological:     Mental Status: He is alert.     Patient Lines/Drains/Airways Status     Active Line/Drains/Airways     Name Placement date Placement time Site Days   Peripheral IV 08/07/23 20 G Anterior;Left Wrist 08/07/23  1925  Wrist  5   Peripheral IV 08/10/23 22 G 1 Anterior;Right Forearm 08/10/23  1359  Forearm  2   Wound / Incision (Open or Dehisced) 08/08/23 Incision - Open;Non-pressure wound Groin Left 08/08/23  0954  Groin  4             ASSESSMENT/PLAN:  Assessment: Principal Problem:   DKA, type 2, not at goal [facility]) Active Problems:   DKA (diabetic ketoacidosis) (HCC)   Septic shock (HCC)   Influenza A   Shock (HCC)   Pneumonia due to methicillin susceptible Staphylococcus aureus (MSSA) (HCC)   Hyperglycemia  Patient Summary: Mark Schneider is a 43 y.o. with a pertinent PMH of HTN, insulin   dependent diabetes mellitus, and CKD 3b who presented with shortness of breath, nausea, vomiting and admitted for DKA. While in ED he developed respiratory failure requiring BiPAP and shock. Was admitted to [facility] and started on norepinephrine  drip. Was found to have bilateral pneumonia with sputum culture growing MSSA and also Flu A+. Started on IV antibiotics and continued IVF resuscitation for DKA. Was weaned off of vasopressors 2/6 and oxygen 2/8. Transferred to IMTS care on 2/9.      Plan: #DKA - resolved #Uncontrolled T2DM with A1c>15.5 At time of admission bicarb 11, AG 20, and BG>600. Anion gap closed with insulin  infusion 2/6. A1c > 15.5. Has received multiple bicarb supplements in ICU. Fasting BG 94 this morning. Will hold mealtime 5 units novolog  and  continue with AM 22 units semglee . Home regimen included metformin  1000 BID and insulin  70/30 (25 units) BID. Will continue to monitor BG and adjust insulin  regimen in anticipation of future discharge.  - Needs diabetes education - Goal BG 140-180 in hospital - Continue Semglee  22 units BID  - SSI at meals and bedtime - Held morning mealtime insulin  (novolog  5 units), will revaluate later today  - Hold home metformin  1000 mg BID  #Acute respiratory failure with hypoxia - resolved #MSSA pneumonia  #Influenza A infection  RVP positive of Influenza A and sputum culture showed MSSA. Patient doing well on room air. Completed 5 days of tamaflu this AM, completed 3 days azithromycin  on 2/8, and on day 6/7 of ceftriaxone . - Continue IV ceftriaxone  2g for 7 day course (6/7)   - Continue guaifenesin  10 mL TID for productive cough  #AKI on CKD 3b BUN and Cr gradually increased during hospitalization. Stable since 2/7. BUN 34 Cr 4.48. Best estimated baseline per chart review is BUN 20-25 Cr 1.7-1.8 (06/2023). In ICU was suspected ATN due to septic shock, may also be from vancomycin  use prior to antibiotic narrowing. Patient now off of IVF and reports good PO intake with good urine output, will continue to monitor closely.  - Trend BMP -  Strict I/Os  - If BUN and Cr continue to increase consult nephrology - Avoid nephrotoxic medications   Stable chronic conditions  HTN - BP elevated AM, continue to trend BP today and will consider restarting home amlodipine  10 mg daily.  HLD- Continue home rosuvastatin  10 mg daily    Best Practice: Diet: Diabetic diet IVF: Fluids: None, Rate: None VTE: heparin  injection 5,000 Units Start: 08/11/23 0600 SCDs Start: 08/08/23 9972 Code: Full AB: Ceftriaxone  Therapy Recs: Pending, DME: none Family Contact: Mother, to be notified. DISPO: Anticipated discharge  pending  to Home pending  medical workup and IV antibiotics .  Signature: Logan Dross  Medical  Student, MS3 Pager: [phone] 8:27 AM, 08/12/2023    Attestation for Student Documentation:  I personally was present and re-performed the history, physical exam and medical decision-making activities of this service and have verified that the service and findings are accurately documented in the student's note.  Elicia Sharper, DO 08/12/2023, 10:29 AM

## 2023-08-12 NOTE — Progress Notes (Signed)
   08/12/23 1955  BiPAP/CPAP/SIPAP  BiPAP/CPAP/SIPAP Pt Type Adult  BiPAP/CPAP/SIPAP V60  BiPAP/CPAP /SiPAP Vitals  Pulse Rate (!) 108  Resp 18  SpO2 94 %  MEWS Score/Color  MEWS Score 1  MEWS Score Color Green   Pt stated he doesn't wear BIPAP at home and doesn't wish to use BIPAP for the night

## 2023-08-12 NOTE — Plan of Care (Signed)
  Problem: Education: Goal: Knowledge of General Education information will improve Description: Including pain rating scale, medication(s)/side effects and non-pharmacologic comfort measures 08/12/2023 0458 by Dominick Larena LABOR, RN Outcome: Progressing 08/12/2023 0457 by Dominick Larena LABOR, RN Outcome: Progressing   Problem: Clinical Measurements: Goal: Respiratory complications will improve 08/12/2023 0458 by Dominick Larena LABOR, RN Outcome: Progressing 08/12/2023 0457 by Dominick Larena LABOR, RN Outcome: Progressing Goal: Cardiovascular complication will be avoided 08/12/2023 0458 by Dominick Larena LABOR, RN Outcome: Progressing 08/12/2023 0457 by Dominick Larena LABOR, RN Outcome: Progressing   Problem: Pain Managment: Goal: General experience of comfort will improve and/or be controlled 08/12/2023 0458 by Dominick Larena A, RN Outcome: Progressing 08/12/2023 0457 by Dominick Larena LABOR, RN Outcome: Progressing   Problem: Fluid Volume: Goal: Ability to maintain a balanced intake and output will improve 08/12/2023 0458 by Dominick Larena LABOR, RN Outcome: Progressing 08/12/2023 0457 by Dominick Larena LABOR, RN Outcome: Progressing   Problem: Metabolic: Goal: Ability to maintain appropriate glucose levels will improve Outcome: Progressing

## 2023-08-12 NOTE — Plan of Care (Signed)

## 2023-08-13 ENCOUNTER — Other Ambulatory Visit (HOSPITAL_COMMUNITY): Payer: Self-pay

## 2023-08-13 DIAGNOSIS — A4101 Sepsis due to Methicillin susceptible Staphylococcus aureus: Secondary | ICD-10-CM

## 2023-08-13 DIAGNOSIS — R6521 Severe sepsis with septic shock: Secondary | ICD-10-CM

## 2023-08-13 LAB — BASIC METABOLIC PANEL
Anion gap: 15 (ref 5–15)
BUN: 31 mg/dL — ABNORMAL HIGH (ref 6–20)
CO2: 14 mmol/L — ABNORMAL LOW (ref 22–32)
Calcium: 8.4 mg/dL — ABNORMAL LOW (ref 8.9–10.3)
Chloride: 109 mmol/L (ref 98–111)
Creatinine, Ser: 4.08 mg/dL — ABNORMAL HIGH (ref 0.61–1.24)
GFR, Estimated: 18 mL/min — ABNORMAL LOW (ref >60)
Glucose, Bld: 186 mg/dL — ABNORMAL HIGH (ref 70–99)
Potassium: 3.8 mmol/L (ref 3.5–5.1)
Sodium: 138 mmol/L (ref 135–145)

## 2023-08-13 LAB — CBC
HCT: 36.8 % — ABNORMAL LOW (ref 39.0–52.0)
Hemoglobin: 12.2 g/dL — ABNORMAL LOW (ref 13.0–17.0)
MCH: 29.8 pg (ref 26.0–34.0)
MCHC: 33.2 g/dL (ref 30.0–36.0)
MCV: 89.8 fL (ref 80.0–100.0)
Platelets: 256 10*3/uL (ref 150–400)
RBC: 4.1 MIL/uL — ABNORMAL LOW (ref 4.22–5.81)
RDW: 13.9 % (ref 11.5–15.5)
WBC: 12.5 10*3/uL — ABNORMAL HIGH (ref 4.0–10.5)
nRBC: 0.2 % (ref 0.0–0.2)

## 2023-08-13 LAB — GLUCOSE, CAPILLARY: Glucose-Capillary: 182 mg/dL — ABNORMAL HIGH (ref 70–99)

## 2023-08-13 MED ORDER — INSULIN ASPART 100 UNIT/ML FLEXPEN
3.0000 [IU] | PEN_INJECTOR | Freq: Three times a day (TID) | SUBCUTANEOUS | 11 refills | Status: DC
  Filled 2023-08-13: qty 3, 30d supply, fill #0

## 2023-08-13 MED ORDER — LANTUS SOLOSTAR 100 UNIT/ML ~~LOC~~ SOPN
24.0000 [IU] | PEN_INJECTOR | Freq: Every day | SUBCUTANEOUS | 11 refills | Status: DC
  Filled 2023-08-13: qty 6, 25d supply, fill #0

## 2023-08-13 MED ORDER — INSULIN PEN NEEDLE 31G X 8 MM MISC
1.0000 | Freq: Three times a day (TID) | 0 refills | Status: DC
Start: 2023-08-13 — End: 2023-12-04
  Filled 2023-08-13: qty 100, 30d supply, fill #0

## 2023-08-13 NOTE — Plan of Care (Signed)

## 2023-08-13 NOTE — Discharge Summary (Addendum)
 Name: Mark Schneider MRN: [id number] DOB: [birth year] 43 y.o. PCP: Adria Hopkins, MD  Date of Admission: 08/07/2023 10:04 AM Date of Discharge: 08/13/2023 08/13/23 Attending Physician: Dr.  Jarvis Mesa  DISCHARGE DIAGNOSIS:  Primary Problem: DKA, type 2, not at goal [facility])   Hospital Problems: Principal Problem:   DKA, type 2, not at goal [facility]) Active Problems:   DKA (diabetic ketoacidosis) (HCC)   Septic shock (HCC)   Influenza A   Shock (HCC)   Pneumonia due to methicillin susceptible Staphylococcus aureus (MSSA) (HCC)   Hyperglycemia   Sepsis due to methicillin susceptible Staphylococcus aureus (MSSA) with acute hypoxic respiratory failure and septic shock (HCC)    DISCHARGE MEDICATIONS:   Allergies as of 08/13/2023       Reactions   Sheep-derived Products Swelling   Shellfish Allergy Swelling        Medication List     STOP taking these medications    metFORMIN  1000 MG tablet Commonly known as: GLUCOPHAGE    NovoLIN  70/30 Kwikpen (70-30) 100 UNIT/ML KwikPen Generic drug: insulin  isophane & regular human KwikPen       TAKE these medications    amLODipine  10 MG tablet Commonly known as: NORVASC  Take 1 tablet (10 mg total) by mouth daily.   Blood Glucose Monitoring Suppl Devi 1 each by Does not apply route 3 (three) times daily. May dispense any manufacturer covered by patient's insurance.   BLOOD GLUCOSE TEST STRIPS Strp 1 each by Does not apply route 3 (three) times daily. Use as directed to check blood sugar. May dispense any manufacturer covered by patient's insurance and fits patient's device.   insulin  aspart 100 UNIT/ML FlexPen Commonly known as: NOVOLOG  Inject 3 Units into the skin 3 (three) times daily with meals. Check glucose before breakfast, lunch, and dinner   Insulin  Pen Needle 31G X 8 MM Misc Use to inject insulin  as directed What changed:  medication strength additional instructions   Lancet Device Misc 1 each by Does not apply  route 3 (three) times daily. May dispense any manufacturer covered by patient's insurance.   Lancets Misc 1 each by Does not apply route 3 (three) times daily. Use as directed to check blood sugar. May dispense any manufacturer covered by patient's insurance and fits patient's device.   Lantus  SoloStar 100 UNIT/ML Solostar Pen Generic drug: insulin  glargine Inject 24 Units into the skin daily. Start taking on: August 14, 2023   rosuvastatin  10 MG tablet Commonly known as: Crestor  Take 1 tablet (10 mg total) by mouth daily.               Discharge Care Instructions  (From admission, onward)           Start     Ordered   08/13/23 0000  Discharge wound care:       Comments: Cut piece of Aquacel Timm Foot # [phone]) and apply to left groin Q day, then cover with foam dressing.  Change foam dressing Q 3 days or PRN soiling   08/13/23 1119            DISPOSITION AND FOLLOW-UP:  [name] 43 y.o. with a pertinent PMH of HTN, insulin  dependent diabetes mellitus, and CKD 3b who presented with shortness of breath, nausea, vomiting and admitted for DKA. Developed acute hypoxic failure and was admitted to ICU where he was found to be Influenza A+ and have MSSA pneumonia. Weaned off vasopressors and oxygen, transferred to IMTS 2/9 and antibiotics completed inpatient. He  was discharged from [facility] in stable condition. At the hospital follow up visit please address:  T2DM with insulin : Discharged on 22 units long acting and 3 units mealtime with 1000 mg metformin  BID. Revaluate and titrate as needed. Also concern for ability to afford medication, please connect with financial assistance program. AKI on CKD: Baseline Cr 1.7-1.8, Cr 4.08 at discharge. Likely ATN due to septic shock or vancomycin . Repeat BMP to ensure resolution.    Follow-up Recommendations: Consults: none Labs: Basic Metabolic Profile and Blood Sugar Studies: None Medications:  Novolog  3 units TID with meals, Basaglar  24 units daily, metformin  1000 mg BID, Amlodipine  10 mg daily, Crestor  10 mg daily   Follow-up Appointments: [facility]: 08/20/2023  HOSPITAL COURSE:  Patient Summary: Patient Summary: Mark Schneider is a 43 y.o. with a pertinent PMH of HTN, insulin  dependent diabetes mellitus, and CKD 3b who presented with shortness of breath, nausea, vomiting and admitted for DKA. While in ED he developed respiratory failure requiring BiPAP and shock. Was admitted to [facility] and started on norepinephrine  drip. Was found to have bilateral pneumonia with sputum culture growing MSSA and also Flu A+. Started on IV antibiotics and continued IVF resuscitation for DKA. Was weaned off of vasopressors 2/6 and oxygen 2/8. Transferred to IMTS care on 2/9.      #DKA - resolved #Uncontrolled T2DM with A1c>15.5 At time of admission bicarb 11, AG 20, and BG>600. Anion gap closed with insulin  infusion 2/6. A1c > 15.5. Has received multiple bicarb supplements in ICU. Fasting BG 186 Morning of discharge.  Home regimen included metformin  1000 BID and insulin  70/30 (25 units) BID. Patient noted that he had difficulty affording refills on testing strips and lancets for his glucometer. Recently refilled and has 100 test supply. Discharged patient on 24 units Basaglar  and 3 units at mealtime. Will hold home metformin  1000 mg BID at time of discharge due to elevated creatinine and risk of lactic acidosis. Will need further titration outpatient and referral for financial assistance.   #Acute respiratory failure with hypoxia - resolved #MSSA pneumonia  #Influenza A infection  RVP positive of Influenza A and sputum culture showed MSSA. Patient doing well on room air. Completed 5 days of tamaflu on 2/9, days azithromycin  on 2/8, and 7 days of ceftriaxone  on 2/10.   #AKI on CKD 3b BUN and Cr gradually increased during hospitalization. Improved to 4.08 on day of discharge. Best  estimated baseline per chart review is BUN 20-25 Cr 1.7-1.8 (06/2023). In ICU was suspected ATN due to septic shock, may also be from vancomycin  use prior to antibiotic narrowing. Patient off of IVF prior to discharge and reports good PO intake with good urine output. May be contributing to metabolic acidosis with bicarb of 14. Would expect resolution of acidosis with resolution of AKI. Will need follow up outpatient to ensure resolution.     DISCHARGE INSTRUCTIONS:   Discharge Instructions     Call MD for:   Complete by: As directed    Call MD for:  difficulty breathing, headache or visual disturbances   Complete by: As directed    Call MD for:  extreme fatigue   Complete by: As directed    Call MD for:  hives   Complete by: As directed    Call MD for:  persistant dizziness or light-headedness   Complete by: As directed    Call MD for:  persistant nausea and vomiting   Complete by: As directed  Call MD for:  redness, tenderness, or signs of infection (pain, swelling, redness, odor or green/yellow discharge around incision site)   Complete by: As directed    Call MD for:  severe uncontrolled pain   Complete by: As directed    Call MD for:  temperature >100.4   Complete by: As directed    Diet - low sodium heart healthy   Complete by: As directed    Discharge instructions   Complete by: As directed    Mr. Zebastian, Beahan were hospitalized for DKA that was likely caused by an infection.  You were also found to have a kidney injury that was likely from sepsis while in the ICU.  You have received fluids, insulin , antibiotics, and blood pressure medications.  Your condition has stabilized, and we feel you are safe for discharge.  Please *STOP* taking the following medication:  -Novolin  70/30 25 units daily  -metformin  1000mg  until follow-up with internal medicine clinic  Please *START* taking the following medications:  -Semglee  24 units daily - NovoLog  3 units three times per day  before breakfast, lunch, and dinner. Please make sure to check you glucose before each of these meals.   Please continue taking your other medications as prescribed.  We have also put an order for pen needles for you. You have an appointment scheduled with [facility] on 2/17 with Dr. Alexander-Savino at 1:15 PM. If you have any questions, please call the clinic at [phone]. We are glad that you are feeling better!   Discharge wound care:   Complete by: As directed    Cut piece of Aquacel Timm Foot # [phone]) and apply to left groin Q day, then cover with foam dressing.  Change foam dressing Q 3 days or PRN soiling   Increase activity slowly   Complete by: As directed        SUBJECTIVE:  Patient was seen resting in bed this morning. He reports improved breathing and feels ready to go home. Cough is unchanged from yesterday. Denies fever and chills. Had 3 loose stools yesterday. Patient anticipates potential financial difficulties affording medications after discharge due to missing 2 weeks of work. Discharge Vitals:   BP (!) 105/94 (BP Location: Right Arm)   Pulse (!) 108   Temp 98.9 F (37.2 C) (Oral)   Resp 18   Ht 5' 10.5" (1.791 m)   Wt (!) 140.9 kg   SpO2 96%   BMI 43.95 kg/m   OBJECTIVE:  Physical Exam Vitals reviewed.  Constitutional:      General: He is not in acute distress.    Appearance: He is obese. He is not ill-appearing.  Cardiovascular:     Rate and Rhythm: Regular rhythm. Tachycardia present.     Heart sounds: Normal heart sounds.  Pulmonary:     Effort: Pulmonary effort is normal. No tachypnea or accessory muscle usage.     Breath sounds: Normal breath sounds.  Abdominal:     General: Bowel sounds are normal.     Palpations: Abdomen is soft. There is no mass.     Tenderness: There is no abdominal tenderness.  Neurological:     Mental Status: He is alert.      Pertinent Labs, Studies, and Procedures:     Latest Ref Rng & Units 08/13/2023    6:48 AM  08/12/2023    7:20 AM 08/11/2023    2:15 AM  CBC  WBC 4.0 - 10.5 K/uL 12.5  9.2  11.7   Hemoglobin  13.0 - 17.0 g/dL 16.1  09.6  04.5   Hematocrit 39.0 - 52.0 % 36.8  42.4  37.5   Platelets 150 - 400 K/uL 256  201  157        Latest Ref Rng & Units 08/13/2023    6:48 AM 08/12/2023    7:20 AM 08/11/2023    2:15 AM  CMP  Glucose 70 - 99 mg/dL 409  811  914   BUN 6 - 20 mg/dL 31  34  32   Creatinine 0.61 - 1.24 mg/dL 7.82  9.56  2.13   Sodium 135 - 145 mmol/L 138  141  140   Potassium 3.5 - 5.1 mmol/L 3.8  3.9  3.7   Chloride 98 - 111 mmol/L 109  115  110   CO2 22 - 32 mmol/L 14  14  19    Calcium  8.9 - 10.3 mg/dL 8.4  8.7  8.5     VAS US  LOWER EXTREMITY VENOUS (DVT) Result Date: 08/08/2023  Lower Venous DVT Study Patient Name:  YADIR KAMAN  Date of Exam:   08/08/2023 Medical Rec #: [id number]          Accession #:    [id number] Date of Birth: [birth year]          Patient Gender: M Patient Age:   51 years Exam Location:  [facility] Procedure:      VAS US  LOWER EXTREMITY VENOUS (DVT) Referring Phys: Autry Legions PAYNE --------------------------------------------------------------------------------  Indications: Pain, and Swelling.  Comparison Study: No priors. Performing Technologist: Florencia Hunter RDMS, RVT  Examination Guidelines: A complete evaluation includes B-mode imaging, spectral Doppler, color Doppler, and power Doppler as needed of all accessible portions of each vessel. Bilateral testing is considered an integral part of a complete examination. Limited examinations for reoccurring indications may be performed as noted. The reflux portion of the exam is performed with the patient in reverse Trendelenburg.  +---------+---------------+---------+-----------+----------+--------------+ RIGHT    CompressibilityPhasicitySpontaneityPropertiesThrombus Aging +---------+---------------+---------+-----------+----------+--------------+ CFV      Full           Yes      Yes                                  +---------+---------------+---------+-----------+----------+--------------+ SFJ      Full                                                        +---------+---------------+---------+-----------+----------+--------------+ FV Prox  Full                                                        +---------+---------------+---------+-----------+----------+--------------+ FV Mid   Full                                                        +---------+---------------+---------+-----------+----------+--------------+ FV DistalFull                                                        +---------+---------------+---------+-----------+----------+--------------+  PFV      Full                                                        +---------+---------------+---------+-----------+----------+--------------+ POP      Full           Yes      Yes                                 +---------+---------------+---------+-----------+----------+--------------+ PTV      Full                                                        +---------+---------------+---------+-----------+----------+--------------+ PERO     Full                                                        +---------+---------------+---------+-----------+----------+--------------+   +---------+---------------+---------+-----------+----------+--------------+ LEFT     CompressibilityPhasicitySpontaneityPropertiesThrombus Aging +---------+---------------+---------+-----------+----------+--------------+ CFV      Full           Yes      Yes                                 +---------+---------------+---------+-----------+----------+--------------+ SFJ      Full                                                        +---------+---------------+---------+-----------+----------+--------------+ FV Prox  Full                                                         +---------+---------------+---------+-----------+----------+--------------+ FV Mid   Full                                                        +---------+---------------+---------+-----------+----------+--------------+ FV DistalFull                                                        +---------+---------------+---------+-----------+----------+--------------+ PFV      Full                                                        +---------+---------------+---------+-----------+----------+--------------+  POP      Full           Yes      Yes                                 +---------+---------------+---------+-----------+----------+--------------+ PTV      Full                                                        +---------+---------------+---------+-----------+----------+--------------+ PERO     Full                                                        +---------+---------------+---------+-----------+----------+--------------+     Summary: BILATERAL: - No evidence of deep vein thrombosis seen in the lower extremities, bilaterally. -No evidence of popliteal cyst, bilaterally.   *See table(s) above for measurements and observations. Electronically signed by Genny Kid MD on 08/08/2023 at 5:55:28 PM.    Final    ECHOCARDIOGRAM COMPLETE Result Date: 08/08/2023    ECHOCARDIOGRAM REPORT   Patient Name:   KHALID BERNARDY Date of Exam: 08/08/2023 Medical Rec #:  [id number]         Height:       70.5 in Accession #:    [id number]        Weight:       294.3 lb Date of Birth:  27-Dec-1980         BSA:          2.473 m Patient Age:    42 years          BP:           111/85 mmHg Patient Gender: M                 HR:           105 bpm. Exam Location:  Inpatient Procedure: 2D Echo, Color Doppler and Cardiac Doppler Indications:    Shock (DKA)  History:        Patient has no prior history of Echocardiogram examinations.                 Risk Factors:Diabetes and Dyslipidemia.   Sonographer:    Sherline Distel Senior RDCS Referring Phys: [id number] Roselyn Yvana Samonte CLARK IMPRESSIONS  1. Left ventricular ejection fraction, by estimation, is 55 to 60%. The left ventricle has normal function. The left ventricle has no regional wall motion abnormalities.  2. Right ventricular systolic function is normal. The right ventricular size is normal.  3. The mitral valve is normal in structure. Trivial mitral valve regurgitation.  4. The aortic valve was not well visualized. Aortic valve regurgitation is not visualized. Aortic valve sclerosis is present, with no evidence of aortic valve stenosis. FINDINGS  Left Ventricle: Left ventricular ejection fraction, by estimation, is 55 to 60%. The left ventricle has normal function. The left ventricle has no regional wall motion abnormalities. The left ventricular internal cavity size was normal in size. There is  no left ventricular hypertrophy. Right Ventricle: The right ventricular size is normal. Right vetricular wall thickness was not assessed.  Right ventricular systolic function is normal. Left Atrium: Left atrial size was normal in size. Right Atrium: Right atrial size was normal in size. Pericardium: There is no evidence of pericardial effusion. Mitral Valve: The mitral valve is normal in structure. Trivial mitral valve regurgitation. Tricuspid Valve: The tricuspid valve is grossly normal. Tricuspid valve regurgitation is trivial. Aortic Valve: The aortic valve was not well visualized. Aortic valve regurgitation is not visualized. Aortic valve sclerosis is present, with no evidence of aortic valve stenosis. Pulmonic Valve: The pulmonic valve was not well visualized. Pulmonic valve regurgitation is not visualized. Aorta: The aortic root and ascending aorta are structurally normal, with no evidence of dilitation. IAS/Shunts: No atrial level shunt detected by color flow Doppler.  LEFT VENTRICLE PLAX 2D LVIDd:         4.00 cm      Diastology LVIDs:         3.20 cm      LV e'  medial:    7.94 cm/s LV PW:         1.10 cm      LV E/e' medial:  9.2 LV IVS:        1.00 cm      LV e' lateral:   10.20 cm/s LVOT diam:     1.80 cm      LV E/e' lateral: 7.2 LV SV:         50 LV SV Index:   20 LVOT Area:     2.54 cm  LV Volumes (MOD) LV vol d, MOD A2C: 87.2 ml LV vol d, MOD A4C: 114.0 ml LV vol s, MOD A2C: 43.6 ml LV vol s, MOD A4C: 49.1 ml LV SV MOD A2C:     43.6 ml LV SV MOD A4C:     114.0 ml LV SV MOD BP:      53.3 ml RIGHT VENTRICLE RV S prime:     12.50 cm/s TAPSE (M-mode): 1.6 cm LEFT ATRIUM             Index        RIGHT ATRIUM           Index LA diam:        2.90 cm 1.17 cm/m   RA Area:     12.20 cm LA Vol (A2C):   46.9 ml 18.97 ml/m  RA Volume:   28.00 ml  11.32 ml/m LA Vol (A4C):   39.9 ml 16.14 ml/m LA Biplane Vol: 43.1 ml 17.43 ml/m  AORTIC VALVE LVOT Vmax:   127.00 cm/s LVOT Vmean:  102.650 cm/s LVOT VTI:    0.196 m  AORTA Ao Root diam: 3.00 cm Ao Asc diam:  3.10 cm MITRAL VALVE MV Area (PHT): 2.87 cm    SHUNTS MV Decel Time: 264 msec    Systemic VTI:  0.20 m MV E velocity: 73.30 cm/s  Systemic Diam: 1.80 cm MV A velocity: 63.20 cm/s MV E/A ratio:  1.16 Ola Berger MD Electronically signed by Ola Berger MD Signature Date/Time: 08/08/2023/2:09:55 PM    Final    DG Chest Port 1 View Result Date: 08/08/2023 CLINICAL DATA:  Acute respiratory failure EXAM: PORTABLE CHEST 1 VIEW COMPARISON:  None Available. FINDINGS: Right internal jugular central venous catheter tip seen within the superior right atrium. Consolidation noted at the left lung base, similar to prior examination. Small bilateral pleural effusions suspected. No pneumothorax. Mild cardiomegaly is stable. Pulmonary vascularity is normal. IMPRESSION: 1. Right internal jugular central venous catheter tip  within the superior right atrium. No pneumothorax. 2. Stable left basilar consolidation. 3. Stable small bilateral pleural effusions. Electronically Signed   By: Worthy Heads M.D.   On: 08/08/2023 03:57   DG CHEST PORT 1  VIEW Result Date: 08/08/2023 CLINICAL DATA:  Central line placement EXAM: PORTABLE CHEST 1 VIEW COMPARISON:  08/07/2023 FINDINGS: Unchanged left basilar opacity. Right IJ approach central venous catheter tip at the cavoatrial junction. Right lung is clear. No pleural effusion or pneumothorax. IMPRESSION: Right IJ approach central venous catheter tip at the cavoatrial junction. No pneumothorax. Electronically Signed   By: Juanetta Nordmann M.D.   On: 08/08/2023 01:01   DG Chest Portable 1 View Result Date: 08/07/2023 CLINICAL DATA:  Hypoxia EXAM: PORTABLE CHEST 1 VIEW COMPARISON:  08/07/2023 FINDINGS: Rotated patient. Interim development of heterogeneous airspace disease at the left lung base. Stable cardiomediastinal silhouette allowing for rotation. No pleural effusion or pneumothorax IMPRESSION: Interim development of heterogeneous airspace disease at the left lung base, suspicious for pneumonia or aspiration. Electronically Signed   By: Esmeralda Hedge M.D.   On: 08/07/2023 20:02   DG Chest Portable 1 View Result Date: 08/07/2023 CLINICAL DATA:  Shortness of breath.?  Pneumonia EXAM: PORTABLE CHEST 1 VIEW COMPARISON:  06/02/2023. FINDINGS: Bilateral lung fields are clear. Bilateral costophrenic angles are clear. Normal cardio-mediastinal silhouette. No acute osseous abnormalities. The soft tissues are within normal limits. IMPRESSION: No active disease. Electronically Signed   By: Beula Brunswick M.D.   On: 08/07/2023 11:50     Signed: Deland Favre, MS3 Pager: [phone] 1:31 PM, 08/13/2023    Attestation for Student Documentation:  I personally was present and performed or re-performed the history, physical exam and medical decision-making activities of this service and have verified that the service and findings are accurately documented in the student's note.  Maxie Spaniel, MD 08/13/2023, 1:31 PM

## 2023-08-13 NOTE — Discharge Instructions (Addendum)
  Present this card to the pharmacy for $35 per month Basaglar  Insulin  Pen and $35 per month Humalog Insulin  Pen:

## 2023-08-13 NOTE — Progress Notes (Signed)
 MATCH MEDICATION ASSISTANCE CARD Pharmacies please call: [phone] for claim processing assistance.  Rx BIN: [id number] Rx Group: [id number] Rx PCN: [id number] Relationship Code: 1 Person Code: 01  Patient ID (MRN): [id number]    Patient Name: Mark Schneider    Patient DOB: [birth year]    Discharge Date: 08/13/2023    Expiration Date: 08/21/2023 (must be filled within 7 days of discharge)   Dear Mark Schneider have been approved to have the prescriptions written by your discharging physician filled through our [facility] (Medication Assistance Through [facility]) program. This program allows for a one-time (no refills) 34-day supply of selected medications for a low copay amount.  The copay is $3.00 per prescription. For instance, if you have one prescription, you will pay $3.00; for two prescriptions, you pay $6.00; for three prescriptions, you pay $9.00; and so on. Only certain pharmacies are participating in this program with [facility]. You will need to select one of the pharmacies from the attached lists and take your prescriptions, this letter, and your photo ID to one of the participating pharmacies.  We are excited that you are able to use the [facility] program to get your medications. These prescriptions must be filled within 7 days of hospital discharge or they will no longer be valid for the [facility] program. Should you have any problems with your prescriptions please contact your case management team member at [phone] for [facility] or [phone] for [facility].  Thank you, [facility]

## 2023-08-13 NOTE — Hospital Course (Addendum)
 Patient Summary: Mark Schneider is a 43 y.o. with a pertinent PMH of HTN, insulin  dependent diabetes mellitus, and CKD 3b who presented with shortness of breath, nausea, vomiting and admitted for DKA. While in ED he developed respiratory failure requiring BiPAP and shock. Was admitted to [facility] and started on norepinephrine  drip. Was found to have bilateral pneumonia with sputum culture growing MSSA and also Flu A+. Started on IV antibiotics and continued IVF resuscitation for DKA. Was weaned off of vasopressors 2/6 and oxygen 2/8. Transferred to IMTS care on 2/9.      #DKA - resolved #Uncontrolled T2DM with A1c>15.5 At time of admission bicarb 11, AG 20, and BG>600. Anion gap closed with insulin  infusion 2/6. A1c > 15.5. Has received multiple bicarb supplements in ICU. Fasting BG 186 Morning of discharge.  Home regimen included metformin  1000 BID and insulin  70/30 (25 units) BID. Patient noted that he had difficulty affording refills on testing strips and lancets for his glucometer. Recently refilled and has 100 test supply. Discharged patient on 24 units Basaglar  and 3 units at mealtime. Will hold home metformin  1000 mg BID at time of discharge due to elevated creatinine and risk of lactic acidosis. Will need further titration outpatient and referral for financial assistance.   #Acute respiratory failure with hypoxia - resolved #MSSA pneumonia  #Influenza A infection  RVP positive of Influenza A and sputum culture showed MSSA. Patient doing well on room air. Completed 5 days of tamaflu on 2/9, days azithromycin  on 2/8, and 7 days of ceftriaxone  on 2/10.   #AKI on CKD 3b BUN and Cr gradually increased during hospitalization. Improved to 4.08 on day of discharge. Best estimated baseline per chart review is BUN 20-25 Cr 1.7-1.8 (06/2023). In ICU was suspected ATN due to septic shock, may also be from vancomycin  use prior to antibiotic narrowing. Patient off of IVF prior to discharge and reports good PO  intake with good urine output. May be contributing to metabolic acidosis with bicarb of 14. Would expect resolution of acidosis with resolution of AKI. Will need follow up outpatient to ensure resolution.

## 2023-08-14 ENCOUNTER — Telehealth: Payer: Self-pay

## 2023-08-14 LAB — CULTURE, BLOOD (ROUTINE X 2)
Culture: NO GROWTH
Culture: NO GROWTH
Special Requests: ADEQUATE
Special Requests: ADEQUATE

## 2023-08-14 NOTE — Transitions of Care (Post Inpatient/ED Visit) (Signed)
   08/14/2023  Name: ERNST CUMPSTON MRN: [id number] DOB: 10-18-1980  Today's TOC FU Call Status: Today's TOC FU Call Status:: Unsuccessful Call (1st Attempt) (Patient requests a call back)  Attempted to reach the patient regarding the most recent Inpatient/ED visit. Patient was asleep, requested call back.  Follow Up Plan: Additional outreach attempts will be made to reach the patient to complete the Transitions of Care (Post Inpatient/ED visit) call.   Jodelle Gross RN, BSN, CCM [place]  [facility] Manager Population Health Direct Dial: [phone]  Fax: [phone]

## 2023-08-20 ENCOUNTER — Other Ambulatory Visit: Payer: Self-pay

## 2023-08-20 ENCOUNTER — Other Ambulatory Visit (HOSPITAL_COMMUNITY): Payer: Self-pay

## 2023-08-20 ENCOUNTER — Encounter: Payer: Self-pay | Admitting: Student

## 2023-08-20 ENCOUNTER — Ambulatory Visit: Payer: Self-pay | Admitting: Student

## 2023-08-20 VITALS — BP 148/84 | HR 112 | Temp 99.5°F | Ht 70.0 in | Wt 309.9 lb

## 2023-08-20 DIAGNOSIS — E785 Hyperlipidemia, unspecified: Secondary | ICD-10-CM

## 2023-08-20 DIAGNOSIS — E1159 Type 2 diabetes mellitus with other circulatory complications: Secondary | ICD-10-CM

## 2023-08-20 DIAGNOSIS — I152 Hypertension secondary to endocrine disorders: Secondary | ICD-10-CM

## 2023-08-20 DIAGNOSIS — Z794 Long term (current) use of insulin: Secondary | ICD-10-CM

## 2023-08-20 DIAGNOSIS — Z7984 Long term (current) use of oral hypoglycemic drugs: Secondary | ICD-10-CM

## 2023-08-20 DIAGNOSIS — J15211 Pneumonia due to Methicillin susceptible Staphylococcus aureus: Secondary | ICD-10-CM

## 2023-08-20 DIAGNOSIS — N170 Acute kidney failure with tubular necrosis: Secondary | ICD-10-CM | POA: Insufficient documentation

## 2023-08-20 DIAGNOSIS — N179 Acute kidney failure, unspecified: Secondary | ICD-10-CM

## 2023-08-20 MED ORDER — ROSUVASTATIN CALCIUM 10 MG PO TABS
10.0000 mg | ORAL_TABLET | Freq: Every day | ORAL | 11 refills | Status: DC
  Filled 2023-08-20 (×2): qty 30, 30d supply, fill #0

## 2023-08-20 MED ORDER — INSULIN ASPART 100 UNIT/ML FLEXPEN
3.0000 [IU] | PEN_INJECTOR | Freq: Three times a day (TID) | SUBCUTANEOUS | 11 refills | Status: DC
  Filled 2023-08-20: qty 15, 166d supply, fill #0
  Filled 2023-09-05: qty 3, 30d supply, fill #0

## 2023-08-20 MED ORDER — LANTUS SOLOSTAR 100 UNIT/ML ~~LOC~~ SOPN
24.0000 [IU] | PEN_INJECTOR | Freq: Every day | SUBCUTANEOUS | 11 refills | Status: DC
  Filled 2023-08-20: qty 15, 62d supply, fill #0
  Filled 2023-09-06: qty 6, 25d supply, fill #0
  Filled 2023-10-25: qty 15, 62d supply, fill #1

## 2023-08-20 NOTE — Assessment & Plan Note (Signed)
 Following up from most recent hospitalization. Admitted for septic shock and acute hypoxic respiratory failure 2/2 flu and MSSA PNA also had DKA. Was discharged on basaglar 24 units daily and Novolog 3 units TID. Reports that home glucose has been 121-11, 156. Appetite not back at baseline. Denies symptoms of hypoglycemia at this time.   -Cw Basaglar 24 units daily  -cw Novolog 3 units TID  -Bring glucometer readings at next visit -Add metformin if Cr is back to baseline -cw Resuvastatin 10mg   -Addendum: Cr is back to baseline. Will add on Meformin tapering 500mg  increases weekly until 1000mg  BID reached.  Stopping amlodipine and starting Losartan 25mg  daily. Patient instructed to check BP and symptoms. To call if BP >160 to increase dose.

## 2023-08-20 NOTE — Progress Notes (Signed)
 CC:  Chief Complaint  Patient presents with   Follow-up    Patient here for hospital follow up / productive cough / medication refill / foot referral   HPI:  [name] is a 43 y.o. male living with a history stated below and presents today for hospital follow-up. Please see problem based assessment and plan for additional details.  Past Medical History:  Diagnosis Date   Acute renal failure (ARF) (HCC) 11/15/2017   DM (diabetes mellitus) (HCC)    Hypertension     Current Outpatient Medications on File Prior to Visit  Medication Sig Dispense Refill   amLODipine (NORVASC) 10 MG tablet Take 1 tablet (10 mg total) by mouth daily. 30 tablet 0   Blood Glucose Monitoring Suppl DEVI 1 each by Does not apply route 3 (three) times daily. May dispense any manufacturer covered by patient's insurance. 1 each 0   Glucose Blood (BLOOD GLUCOSE TEST STRIPS) STRP 1 each by Does not apply route 3 (three) times daily. Use as directed to check blood sugar. May dispense any manufacturer covered by patient's insurance and fits patient's device. 100 strip 0   Insulin Pen Needle 31G X 8 MM MISC Use to inject insulin as directed 100 each 0   Lancet Device MISC 1 each by Does not apply route 3 (three) times daily. May dispense any manufacturer covered by patient's insurance. 1 each 0   Lancets MISC 1 each by Does not apply route 3 (three) times daily. Use as directed to check blood sugar. May dispense any manufacturer covered by patient's insurance and fits patient's device. 100 each 0   No current facility-administered medications on file prior to visit.    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 [employer]  Tobacco Use   Smoking status: Never   Smokeless tobacco: Never  Vaping Use   Vaping status: Never Used  Substance and Sexual Activity   Alcohol use: No   Drug use: Yes    Types: Marijuana   Sexual activity: Not Currently  Other Topics Concern   Not on file  Social History Narrative   Not on file   Social Drivers of Health   Financial Resource Strain: Not on file  Food Insecurity: No Food Insecurity (08/08/2023)   Hunger Vital Sign    Worried About Running Out of Food in the Last Year: Never true    Ran Out of Food in the Last Year: Never true  Transportation Needs: No Transportation Needs (08/08/2023)   PRAPARE - Administrator, Civil Service (Medical): No    Lack of Transportation (Non-Medical): No  Physical Activity: Not on file  Stress: Not on file  Social Connections: Not on file  Intimate Partner Violence: Not At Risk (08/08/2023)   Humiliation, Afraid, Rape, and Kick questionnaire    Fear of Current or Ex-Partner: No    Emotionally Abused: No    Physically Abused: No    Sexually Abused: No    Review of Systems:  ROS negative except for what is noted on the assessment and plan.  Vitals:   08/20/23 1331  BP: (!) 148/84  Pulse: (!) 112  Temp: 99.5 F (37.5 C)  TempSrc: Oral  SpO2: 97%  Weight: (!) 309 lb 13.9 oz (140.6 kg)  Height: 5\' 10"  (1.778 m)   Physical Exam: Constitutional: well-appearing, in no acute distress HENT: normocephalic atraumatic, mucous membranes moist Eyes: conjunctiva non-erythematous Cardiovascular: regular rate and rhythm, no m/r/g Pulmonary/Chest: normal work of breathing on room air, lungs clear to auscultation bilaterally Abdominal: soft, non-tender, non-distended MSK: normal bulk and tone Neurological: alert & oriented x 3, no focal deficit Skin: warm and dry Psych: normal mood and behavior  Assessment & Plan:    Patient discussed with Dr. Mikey Bussing  Hypertension associated with diabetes (HCC) Pt is hypertensive today at 148/84, on amlodipine 10mg  daily. Had ATN during hospitalization, holding losartan for now. May resume if Cr is back to baseline.   Diabetes mellitus (HCC) Following up from most recent hospitalization. Admitted for septic shock and acute hypoxic respiratory failure 2/2 flu and MSSA PNA also had DKA. Was discharged on basaglar 24 units daily and Novolog 3 units TID. Reports that home glucose has been 121-11, 156. Appetite not back at baseline. Denies symptoms of hypoglycemia at this time.   -Cw Basaglar 24 units daily  -cw Novolog 3 units TID  -Bring glucometer readings at next visit -Add metformin if Cr is back to baseline -cw Resuvastatin 10mg   -Addendum: Cr is back to baseline. Will add on Meformin tapering 500mg  increases weekly until 1000mg  BID reached.  Stopping amlodipine and starting Losartan 25mg  daily. Patient instructed to check BP and symptoms. To call if BP >160 to increase dose.  Pneumonia due to methicillin susceptible Staphylococcus aureus (MSSA) (HCC) Received antibiotics and steroids during hospitalization. Needed pressors. Currently cough is still present but has had no fevers. Has some nausea which he states is secondary to his pain. Has significant rib pain. Does not smoke and denies history of COPD. Not wheezing on exam. No sputum production. I do think this is a cough that may be residual from his infection. Has a history of sleep apnea but could not tolerate BiPap. Currently in the process of getting medicaid. Discussed that we should try to address his cough and revisit apnea when he improves given that currently he is uninsured and coverage for the machine would be difficult. He did not tolerate BiPap during hospitalization because he was SOB. Patient agrees to hold for the moment. He also endorses being weak after prolonged hospitalization and that he has two  jobs. Discussed that activity would be food for him to regain strength and to increase activity slowly as tolerated. Filled disability paperwork for him today so he can get breaks at work when needed should he get SOB and needs a break.   -Cw with dextromethorphan  -Humidifier, if using a hot shower to be careful of burns.  -Nasal spray for congestion -cw tylenol 1000mg  up to 4 times a day.   ATN (acute tubular necrosis) (HCC) Creatinine during hospitalization was 4.08 baseline is 1.7-1.8. Will check BMP at today's visit.   Manuela Neptune, MD [facility] Internal Medicine, PGY-1 Phone: [phone] Date 08/21/2023 Time 11:45 AM

## 2023-08-20 NOTE — Assessment & Plan Note (Signed)
 Pt is hypertensive today at 148/84, on amlodipine 10mg  daily. Had ATN during hospitalization, holding losartan for now. May resume if Cr is back to baseline.

## 2023-08-20 NOTE — Assessment & Plan Note (Addendum)
 Received antibiotics and steroids during hospitalization. Needed pressors. Currently cough is still present but has had no fevers. Has some nausea which he states is secondary to his pain. Has significant rib pain. Does not smoke and denies history of COPD. Not wheezing on exam. No sputum production. I do think this is a cough that may be residual from his infection. Has a history of sleep apnea but could not tolerate BiPap. Currently in the process of getting medicaid. Discussed that we should try to address his cough and revisit apnea when he improves given that currently he is uninsured and coverage for the machine would be difficult. He did not tolerate BiPap during hospitalization because he was SOB. Patient agrees to hold for the moment. He also endorses being weak after prolonged hospitalization and that he has two jobs. Discussed that activity would be food for him to regain strength and to increase activity slowly as tolerated. Filled disability paperwork for him today so he can get breaks at work when needed should he get SOB and needs a break.   -Cw with dextromethorphan  -Humidifier, if using a hot shower to be careful of burns.  -Nasal spray for congestion -cw tylenol 1000mg  up to 4 times a day.

## 2023-08-20 NOTE — Assessment & Plan Note (Signed)
 Creatinine during hospitalization was 4.08 baseline is 1.7-1.8. Will check BMP at today's visit.

## 2023-08-20 NOTE — Patient Instructions (Signed)
 Thank you, [name] for allowing [place] to provide your care today.   I have ordered the following labs for you:   Lab Orders         BMP8+Anion Gap      I have ordered the following medication/changed the following medications:   Stop the following medications: Medications Discontinued During This Encounter  Medication Reason   rosuvastatin (CRESTOR) 10 MG tablet Reorder   insulin glargine (LANTUS SOLOSTAR) 100 UNIT/ML Solostar Pen Reorder   insulin aspart (NOVOLOG) 100 UNIT/ML FlexPen Reorder     Start the following medications: Meds ordered this encounter  Medications   rosuvastatin (CRESTOR) 10 MG tablet    Sig: Take 1 tablet (10 mg total) by mouth daily.    Dispense:  30 tablet    Refill:  11    IMTP   insulin aspart (NOVOLOG) 100 UNIT/ML FlexPen    Sig: Inject 3 Units into the skin 3 (three) times daily with meals. Check glucose before breakfast, lunch, and dinner    Dispense:  15 mL    Refill:  11    IMTP   insulin glargine (LANTUS SOLOSTAR) 100 UNIT/ML Solostar Pen    Sig: Inject 24 Units into the skin daily.    Dispense:  15 mL    Refill:  11    IMTP     Follow up:  1 month     Remember:   Continue taking your insulin as prescribed.  Take your Crestor.  Continue to exercise    For your cough:   Continue taking your cough syrup, can take it every 6 hours  Mucinex for phlegm production  For your pain: Tylenol 1000mg  (2 extra strength tablets) can take every 6 hours as needed for no more than 2 weeks.   Please give [place] a call if you do spike a fever or head to the ED.   Should you have any questions or concerns please call the internal medicine clinic at [phone].    Manuela Neptune, MD [facility]

## 2023-08-21 ENCOUNTER — Other Ambulatory Visit (HOSPITAL_COMMUNITY): Payer: Self-pay

## 2023-08-21 LAB — BMP8+ANION GAP
Anion Gap: 20 mmol/L — ABNORMAL HIGH (ref 10.0–18.0)
BUN/Creatinine Ratio: 7 — ABNORMAL LOW (ref 9–20)
BUN: 11 mg/dL (ref 6–24)
CO2: 19 mmol/L — ABNORMAL LOW (ref 20–29)
Calcium: 8.6 mg/dL — ABNORMAL LOW (ref 8.7–10.2)
Chloride: 100 mmol/L (ref 96–106)
Creatinine, Ser: 1.57 mg/dL — ABNORMAL HIGH (ref 0.76–1.27)
Glucose: 132 mg/dL — ABNORMAL HIGH (ref 70–99)
Potassium: 4.1 mmol/L (ref 3.5–5.2)
Sodium: 139 mmol/L (ref 134–144)
eGFR: 56 mL/min/{1.73_m2} — ABNORMAL LOW (ref >59)

## 2023-08-21 MED ORDER — LOSARTAN POTASSIUM 25 MG PO TABS
25.0000 mg | ORAL_TABLET | Freq: Every day | ORAL | 11 refills | Status: DC
Start: 2023-08-21 — End: 2023-09-17
  Filled 2023-08-21 – 2023-08-24 (×2): qty 30, 30d supply, fill #0

## 2023-08-21 MED ORDER — METFORMIN HCL ER 500 MG PO TB24
ORAL_TABLET | ORAL | 0 refills | Status: DC
  Filled 2023-08-21 – 2023-08-24 (×2): qty 42, 23d supply, fill #0

## 2023-08-21 NOTE — Addendum Note (Signed)
 Addended by: Manuela Neptune on: 08/21/2023 11:45 AM   Modules accepted: Orders

## 2023-08-21 NOTE — Progress Notes (Signed)
 Spoke with patient about Cr being back at baseline. Amlodipine switched to Losartan, metformin started.

## 2023-08-24 ENCOUNTER — Other Ambulatory Visit (HOSPITAL_COMMUNITY): Payer: Self-pay

## 2023-08-24 ENCOUNTER — Other Ambulatory Visit: Payer: Self-pay

## 2023-08-29 NOTE — Progress Notes (Signed)
 Internal Medicine Clinic Attending  Case discussed with the resident at the time of the visit.  We reviewed the resident's history and exam and pertinent patient test results.  I agree with the assessment, diagnosis, and plan of care documented in the resident's note.

## 2023-09-05 ENCOUNTER — Other Ambulatory Visit: Payer: Self-pay

## 2023-09-06 ENCOUNTER — Other Ambulatory Visit: Payer: Self-pay

## 2023-09-17 ENCOUNTER — Other Ambulatory Visit (HOSPITAL_COMMUNITY): Payer: Self-pay

## 2023-09-17 ENCOUNTER — Encounter: Payer: Self-pay | Admitting: Student

## 2023-09-17 ENCOUNTER — Ambulatory Visit: Payer: Self-pay | Admitting: Student

## 2023-09-17 DIAGNOSIS — Z794 Long term (current) use of insulin: Secondary | ICD-10-CM

## 2023-09-17 DIAGNOSIS — E785 Hyperlipidemia, unspecified: Secondary | ICD-10-CM

## 2023-09-17 DIAGNOSIS — Z7984 Long term (current) use of oral hypoglycemic drugs: Secondary | ICD-10-CM

## 2023-09-17 DIAGNOSIS — E1159 Type 2 diabetes mellitus with other circulatory complications: Secondary | ICD-10-CM

## 2023-09-17 DIAGNOSIS — I152 Hypertension secondary to endocrine disorders: Secondary | ICD-10-CM

## 2023-09-17 MED ORDER — LOSARTAN POTASSIUM 50 MG PO TABS
50.0000 mg | ORAL_TABLET | Freq: Every day | ORAL | 11 refills | Status: DC
  Filled 2023-09-17: qty 30, 30d supply, fill #0

## 2023-09-17 MED ORDER — METFORMIN HCL 1000 MG PO TABS
1000.0000 mg | ORAL_TABLET | Freq: Two times a day (BID) | ORAL | 11 refills | Status: DC
  Filled 2023-09-17: qty 30, 15d supply, fill #0
  Filled 2023-09-17 – 2023-12-04 (×2): qty 60, 30d supply, fill #0

## 2023-09-17 MED ORDER — ROSUVASTATIN CALCIUM 10 MG PO TABS
10.0000 mg | ORAL_TABLET | Freq: Every day | ORAL | 11 refills | Status: DC
  Filled 2023-09-17: qty 30, 30d supply, fill #0

## 2023-09-17 NOTE — Assessment & Plan Note (Signed)
 Current insulin regimen is Basaglar 24 units in the morning, and NovoLog 3 units 3 times a day with meals, as well as metformin at 1000 mg twice a day.  He checks his sugars daily, and he states his fasting glucose is around 109, he has not seen a glucose level higher than 174, lower than 96.  Overall, seems that patient is doing well with his current insulin regimen.  He is not due for A1c check until next month, at that point can consider titrating some of his insulin (may start with the short acting).  Plan: - Continue Basaglar 24 units in the morning - Continue NovoLog 3 units 3 times a day with meals - Continue metformin at 1000 mg twice a day

## 2023-09-17 NOTE — Patient Instructions (Signed)
 Thank you so much for coming to the clinic today!   Today, we are going to increase your losartan to 50mg , please start taking it starting tomorrow morning! Your sugar's seem well in control, which is phenomenal! Keep doing what you're doing, and we'll see you in a month!  If you have any questions please feel free to the call the clinic at anytime at [phone]. It was a pleasure seeing you!  Best, Dr. Thomasene Ripple

## 2023-09-17 NOTE — Progress Notes (Signed)
 CC: Diabetes and hypertension follow up  HPI:  [name] is a 43 y.o. male living with a history stated below and presents today for diabetes and hypertension follow up. Please see problem based assessment and plan for additional details.  Past Medical History:  Diagnosis Date   Acute renal failure (ARF) (HCC) 11/15/2017   DM (diabetes mellitus) (HCC)    Hypertension     Current Outpatient Medications on File Prior to Visit  Medication Sig Dispense Refill   Blood Glucose Monitoring Suppl DEVI 1 each by Does not apply route 3 (three) times daily. May dispense any manufacturer covered by patient's insurance. 1 each 0   Glucose Blood (BLOOD GLUCOSE TEST STRIPS) STRP 1 each by Does not apply route 3 (three) times daily. Use as directed to check blood sugar. May dispense any manufacturer covered by patient's insurance and fits patient's device. 100 strip 0   insulin aspart (NOVOLOG) 100 UNIT/ML FlexPen Inject 3 Units into the skin 3 (three) times daily with meals. Check glucose before breakfast, lunch, and dinner 15 mL 11   insulin glargine (LANTUS SOLOSTAR) 100 UNIT/ML Solostar Pen Inject 24 Units into the skin daily. 15 mL 11   Insulin Pen Needle 31G X 8 MM MISC Use to inject insulin as directed 100 each 0   Lancet Device MISC 1 each by Does not apply route 3 (three) times daily. May dispense any manufacturer covered by patient's insurance. 1 each 0   Lancets MISC 1 each by Does not apply route 3 (three) times daily. Use as directed to check blood sugar. May dispense any manufacturer covered by patient's insurance and fits patient's device. 100 each 0   No current facility-administered medications on file prior to visit.    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 [employer]  Tobacco Use   Smoking status: Never   Smokeless tobacco: Never  Vaping Use   Vaping status: Never Used  Substance and Sexual Activity   Alcohol use: No   Drug use: Yes    Types: Marijuana   Sexual activity: Not Currently  Other Topics Concern   Not on file  Social History Narrative   Not on file   Social Drivers of Health   Financial Resource Strain: Not on file  Food Insecurity: No Food Insecurity (08/08/2023)   Hunger Vital Sign    Worried About Running Out of Food in the Last Year: Never true    Ran Out of Food in the Last Year: Never true  Transportation Needs: No Transportation Needs (08/08/2023)   PRAPARE - Administrator, Civil Service (Medical): No    Lack of Transportation (Non-Medical): No  Physical Activity: Not on file  Stress: Not on file  Social Connections: Not on file  Intimate Partner Violence: Not At Risk (08/08/2023)   Humiliation, Afraid, Rape, and Kick questionnaire    Fear of Current or Ex-Partner: No    Emotionally Abused: No    Physically Abused: No  Sexually Abused: No    Review of Systems: ROS negative except for what is noted on the assessment and plan.  Vitals:   09/17/23 1517  BP: (!) 148/96  Pulse: 97  SpO2: 98%  Weight: (!) 319 lb 14.4 oz (145.1 kg)  Height: 5\' 10"  (1.778 m)    Physical Exam: Constitutional: well-appearing male  in no acute distress Cardiovascular: regular rate and rhythm, no m/r/g Pulmonary/Chest: normal work of breathing on room air, lungs clear to auscultation bilaterally Abdominal: soft, non-tender, non-distended   Assessment & Plan:   Hypertension associated with diabetes [facility]) Patient presents for follow-up regarding his blood pressure.  His current regimen is losartan 25 mg.  His blood pressure today is  148/96, and he states this is similar to his home blood pressures.  He denies any chest pain, shortness of breath.  Will increase losartan to 50 mg I think he would benefit from this the most given his kidney disease as well.  Ideally would like to check his electrolyte and kidney function today again, however patient stated that his insurance kicks in in a couple weeks and will check at next follow-up.  Plan: - Increase losartan to 50 mg - Repeat BMP at next visit  Diabetes mellitus (HCC) Current insulin regimen is Basaglar 24 units in the morning, and NovoLog 3 units 3 times a day with meals, as well as metformin at 1000 mg twice a day.  He checks his sugars daily, and he states his fasting glucose is around 109, he has not seen a glucose level higher than 174, lower than 96.  Overall, seems that patient is doing well with his current insulin regimen.  He is not due for A1c check until next month, at that point can consider titrating some of his insulin (may start with the short acting).  Plan: - Continue Basaglar 24 units in the morning - Continue NovoLog 3 units 3 times a day with meals - Continue metformin at 1000 mg twice a day   Patient discussed with Dr. Kae Heller Mark Schneider, M.D. [facility], PGY-2 Pager: [phone] Date 09/17/2023 Time 4:16 PM

## 2023-09-17 NOTE — Assessment & Plan Note (Signed)
 Patient presents for follow-up regarding his blood pressure.  His current regimen is losartan 25 mg.  His blood pressure today is 148/96, and he states this is similar to his home blood pressures.  He denies any chest pain, shortness of breath.  Will increase losartan to 50 mg I think he would benefit from this the most given his kidney disease as well.  Ideally would like to check his electrolyte and kidney function today again, however patient stated that his insurance kicks in in a couple weeks and will check at next follow-up.  Plan: - Increase losartan to 50 mg - Repeat BMP at next visit

## 2023-09-20 ENCOUNTER — Other Ambulatory Visit: Payer: Self-pay

## 2023-09-24 NOTE — Progress Notes (Signed)
 Internal Medicine Clinic Attending  Case discussed with the resident at the time of the visit.  We reviewed the resident's history and exam and pertinent patient test results.  I agree with the assessment, diagnosis, and plan of care documented in the resident's note.

## 2023-10-18 ENCOUNTER — Other Ambulatory Visit: Payer: Self-pay | Admitting: Student

## 2023-10-18 ENCOUNTER — Encounter: Payer: Self-pay | Admitting: Internal Medicine

## 2023-10-18 DIAGNOSIS — E119 Type 2 diabetes mellitus without complications: Secondary | ICD-10-CM

## 2023-10-18 DIAGNOSIS — E1159 Type 2 diabetes mellitus with other circulatory complications: Secondary | ICD-10-CM

## 2023-10-18 DIAGNOSIS — E785 Hyperlipidemia, unspecified: Secondary | ICD-10-CM

## 2023-10-18 NOTE — Telephone Encounter (Signed)
 Copied from CRM [id number]. Topic: Clinical - Medication Refill >> Oct 18, 2023  8:27 AM Madelyne Schiff wrote: Most Recent Primary Care Visit:  Provider: Chrissie Coupe  Department: [facility]  Visit Type: OPEN ESTABLISHED  Date: 09/17/2023  Medication: insulin glargine (LANTUS SOLOSTAR) 100 UNIT/ML Solostar Pen losartan (COZAAR) 50 MG tablet  rosuvastatin (CRESTOR) 10 MG tablet       Has the patient contacted their pharmacy? No (Agent: If no, request that the patient contact the pharmacy for the refill. If patient does not wish to contact the pharmacy document the reason why and proceed with request.) (Agent: If yes, when and what did the pharmacy advise?)  Is this the correct pharmacy for this prescription? Yes If no, delete pharmacy and type the correct one.  This is the patient's preferred pharmacy:    [facility] [address], [address] [place] [address] Phone: [phone] Fax: [phone]     Is the patient out of the medication? No  Has the patient been seen for an appointment in the last year OR does the patient have an upcoming appointment? Yes  Can we respond through MyChart? No  Agent: Please be advised that Rx refills may take up to 3 business days. We ask that you follow-up with your pharmacy.

## 2023-10-22 ENCOUNTER — Other Ambulatory Visit: Payer: Self-pay

## 2023-10-22 MED ORDER — LOSARTAN POTASSIUM 50 MG PO TABS
50.0000 mg | ORAL_TABLET | Freq: Every day | ORAL | 11 refills | Status: DC
  Filled 2023-10-22: qty 30, 30d supply, fill #0
  Filled 2023-10-25 – 2023-12-04 (×2): qty 30, 30d supply, fill #1

## 2023-10-22 MED ORDER — ROSUVASTATIN CALCIUM 10 MG PO TABS
10.0000 mg | ORAL_TABLET | Freq: Every day | ORAL | 11 refills | Status: DC
Start: 2023-10-22 — End: 2024-01-21
  Filled 2023-10-22: qty 30, 30d supply, fill #0
  Filled 2023-12-04: qty 30, 30d supply, fill #1

## 2023-10-25 ENCOUNTER — Other Ambulatory Visit (HOSPITAL_COMMUNITY): Payer: Self-pay

## 2023-10-25 ENCOUNTER — Other Ambulatory Visit: Payer: Self-pay

## 2023-10-26 ENCOUNTER — Other Ambulatory Visit: Payer: Self-pay

## 2023-10-26 ENCOUNTER — Other Ambulatory Visit (HOSPITAL_COMMUNITY): Payer: Self-pay

## 2023-11-01 ENCOUNTER — Ambulatory Visit: Payer: Self-pay | Admitting: Internal Medicine

## 2023-11-01 VITALS — BP 177/97 | HR 96 | Temp 98.5°F | Ht 70.0 in | Wt 312.4 lb

## 2023-11-01 DIAGNOSIS — E785 Hyperlipidemia, unspecified: Secondary | ICD-10-CM

## 2023-11-01 DIAGNOSIS — E119 Type 2 diabetes mellitus without complications: Secondary | ICD-10-CM

## 2023-11-01 DIAGNOSIS — E1159 Type 2 diabetes mellitus with other circulatory complications: Secondary | ICD-10-CM

## 2023-11-01 DIAGNOSIS — I152 Hypertension secondary to endocrine disorders: Secondary | ICD-10-CM

## 2023-11-01 DIAGNOSIS — Z794 Long term (current) use of insulin: Secondary | ICD-10-CM

## 2023-11-01 DIAGNOSIS — Z7984 Long term (current) use of oral hypoglycemic drugs: Secondary | ICD-10-CM

## 2023-11-01 LAB — POCT GLYCOSYLATED HEMOGLOBIN (HGB A1C): Hemoglobin A1C: 5.9 % — AB (ref 4.0–5.6)

## 2023-11-01 LAB — GLUCOSE, CAPILLARY: Glucose-Capillary: 134 mg/dL — ABNORMAL HIGH (ref 70–99)

## 2023-11-01 MED ORDER — HYDROCHLOROTHIAZIDE 12.5 MG PO CAPS: 12.5000 mg | ORAL_CAPSULE | Freq: Every day | ORAL | 0 refills | Status: DC

## 2023-11-01 NOTE — Patient Instructions (Signed)
 It was a pleasure to care for you today!  Congratulations on improving your HbA1c! I would like you to come back in the next 1-2 weeks and bring your glucometer so that we can see what your blood sugar levels are like throughout the day and better adjust your insulin  doses. Please check your sugar before each meal.  I will reach out to our clinical pharmacist to see about patient assistance for a diabetes medication called Jardiance . You may receive a call from her about this!  I have added a blood pressure medicine called hydrochlorothiazide . You will take one tablet daily with your losartan .   Bring your home blood pressure machine and glucometer to your next visit.  My best, Dr. Rozelle Corning

## 2023-11-01 NOTE — Progress Notes (Signed)
   CC: diabetes f/u  HPI:  [name] is a 43 y.o. male with past medical history as detailed below who presents for diabetes f/u. Please see problem based charting for detailed assessment and plan.  Past Medical History:  Diagnosis Date   Acute renal failure (ARF) (HCC) 11/15/2017   DM (diabetes mellitus) (HCC)    Hypertension    Review of Systems:  Negative unless otherwise stated.  Physical Exam:  Vitals:   11/01/23 1558 11/01/23 1608  BP: (!) 182/97 (!) 177/97  Pulse: 95 96  Temp:  98.5 F (36.9 C)  TempSrc:  Oral  SpO2:  99%  Weight:  (!) 312 lb 6.4 oz (141.7 kg)  Height:  5\' 10"  (1.778 m)   Constitutional:Well appearing, in no acute distress. Pulm:Normal work of breathing on room air. ZOX:WRUEAVWU for extremity edema. Skin:Warm and dry. Neuro:Alert and oriented x3. No focal deficit noted. Psych:Pleasant mood and affect.  Assessment & Plan:   See Encounters Tab for problem based charting.  Diabetes mellitus (HCC) HbA1c last checked 08/2023 >15.5%, today 5.9%. OP regimen is basaglar  24 units daily, novolog  3 units TID with meals, metformin  1000 mg BID. He does check his blood sugar at home and the highest reading he has had is 178; fasting typically runs 110s-120s, and lowest of 91. Urine microalbumin/creatinine ratio last checked 07/2022 moderately increased at 37. Last eye exam was within the last year. Plan: Urine microalbumin/creatinine ratio at next OV as he was unable to produce urine sample. Continue current regimen.. Will reach out to [facility] for patient assistance with Jardiance .  Hypertension associated with diabetes (HCC) BP 177/97, on recheck 182/97. OP regimen is losartan  50 mg daily (dose increased at last OV) which he is compliant with and took today. He does not check his BP at home. BMP last checked 08/2023 with renal function in CKD stage 3a range.  Plan:BMP today. Will prescribe hydrochlorothiazide  12.5 mg daily and continue losartan  50 mg  daily. If this combination works well with BP control, will send combination pill moving forward. Will have him start checking his BP at home and bring log to next visit.  Hyperlipidemia OP regimen is rosuvastatin  10 mg daily which he is compliant with. Lipid panel last checked 02/2020 with LDL above goal at 65.  Plan:Lipid panel at next OV. Continue rosuvastatin  10 mg daily.  Patient discussed with Dr. Narendra

## 2023-11-02 NOTE — Assessment & Plan Note (Signed)
 HbA1c last checked 08/2023 >15.5%, today 5.9%. OP regimen is basaglar  24 units daily, novolog  3 units TID with meals, metformin  1000 mg BID. He does check his blood sugar at home and the highest reading he has had is 178; fasting typically runs 110s-120s, and lowest of 91. Urine microalbumin/creatinine ratio last checked 07/2022 moderately increased at 37. Last eye exam was within the last year. Plan: Urine microalbumin/creatinine ratio at next OV as he was unable to produce urine sample. Continue current regimen.. Will reach out to [facility] for patient assistance with Jardiance .

## 2023-11-02 NOTE — Assessment & Plan Note (Addendum)
 OP regimen is rosuvastatin  10 mg daily which he is compliant with. Lipid panel last checked 02/2020 with LDL above goal at 65.  Plan:Lipid panel at next OV. Continue rosuvastatin  10 mg daily.

## 2023-11-02 NOTE — Assessment & Plan Note (Signed)
 BP 177/97, on recheck 182/97. OP regimen is losartan  50 mg daily (dose increased at last OV) which he is compliant with and took today. He does not check his BP at home. BMP last checked 08/2023 with renal function in CKD stage 3a range.  Plan:BMP today. Will prescribe hydrochlorothiazide  12.5 mg daily and continue losartan  50 mg daily. If this combination works well with BP control, will send combination pill moving forward. Will have him start checking his BP at home and bring log to next visit.

## 2023-11-03 LAB — BMP8+ANION GAP
Anion Gap: 15 mmol/L (ref 10.0–18.0)
BUN/Creatinine Ratio: 13 (ref 9–20)
BUN: 16 mg/dL (ref 6–24)
CO2: 22 mmol/L (ref 20–29)
Calcium: 9.3 mg/dL (ref 8.7–10.2)
Chloride: 107 mmol/L — ABNORMAL HIGH (ref 96–106)
Creatinine, Ser: 1.25 mg/dL (ref 0.76–1.27)
Glucose: 123 mg/dL — ABNORMAL HIGH (ref 70–99)
Potassium: 4.2 mmol/L (ref 3.5–5.2)
Sodium: 144 mmol/L (ref 134–144)
eGFR: 74 mL/min/{1.73_m2} (ref >59)

## 2023-11-05 ENCOUNTER — Encounter: Payer: Self-pay | Admitting: Internal Medicine

## 2023-11-05 NOTE — Progress Notes (Signed)
 Internal Medicine Clinic Attending  Case discussed with the resident at the time of the visit.  We reviewed the resident's history and exam and pertinent patient test results.  I agree with the assessment, diagnosis, and plan of care documented in the resident's note.

## 2023-11-08 ENCOUNTER — Other Ambulatory Visit: Payer: Self-pay | Admitting: Internal Medicine

## 2023-11-08 DIAGNOSIS — E119 Type 2 diabetes mellitus without complications: Secondary | ICD-10-CM

## 2023-11-19 ENCOUNTER — Telehealth: Payer: Self-pay | Admitting: Licensed Clinical Social Worker

## 2023-11-19 ENCOUNTER — Institutional Professional Consult (permissible substitution): Payer: Self-pay | Admitting: Licensed Clinical Social Worker

## 2023-11-19 ENCOUNTER — Encounter: Payer: Self-pay | Admitting: Internal Medicine

## 2023-11-19 DIAGNOSIS — Z7189 Other specified counseling: Secondary | ICD-10-CM

## 2023-11-19 NOTE — Telephone Encounter (Signed)
 [facility] attempted patient on today. Patient was scheduled for in-person visit at 3:30 pm. [facility] left a brief VM for patient to return [facility] call.   Amie Bald, MSW, LCSW-A [facility]Her [facility] Clinician [facility]  Internal Medicine Center Direct Dial:[phone]  Fax [phone] Main Office Phone: [phone] [address] [place] [place], [place] [address] Website: [facility] Internal Medicine [facility]  [place], [place]  [place]

## 2023-11-19 NOTE — Progress Notes (Deleted)
   CC: 2 week follow up  HPI:  [name] is a 43 y.o. with medical history of HTN, HLD, DMII presenting to [facility] for 2 week follow up for DMII and HTN.   Please see problem-based list for further details, assessments, and plans.  Past Medical History:  Diagnosis Date   Acute renal failure (ARF) (HCC) 11/15/2017   DM (diabetes mellitus) (HCC)    Hypertension     Current Outpatient Medications (Endocrine & Metabolic):    insulin  aspart (NOVOLOG ) 100 UNIT/ML FlexPen, Inject 3 Units into the skin 3 (three) times daily with meals. Check glucose before breakfast, lunch, and dinner   insulin  glargine (LANTUS  SOLOSTAR) 100 UNIT/ML Solostar Pen, Inject 24 Units into the skin daily.   metFORMIN  (GLUCOPHAGE ) 1000 MG tablet, Take 1 tablet (1,000 mg total) by mouth 2 (two) times daily with a meal.  Current Outpatient Medications (Cardiovascular):    hydrochlorothiazide  (MICROZIDE ) 12.5 MG capsule, Take 1 capsule (12.5 mg total) by mouth daily.   losartan  (COZAAR ) 50 MG tablet, Take 1 tablet (50 mg total) by mouth daily.   rosuvastatin  (CRESTOR ) 10 MG tablet, Take 1 tablet (10 mg total) by mouth daily.     Current Outpatient Medications (Other):    Blood Glucose Monitoring Suppl DEVI, 1 each by Does not apply route 3 (three) times daily. May dispense any manufacturer covered by patient's insurance.   Glucose Blood (BLOOD GLUCOSE TEST STRIPS) STRP, 1 each by Does not apply route 3 (three) times daily. Use as directed to check blood sugar. May dispense any manufacturer covered by patient's insurance and fits patient's device.   Insulin  Pen Needle 31G X 8 MM MISC, Use to inject insulin  as directed   Lancet Device MISC, 1 each by Does not apply route 3 (three) times daily. May dispense any manufacturer covered by patient's insurance.   Lancets MISC, 1 each by Does not apply route 3 (three) times daily. Use as directed to check blood sugar. May dispense any manufacturer covered by patient's  insurance and fits patient's device.  Review of Systems:  Review of system negative unless stated in the problem list or HPI.    Physical Exam:  There were no vitals filed for this visit. Physical Exam General: NAD HENT: NCAT Lungs: CTAB, no wheeze, rhonchi or rales.  Cardiovascular: Normal heart sounds, no r/m/g, 2+ pulses in all extremities. No LE edema Abdomen: No TTP, normal bowel sounds MSK: No asymmetry or muscle atrophy.  Skin: no lesions noted on exposed skin Neuro: Alert and oriented x4. CN grossly intact Psych: Normal mood and normal affect   Assessment & Plan:   No problem-specific Assessment & Plan notes found for this encounter.   See Encounters Tab for problem based charting.  Patient Discussed with Dr. {NAMES:[id number]::"Guilloud","Hoffman","Mullen","Narendra","Vincent","Machen","Lau","Hatcher","Williams"} Jackolyn Masker, MD Tommas Fragmin. [facility] Internal Medicine Residency, PGY-3   Care Gaps ACR PNA Hep C Eye visit

## 2023-12-04 ENCOUNTER — Other Ambulatory Visit: Payer: Self-pay

## 2023-12-04 ENCOUNTER — Other Ambulatory Visit: Payer: Self-pay | Admitting: Student

## 2023-12-04 ENCOUNTER — Other Ambulatory Visit (HOSPITAL_COMMUNITY): Payer: Self-pay

## 2023-12-04 DIAGNOSIS — E1159 Type 2 diabetes mellitus with other circulatory complications: Secondary | ICD-10-CM

## 2023-12-04 DIAGNOSIS — E785 Hyperlipidemia, unspecified: Secondary | ICD-10-CM

## 2023-12-04 MED ORDER — HYDROCHLOROTHIAZIDE 12.5 MG PO CAPS
12.5000 mg | ORAL_CAPSULE | Freq: Every day | ORAL | 0 refills | Status: DC
  Filled 2023-12-04: qty 30, 30d supply, fill #0

## 2023-12-04 MED ORDER — INSULIN PEN NEEDLE 31G X 8 MM MISC
1.0000 | Freq: Three times a day (TID) | 0 refills | Status: AC
  Filled 2023-12-04: qty 100, 25d supply, fill #0

## 2023-12-04 NOTE — Telephone Encounter (Signed)
 [facility] pharmacy and confirmed patient has refills on file and available for losartan , metformin  and rosuvastatin . Pend and send request sent for hydrochlorothiazide  and insulin  pen needle.

## 2023-12-04 NOTE — Telephone Encounter (Signed)
 Copied from CRM [id number]. Topic: Clinical - Medication Refill >> Dec 04, 2023  9:39 AM Blair Bumpers wrote: Medication: hydrochlorothiazide  (MICROZIDE ) 12.5 MG capsule, Insulin  Pen Needle 31G X 8 MM MISC, losartan  (COZAAR ) 50 MG tablet, metFORMIN  (GLUCOPHAGE ) 1000 MG tablet, & rosuvastatin  (CRESTOR ) 10 MG tablet  Has the patient contacted their pharmacy? No (Agent: If no, request that the patient contact the pharmacy for the refill. If patient does not wish to contact the pharmacy document the reason why and proceed with request.) (Agent: If yes, when and what did the pharmacy advise?)  This is the patient's preferred pharmacy:   [facility] [address] [place] [address] Phone: [phone] Fax: [phone]    Is this the correct pharmacy for this prescription? Yes If no, delete pharmacy and type the correct one.   Has the prescription been filled recently? Yes  Is the patient out of the medication? Yes  Has the patient been seen for an appointment in the last year OR does the patient have an upcoming appointment? Yes  Can we respond through MyChart? Yes  Agent: Please be advised that Rx refills may take up to 3 business days. We ask that you follow-up with your pharmacy.

## 2023-12-05 ENCOUNTER — Other Ambulatory Visit: Payer: Self-pay

## 2023-12-11 ENCOUNTER — Other Ambulatory Visit: Payer: Self-pay

## 2023-12-11 ENCOUNTER — Encounter: Payer: Self-pay | Admitting: Student

## 2023-12-11 ENCOUNTER — Ambulatory Visit (INDEPENDENT_AMBULATORY_CARE_PROVIDER_SITE_OTHER): Payer: Self-pay | Admitting: Student

## 2023-12-11 VITALS — BP 138/87 | HR 76 | Temp 98.2°F | Ht 70.0 in | Wt 311.8 lb

## 2023-12-11 DIAGNOSIS — E1159 Type 2 diabetes mellitus with other circulatory complications: Secondary | ICD-10-CM

## 2023-12-11 DIAGNOSIS — Z7984 Long term (current) use of oral hypoglycemic drugs: Secondary | ICD-10-CM

## 2023-12-11 DIAGNOSIS — I152 Hypertension secondary to endocrine disorders: Secondary | ICD-10-CM

## 2023-12-11 DIAGNOSIS — Z794 Long term (current) use of insulin: Secondary | ICD-10-CM

## 2023-12-11 DIAGNOSIS — Z6841 Body Mass Index (BMI) 40.0 and over, adult: Secondary | ICD-10-CM

## 2023-12-11 DIAGNOSIS — E119 Type 2 diabetes mellitus without complications: Secondary | ICD-10-CM

## 2023-12-11 MED ORDER — EMPAGLIFLOZIN 10 MG PO TABS
10.0000 mg | ORAL_TABLET | Freq: Every day | ORAL | 11 refills | Status: DC
  Filled 2023-12-11: qty 30, 30d supply, fill #0

## 2023-12-11 MED ORDER — MOUNJARO 2.5 MG/0.5ML ~~LOC~~ SOAJ
2.5000 mg | SUBCUTANEOUS | 1 refills | Status: DC
  Filled 2023-12-11: qty 2, 28d supply, fill #0

## 2023-12-11 MED ORDER — HYDROCHLOROTHIAZIDE 12.5 MG PO CAPS
12.5000 mg | ORAL_CAPSULE | Freq: Every day | ORAL | 3 refills | Status: DC
  Filled 2023-12-11: qty 90, 90d supply, fill #0

## 2023-12-11 NOTE — Assessment & Plan Note (Addendum)
 OP regimen is basaglar  24 units daily, novolog  3 units TID with meals, metformin  1000 mg BID.  Status: controlled. T2DM with last A1c of 5.9% in 11/2023.  Currently taking lantus  24 units daily, novolog  3 units TID AC, metformin  1000 mg BID.  Patient denies hypoglycemia, lowest in 90s.  Did not bring meter but reports checking daily. Reports avg fasting blood glucose 118-123.   Of note, patient reports insurance (BCBS) to start on 6/15. Currently uninsured. Discussed plan to start SGLT-2 and GLP-1 once insurance coverage.   Plan -Continue Lantus , Novolog  and metformin  for now (may wean off insulin  regimen if starting SGLT2 & GLP-1) -New insurance kicks in 6/15, future Rx sent for Jardiance  10 mg daily and Mounjaro 2.5 mg weekly sent to start after 6/15 -A1c in 2 months -Ophthalmology referral: after insurance starts -Urine ACR pending  -LDL 85, on rosuvastatin  10 mg, unable to collect lipid panel at this OV repeat at next OV

## 2023-12-11 NOTE — Assessment & Plan Note (Signed)
 BMI 44.74. Weight has decreased since last year. Continuing to work on lifestyle modifications such as healthy eating habits and physical activity. Plan for GLP-1 for diabetes control once he has insurance coverage.

## 2023-12-11 NOTE — Patient Instructions (Addendum)
 Thank you, [name] for allowing us  to provide your care today. Today we discussed   -Blood pressure has improved 138/87. Continue with losartan  and hydrochlorothiazide . Blood work today to check electrolytes and kidney function.  -Diabetes much improved!  -Continue checking your blood sugar every day.  -I will send over prescription for Jardiance  and Mounjaro to your pharmacy after your insurance kicks in after 6/15.   --Jardiance  is one tablet a day.  --Mounjaro is weekly injection. Start at low dose for 4 weeks and if doing well can increase to higher dose each month.   I expect once you start those two medications, you will not need as much insulin . Likely can stop your meal time insulin . If you have low sugar < 70 call the office.     Follow up: 2-3 months   Should you have any questions or concerns please call the internal medicine clinic at [phone].    Claris Guymon, D.O. [facility] Internal Medicine Center

## 2023-12-11 NOTE — Progress Notes (Signed)
 CC: T2DM f/u  HPI:  Mark Schneider is a 43 y.o. male living with a history stated below and presents today for T2DM f/u. Please see problem based assessment and plan for additional details.  Past Medical History:  Diagnosis Date   Acute renal failure (ARF) (HCC) 11/15/2017   DM (diabetes mellitus) (HCC)    Hypertension     Current Outpatient Medications on File Prior to Visit  Medication Sig Dispense Refill   Blood Glucose Monitoring Suppl DEVI 1 each by Does not apply route 3 (three) times daily. May dispense any manufacturer covered by patient's insurance. 1 each 0   Glucose Blood (BLOOD GLUCOSE TEST STRIPS) STRP 1 each by Does not apply route 3 (three) times daily. Use as directed to check blood sugar. May dispense any manufacturer covered by patient's insurance and fits patient's device. 100 strip 0   hydrochlorothiazide  (MICROZIDE ) 12.5 MG capsule Take 1 capsule (12.5 mg total) by mouth daily. 30 capsule 0   insulin  aspart (NOVOLOG ) 100 UNIT/ML FlexPen Inject 3 Units into the skin 3 (three) times daily with meals. Check glucose before breakfast, lunch, and dinner 15 mL 11   insulin  glargine (LANTUS  SOLOSTAR) 100 UNIT/ML Solostar Pen Inject 24 Units into the skin daily. 15 mL 11   Insulin  Pen Needle 31G X 8 MM MISC Use to inject insulin  as directed 100 each 0   Lancet Device MISC 1 each by Does not apply route 3 (three) times daily. May dispense any manufacturer covered by patient's insurance. 1 each 0   Lancets MISC 1 each by Does not apply route 3 (three) times daily. Use as directed to check blood sugar. May dispense any manufacturer covered by patient's insurance and fits patient's device. 100 each 0   losartan  (COZAAR ) 50 MG tablet Take 1 tablet (50 mg total) by mouth daily. 30 tablet 11   metFORMIN  (GLUCOPHAGE ) 1000 MG tablet Take 1 tablet (1,000 mg total) by mouth 2 (two) times daily with a meal. 60 tablet 11   rosuvastatin  (CRESTOR ) 10 MG tablet Take 1 tablet (10 mg  total) by mouth daily. 30 tablet 11   No current facility-administered medications on file prior to visit.    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 [employer]  Tobacco Use   Smoking status: Never   Smokeless tobacco: Never  Vaping Use   Vaping status: Never Used  Substance and Sexual Activity   Alcohol use: No   Drug use: Yes    Types: Marijuana   Sexual activity: Not Currently  Other Topics Concern   Not on file  Social History Narrative   Not on file   Social Drivers of Health   Financial Resource Strain: Low Risk  (12/11/2023)   Overall Financial Resource Strain (CARDIA)    Difficulty of Paying Living Expenses: Not hard at all  Food Insecurity: No Food Insecurity (08/08/2023)   Hunger Vital Sign    Worried About Running Out of Food in the Last Year: Never true  Ran Out of Food in the Last Year: Never true  Transportation Needs: No Transportation Needs (08/08/2023)   PRAPARE - Administrator, Civil Service (Medical): No    Lack of Transportation (Non-Medical): No  Physical Activity: Insufficiently Active (12/11/2023)   Exercise Vital Sign    Days of Exercise per Week: 2 days    Minutes of Exercise per Session: 60 min  Stress: No Stress Concern Present (12/11/2023)   [employer] of Occupational Health - Occupational Stress Questionnaire    Feeling of Stress : Not at all  Social Connections: Moderately Isolated (12/11/2023)   Social Connection and Isolation Panel [NHANES]    Frequency of Communication with Friends and Family: More than three times a week     Frequency of Social Gatherings with Friends and Family: More than three times a week    Attends Religious Services: More than 4 times per year    Active Member of [employer] or Organizations: No    Attends Banker Meetings: Never    Marital Status: Never married  Intimate Partner Violence: Not At Risk (08/08/2023)   Humiliation, Afraid, Rape, and Kick questionnaire    Fear of Current or Ex-Partner: No    Emotionally Abused: No    Physically Abused: No    Sexually Abused: No    Review of Systems: ROS negative except for what is noted on the assessment and plan.  Vitals:   12/11/23 1607 12/11/23 1637  BP: (!) 142/88 138/87  Pulse: 82 76  Temp: 98.2 F (36.8 C)   TempSrc: Oral   SpO2: 97%   Weight: (!) 311 lb 12.8 oz (141.4 kg)   Height: 5\' 10"  (1.778 m)    Physical Exam: Constitutional: well-appearing male sitting in chair, in no acute distress Cardiovascular: regular rate and rhythm Pulmonary/Chest: normal work of breathing on room air, lungs clear to auscultation bilaterally Neurological: alert & oriented x 3 Psych: pleasant mood  Assessment & Plan:   Hypertension associated with diabetes (HCC) Status: improved. BP on repeat better 138/87. Taking losartan  50 mg and recently added a month ago hydrochlorothiazide  12.5 mg daily. Reports adherence and tolerating well. No acute concerns. Last BMP 11/2023 with normal electrolytes and renal function.   Plan -Continue losartan  and hydrochlorothiazide , f/u at next visit if need for further titration or consolidation  -Repeat BMP today   Diabetes mellitus (HCC) OP regimen is basaglar  24 units daily, novolog  3 units TID with meals, metformin  1000 mg BID.  Status: controlled. T2DM with last A1c of 5.9% in 11/2023.  Currently taking lantus  24 units daily, novolog  3 units TID AC, metformin  1000 mg BID.  Patient denies hypoglycemia, lowest in 90s.  Did not bring meter but reports checking daily. Reports avg fasting blood glucose  118-123.   Of note, patient reports insurance (BCBS) to start on 6/15. Currently uninsured. Discussed plan to start SGLT-2 and GLP-1 once insurance coverage.   Plan -Continue Lantus , Novolog  and metformin  for now (may wean off insulin  regimen if starting SGLT2 & GLP-1) -New insurance kicks in 6/15, future Rx sent for Jardiance  10 mg daily and Mounjaro 2.5 mg weekly sent to start after 6/15 -A1c in 2 months -Ophthalmology referral: after insurance starts -Urine ACR pending  -LDL 85, on rosuvastatin  10 mg, unable to collect lipid panel at this OV repeat at next OV   Body mass index (BMI) 40.0-44.9, adult (HCC) BMI 44.74. Weight has decreased since last year. Continuing to work on lifestyle modifications such as  healthy eating habits and physical activity. Plan for GLP-1 for diabetes control once he has insurance coverage.    Patient discussed with Dr. Machen  Read Bonelli, D.O. [facility] Internal Medicine, PGY-2 Phone: [phone] Date 12/11/2023 Time 7:15 PM

## 2023-12-11 NOTE — Assessment & Plan Note (Signed)
 Status: improved. BP on repeat better 138/87. Taking losartan  50 mg and recently added a month ago hydrochlorothiazide  12.5 mg daily. Reports adherence and tolerating well. No acute concerns. Last BMP 11/2023 with normal electrolytes and renal function.   Plan -Continue losartan  and hydrochlorothiazide , f/u at next visit if need for further titration or consolidation  -Repeat BMP today

## 2023-12-12 ENCOUNTER — Other Ambulatory Visit: Payer: Self-pay

## 2023-12-12 ENCOUNTER — Telehealth: Payer: Self-pay

## 2023-12-12 NOTE — Progress Notes (Signed)
 Internal Medicine Clinic Attending  Case discussed with the resident at the time of the visit.  We reviewed the resident's history and exam and pertinent patient test results.  I agree with the assessment, diagnosis, and plan of care documented in the resident's note.

## 2023-12-12 NOTE — Telephone Encounter (Signed)
 Patient was identified as falling into the [place] Measure - Diabetes.   Patient was: Appointment scheduled with primary care provider in the next 30 days.  7/14

## 2023-12-13 ENCOUNTER — Encounter: Payer: Self-pay | Admitting: Student

## 2023-12-13 DIAGNOSIS — N183 Chronic kidney disease, stage 3 unspecified: Secondary | ICD-10-CM | POA: Insufficient documentation

## 2023-12-13 LAB — BMP8+ANION GAP
Anion Gap: 15 mmol/L (ref 10.0–18.0)
BUN/Creatinine Ratio: 11 (ref 9–20)
BUN: 18 mg/dL (ref 6–24)
CO2: 21 mmol/L (ref 20–29)
Calcium: 9.4 mg/dL (ref 8.7–10.2)
Chloride: 106 mmol/L (ref 96–106)
Creatinine, Ser: 1.63 mg/dL — ABNORMAL HIGH (ref 0.76–1.27)
Glucose: 95 mg/dL (ref 70–99)
Potassium: 4.4 mmol/L (ref 3.5–5.2)
Sodium: 142 mmol/L (ref 134–144)
eGFR: 54 mL/min/{1.73_m2} — ABNORMAL LOW (ref >59)

## 2023-12-13 LAB — MICROALBUMIN / CREATININE URINE RATIO
Creatinine, Urine: 125.9 mg/dL
Microalb/Creat Ratio: 16 mg/g{creat} (ref 0–29)
Microalbumin, Urine: 20.1 ug/mL

## 2023-12-14 ENCOUNTER — Ambulatory Visit: Payer: Self-pay | Admitting: Student

## 2024-01-14 ENCOUNTER — Encounter: Payer: Self-pay | Admitting: Student

## 2024-01-21 ENCOUNTER — Other Ambulatory Visit: Payer: Self-pay | Admitting: Student

## 2024-01-21 DIAGNOSIS — E785 Hyperlipidemia, unspecified: Secondary | ICD-10-CM

## 2024-01-21 DIAGNOSIS — E119 Type 2 diabetes mellitus without complications: Secondary | ICD-10-CM

## 2024-01-21 DIAGNOSIS — E1159 Type 2 diabetes mellitus with other circulatory complications: Secondary | ICD-10-CM

## 2024-01-21 NOTE — Telephone Encounter (Unsigned)
 Copied from CRM [id number]. Topic: Clinical - Medication Refill >> Jan 21, 2024  1:47 PM Laurier C wrote: Medication:  metFORMIN  (GLUCOPHAGE ) 1000 MG tablet losartan  (COZAAR ) 50 MG tablet rosuvastatin  (CRESTOR ) 10 MG tablet   Has the patient contacted their pharmacy? No (Agent: If no, request that the patient contact the pharmacy for the refill. If patient does not wish to contact the pharmacy document the reason why and proceed with request.) (Agent: If yes, when and what did the pharmacy advise?)  This is the patient's preferred pharmacy:  [place] [facility] [address], [address] [place] [address] Phone: [phone] Fax: [phone]  Is this the correct pharmacy for this prescription? Yes If no, delete pharmacy and type the correct one.   Has the prescription been filled recently? No  Is the patient out of the medication? No  Has the patient been seen for an appointment in the last year OR does the patient have an upcoming appointment? Yes  Can we respond through MyChart? Yes  Agent: Please be advised that Rx refills may take up to 3 business days. We ask that you follow-up with your pharmacy.

## 2024-01-23 ENCOUNTER — Other Ambulatory Visit (HOSPITAL_COMMUNITY): Payer: Self-pay

## 2024-01-23 MED ORDER — LOSARTAN POTASSIUM 50 MG PO TABS
50.0000 mg | ORAL_TABLET | Freq: Every day | ORAL | 11 refills | Status: DC
  Filled 2024-01-23 (×2): qty 30, 30d supply, fill #0

## 2024-01-23 MED ORDER — ROSUVASTATIN CALCIUM 10 MG PO TABS
10.0000 mg | ORAL_TABLET | Freq: Every day | ORAL | 11 refills | Status: DC
  Filled 2024-01-23: qty 30, 30d supply, fill #0

## 2024-01-23 MED ORDER — METFORMIN HCL 1000 MG PO TABS
1000.0000 mg | ORAL_TABLET | Freq: Two times a day (BID) | ORAL | 11 refills | Status: DC
  Filled 2024-01-23: qty 60, 30d supply, fill #0

## 2024-01-25 ENCOUNTER — Other Ambulatory Visit: Payer: Self-pay

## 2024-02-01 ENCOUNTER — Encounter: Payer: Self-pay | Admitting: Student

## 2024-02-04 ENCOUNTER — Encounter: Payer: Self-pay | Admitting: Student

## 2024-02-26 ENCOUNTER — Ambulatory Visit: Payer: Self-pay | Admitting: Student

## 2024-02-26 ENCOUNTER — Other Ambulatory Visit: Payer: Self-pay

## 2024-02-26 ENCOUNTER — Encounter: Payer: Self-pay | Admitting: Student

## 2024-02-26 VITALS — BP 137/83 | HR 77 | Temp 98.7°F | Ht 70.0 in | Wt 315.2 lb

## 2024-02-26 DIAGNOSIS — Z7984 Long term (current) use of oral hypoglycemic drugs: Secondary | ICD-10-CM

## 2024-02-26 DIAGNOSIS — Z794 Long term (current) use of insulin: Secondary | ICD-10-CM

## 2024-02-26 DIAGNOSIS — E119 Type 2 diabetes mellitus without complications: Secondary | ICD-10-CM

## 2024-02-26 DIAGNOSIS — R0683 Snoring: Secondary | ICD-10-CM

## 2024-02-26 DIAGNOSIS — E1159 Type 2 diabetes mellitus with other circulatory complications: Secondary | ICD-10-CM

## 2024-02-26 DIAGNOSIS — I152 Hypertension secondary to endocrine disorders: Secondary | ICD-10-CM

## 2024-02-26 DIAGNOSIS — Z1159 Encounter for screening for other viral diseases: Secondary | ICD-10-CM

## 2024-02-26 DIAGNOSIS — E785 Hyperlipidemia, unspecified: Secondary | ICD-10-CM

## 2024-02-26 DIAGNOSIS — Z6841 Body Mass Index (BMI) 40.0 and over, adult: Secondary | ICD-10-CM

## 2024-02-26 DIAGNOSIS — L600 Ingrowing nail: Secondary | ICD-10-CM

## 2024-02-26 LAB — POCT GLYCOSYLATED HEMOGLOBIN (HGB A1C): HbA1c, POC (controlled diabetic range): 5.9 % (ref 0.0–7.0)

## 2024-02-26 LAB — GLUCOSE, CAPILLARY: Glucose-Capillary: 111 mg/dL — ABNORMAL HIGH (ref 70–99)

## 2024-02-26 MED ORDER — METFORMIN HCL 1000 MG PO TABS: 1000.0000 mg | ORAL_TABLET | Freq: Two times a day (BID) | ORAL | 11 refills | Status: DC

## 2024-02-26 MED ORDER — LANTUS SOLOSTAR 100 UNIT/ML ~~LOC~~ SOPN
20.0000 [IU] | PEN_INJECTOR | Freq: Every day | SUBCUTANEOUS | 11 refills | Status: DC
Start: 2024-02-26 — End: 2024-02-28
  Filled 2024-02-26 (×2): qty 15, 75d supply, fill #0

## 2024-02-26 MED ORDER — METFORMIN HCL 1000 MG PO TABS
1000.0000 mg | ORAL_TABLET | Freq: Two times a day (BID) | ORAL | 11 refills | Status: DC
  Filled 2024-02-26: qty 60, 30d supply, fill #0

## 2024-02-26 MED ORDER — ROSUVASTATIN CALCIUM 10 MG PO TABS
10.0000 mg | ORAL_TABLET | Freq: Every day | ORAL | 11 refills | Status: DC
  Filled 2024-02-26: qty 30, 30d supply, fill #0

## 2024-02-26 MED ORDER — LANTUS SOLOSTAR 100 UNIT/ML ~~LOC~~ SOPN: 20.0000 [IU] | PEN_INJECTOR | Freq: Every day | SUBCUTANEOUS | 11 refills | Status: DC

## 2024-02-26 MED ORDER — LOSARTAN POTASSIUM 100 MG PO TABS
100.0000 mg | ORAL_TABLET | Freq: Every day | ORAL | 11 refills | Status: DC
  Filled 2024-02-26: qty 30, 30d supply, fill #0

## 2024-02-26 MED ORDER — LOSARTAN POTASSIUM 100 MG PO TABS: 100.0000 mg | ORAL_TABLET | Freq: Every day | ORAL | 11 refills | Status: DC

## 2024-02-26 MED ORDER — HYDROCHLOROTHIAZIDE 12.5 MG PO CAPS
12.5000 mg | ORAL_CAPSULE | Freq: Every day | ORAL | 3 refills | Status: DC
  Filled 2024-02-26: qty 90, 90d supply, fill #0

## 2024-02-26 MED ORDER — ROSUVASTATIN CALCIUM 10 MG PO TABS: 10.0000 mg | ORAL_TABLET | Freq: Every day | ORAL | 11 refills | Status: DC

## 2024-02-26 MED ORDER — HYDROCHLOROTHIAZIDE 12.5 MG PO CAPS: 12.5000 mg | ORAL_CAPSULE | Freq: Every day | ORAL | 3 refills | Status: DC

## 2024-02-26 NOTE — Assessment & Plan Note (Signed)
 Remarkable diabetic control after being discovered with a hemoglobin A1c above 15 earlier this year.  Now his A1c is sustained below 6.  Currently takes Lantus  and metformin .  As he is without insurance, alternative therapies are not currently on board.  He does not have hyper or hypoglycemia, sugars are consistently between 100 and 180 on a.m. and daily checks.  -His obesity and kidney disease, ultimately a GLP-1 and SGLT2 medicine would be very appropriate for him.  However he is without insurance and so we will not do that at this time.  He briefly had insurance earlier this summer but it was too costly.  - For now, continue metformin  1000 twice a day - Reduce insulin .  Stop NovoLog  3 with meals.  Reduce Lantus  to 20 units daily

## 2024-02-26 NOTE — Progress Notes (Signed)
   CC: Checkup diabetes and hypertension  HPI:  [name] is a 43 y.o. male with a PMH stated below who presents today for checkup of chronic medical issues.  Please see problem based assessment and plan for additional details.  Past Medical History:  Diagnosis Date   Acute renal failure (ARF) (HCC) 11/15/2017   DM (diabetes mellitus) (HCC)    Hypertension    Review of Systems: ROS negative except for what is noted on the assessment and plan.  Vitals:   02/26/24 1513 02/26/24 1542  BP: (!) 142/79 137/83  Pulse: 87 77  Temp: 98.7 F (37.1 C)   TempSrc: Oral   SpO2: 96%   Weight: (!) 315 lb 3.2 oz (143 kg)   Height: 5' 10 (1.778 m)    Physical Exam: Constitutional: well-appearing man in no acute distress Cardiovascular: regular rate and rhythm, no m/r/g Pulmonary/Chest: normal work of breathing on room air, lungs clear to auscultation bilaterally Abdominal: soft, non-tender, non-distended MSK: normal bulk and tone Skin: warm and dry. R great toe ingrown nail no lesion, drainage, bleeding. Psych: normal mood and behavior  Assessment & Plan:   Patient discussed with Dr. Karna  Diabetes mellitus (HCC) Remarkable diabetic control after being discovered with a hemoglobin A1c above 15 earlier this year.  Now his A1c is sustained below 6.  Currently takes Lantus  and metformin .  As he is without insurance, alternative therapies are not currently on board.  He does not have hyper or hypoglycemia, sugars are consistently between 100 and 180 on a.m. and daily checks.  -His obesity and kidney disease, ultimately a GLP-1 and SGLT2 medicine would be very appropriate for him.  However he is without insurance and so we will not do that at this time.  He briefly had insurance earlier this summer but it was too costly.  - For now, continue metformin  1000 twice a day - Reduce insulin .  Stop NovoLog  3 with meals.  Reduce Lantus  to 20 units daily  Hypertension associated with  diabetes (HCC) Blood pressure is heading in the right direction but remains above goal.  Goal would be 130/80 for him.  Systolic numbers are consistently in the upper 130s to 140s on recent office visits. - Increase losartan  to 100 daily and continue hydrochlorothiazide  12.5 daily  Ingrown toenail Noted on his right great toe.  It is without purulence, bleeding, open lesion, but has bothered him for over 1 month.  He is at about diabetic and so I will be careful with his foot. - Will refer to podiatry  Body mass index (BMI) 40.0-44.9, adult (HCC) Amend calorie counting with my fitness pal to assess his nutritional intake.  Hope for a GLP-1 in the future.  His goal is 270 pounds next spring.  Current weight 315 pounds  Snoring STOP-BANG 6-7, concern for sleep apnea, comorbid obesity, referral to sleep study placed.  RTC in 4 weeks for follow up med changes of insulin  and increased losartan .  Lonni Africa, D.O. [facility] Internal Medicine, PGY-2 Phone: [phone] Date 02/26/2024 Time 5:04 PM

## 2024-02-26 NOTE — Assessment & Plan Note (Signed)
 Noted on his right great toe.  It is without purulence, bleeding, open lesion, but has bothered him for over 1 month.  He is at about diabetic and so I will be careful with his foot. - Will refer to podiatry

## 2024-02-26 NOTE — Patient Instructions (Addendum)
 Increase losartan  to 100 MG daily Stop mealtime insulin  Novolog  Reduce Insulin  Lantus  to 20 units daily Please expect a call to see a podiatrist Please return in one month

## 2024-02-26 NOTE — Assessment & Plan Note (Signed)
 Amend calorie counting with my fitness pal to assess his nutritional intake.  Hope for a GLP-1 in the future.  His goal is 270 pounds next spring.  Current weight 315 pounds

## 2024-02-26 NOTE — Assessment & Plan Note (Signed)
 Blood pressure is heading in the right direction but remains above goal.  Goal would be 130/80 for him.  Systolic numbers are consistently in the upper 130s to 140s on recent office visits. - Increase losartan  to 100 daily and continue hydrochlorothiazide  12.5 daily

## 2024-02-26 NOTE — Assessment & Plan Note (Addendum)
 STOP-BANG 6-7, concern for sleep apnea, comorbid obesity, referral to sleep study placed.

## 2024-02-27 ENCOUNTER — Other Ambulatory Visit: Payer: Self-pay | Admitting: Student

## 2024-02-27 DIAGNOSIS — Z794 Long term (current) use of insulin: Secondary | ICD-10-CM

## 2024-02-27 LAB — HCV AB W REFLEX TO QUANT PCR: HCV Ab: NONREACTIVE

## 2024-02-27 LAB — LIPID PANEL
Chol/HDL Ratio: 2.5 ratio (ref 0.0–5.0)
Cholesterol, Total: 127 mg/dL (ref 100–199)
HDL: 50 mg/dL (ref >39)
LDL Chol Calc (NIH): 60 mg/dL (ref 0–99)
Triglycerides: 87 mg/dL (ref 0–149)
VLDL Cholesterol Cal: 17 mg/dL (ref 5–40)

## 2024-02-27 LAB — HCV INTERPRETATION

## 2024-02-28 ENCOUNTER — Other Ambulatory Visit: Payer: Self-pay

## 2024-02-28 ENCOUNTER — Ambulatory Visit: Payer: Self-pay | Admitting: Student

## 2024-02-29 NOTE — Progress Notes (Signed)
 Internal Medicine Clinic Attending  Case discussed with the resident at the time of the visit.  We reviewed the resident's history and exam and pertinent patient test results.  I agree with the assessment, diagnosis, and plan of care documented in the resident's note.

## 2024-03-05 ENCOUNTER — Telehealth: Payer: Self-pay | Admitting: *Deleted

## 2024-03-05 ENCOUNTER — Other Ambulatory Visit: Payer: Self-pay

## 2024-03-05 DIAGNOSIS — E1159 Type 2 diabetes mellitus with other circulatory complications: Secondary | ICD-10-CM

## 2024-03-05 DIAGNOSIS — E785 Hyperlipidemia, unspecified: Secondary | ICD-10-CM

## 2024-03-05 DIAGNOSIS — E119 Type 2 diabetes mellitus without complications: Secondary | ICD-10-CM

## 2024-03-05 MED ORDER — INSULIN DEGLUDEC FLEXTOUCH 200 UNIT/ML ~~LOC~~ SOPN
20.0000 [IU] | PEN_INJECTOR | Freq: Every day | SUBCUTANEOUS | 3 refills | Status: AC
  Filled 2024-03-05: qty 15, fill #0

## 2024-03-05 MED ORDER — HYDROCHLOROTHIAZIDE 12.5 MG PO CAPS
12.5000 mg | ORAL_CAPSULE | Freq: Every day | ORAL | 3 refills | Status: DC
  Filled 2024-03-05: qty 90, 90d supply, fill #0

## 2024-03-05 MED ORDER — METFORMIN HCL 1000 MG PO TABS
1000.0000 mg | ORAL_TABLET | Freq: Two times a day (BID) | ORAL | 11 refills | Status: DC
  Filled 2024-03-05: qty 60, 30d supply, fill #0

## 2024-03-05 MED ORDER — LOSARTAN POTASSIUM 100 MG PO TABS
100.0000 mg | ORAL_TABLET | Freq: Every day | ORAL | 11 refills | Status: DC
  Filled 2024-03-05: qty 30, 30d supply, fill #0

## 2024-03-05 MED ORDER — ROSUVASTATIN CALCIUM 10 MG PO TABS
10.0000 mg | ORAL_TABLET | Freq: Every day | ORAL | 11 refills | Status: DC
  Filled 2024-03-05: qty 30, 30d supply, fill #0

## 2024-03-05 NOTE — Telephone Encounter (Signed)
 Call to [facility] on [address].  Patient has prescriptions there for his medications.  To early for refills now.  Lantus  has been discontinued.  Call to patient informed him that his prescriptions have been sent to the [facility] on [address] as requested.                    Copied from CRM #[id number]. Topic: Clinical - Prescription Issue >> Mar 05, 2024  9:30 AM Alfonso ORN wrote: Reason for CRM: pt. medication refills sent  to wrong pharmacy which charging a higher price  this is the wrong pharmacy the medication was sent to  [id number] - [place], [place] - [address] AT CORNER OF [facility] [address] [facility] [address] [place] [address] Phone: [phone] Fax: [phone]   the correct pharmacy is [facility] [address], [address], [place] [address] Phone: [phone]  Fax: [phone]   Medications that were sent for refills :  losartan  (COZAAR ) 100 MG tablet metFORMIN  (GLUCOPHAGE ) 1000 MG tablet rosuvastatin  (CRESTOR ) 10 MG tablet hydrochlorothiazide  (MICROZIDE ) 12.5 MG capsule lantus  solontar

## 2024-03-06 ENCOUNTER — Other Ambulatory Visit: Payer: Self-pay

## 2024-03-10 ENCOUNTER — Other Ambulatory Visit: Payer: Self-pay

## 2024-04-14 ENCOUNTER — Ambulatory Visit: Payer: Self-pay

## 2024-06-09 ENCOUNTER — Telehealth: Payer: Self-pay | Admitting: Student

## 2024-06-09 ENCOUNTER — Ambulatory Visit: Payer: Self-pay

## 2024-06-09 DIAGNOSIS — E119 Type 2 diabetes mellitus without complications: Secondary | ICD-10-CM

## 2024-06-09 DIAGNOSIS — E785 Hyperlipidemia, unspecified: Secondary | ICD-10-CM

## 2024-06-09 DIAGNOSIS — E1159 Type 2 diabetes mellitus with other circulatory complications: Secondary | ICD-10-CM

## 2024-06-09 NOTE — Telephone Encounter (Unsigned)
 Copied from CRM [id number]. Topic: Clinical - Medication Refill >> Jun 09, 2024 12:49 PM Susanna ORN wrote: Medication: hydrochlorothiazide  (MICROZIDE ) 12.5 MG capsule, losartan  (COZAAR ) 100 MG tablet, metFORMIN  (GLUCOPHAGE ) 1000 MG tablet, & rosuvastatin  (CRESTOR ) 10 MG tablet  Has the patient contacted their pharmacy? No (Agent: If no, request that the patient contact the pharmacy for the refill. If patient does not wish to contact the pharmacy document the reason why and proceed with request.) (Agent: If yes, when and what did the pharmacy advise?)  This is the patient's preferred pharmacy:  [facility] [address], [address] [place] [address] Phone: [phone] Fax: [phone]    Is this the correct pharmacy for this prescription? Yes If no, delete pharmacy and type the correct one.   Has the prescription been filled recently? Yes  Is the patient out of the medication? Yes  Has the patient been seen for an appointment in the last year OR does the patient have an upcoming appointment? Yes  Can we respond through MyChart? Yes  Agent: Please be advised that Rx refills may take up to 3 business days. We ask that you follow-up with your pharmacy.

## 2024-06-10 MED ORDER — HYDROCHLOROTHIAZIDE 12.5 MG PO CAPS: 12.5000 mg | ORAL_CAPSULE | Freq: Every day | ORAL | 0 refills | Status: DC

## 2024-06-10 MED ORDER — METFORMIN HCL 1000 MG PO TABS: 1000.0000 mg | ORAL_TABLET | Freq: Two times a day (BID) | ORAL | 0 refills | Status: DC

## 2024-06-10 MED ORDER — LOSARTAN POTASSIUM 100 MG PO TABS: 100.0000 mg | ORAL_TABLET | Freq: Every day | ORAL | 0 refills | Status: DC

## 2024-06-10 MED ORDER — ROSUVASTATIN CALCIUM 10 MG PO TABS: 10.0000 mg | ORAL_TABLET | Freq: Every day | ORAL | 0 refills | Status: DC

## 2024-06-24 ENCOUNTER — Other Ambulatory Visit: Payer: Self-pay

## 2024-06-24 ENCOUNTER — Ambulatory Visit: Payer: Self-pay | Admitting: Student

## 2024-06-24 ENCOUNTER — Telehealth: Payer: Self-pay

## 2024-06-24 VITALS — BP 142/81 | HR 81 | Temp 98.4°F | Ht 70.0 in | Wt 313.8 lb

## 2024-06-24 DIAGNOSIS — Z7985 Long-term (current) use of injectable non-insulin antidiabetic drugs: Secondary | ICD-10-CM

## 2024-06-24 DIAGNOSIS — Z79899 Other long term (current) drug therapy: Secondary | ICD-10-CM | POA: Diagnosis not present

## 2024-06-24 DIAGNOSIS — I152 Hypertension secondary to endocrine disorders: Secondary | ICD-10-CM

## 2024-06-24 DIAGNOSIS — Z833 Family history of diabetes mellitus: Secondary | ICD-10-CM | POA: Diagnosis not present

## 2024-06-24 DIAGNOSIS — L600 Ingrowing nail: Secondary | ICD-10-CM

## 2024-06-24 DIAGNOSIS — Z7984 Long term (current) use of oral hypoglycemic drugs: Secondary | ICD-10-CM | POA: Diagnosis not present

## 2024-06-24 DIAGNOSIS — R0683 Snoring: Secondary | ICD-10-CM | POA: Diagnosis not present

## 2024-06-24 DIAGNOSIS — E785 Hyperlipidemia, unspecified: Secondary | ICD-10-CM

## 2024-06-24 DIAGNOSIS — E1159 Type 2 diabetes mellitus with other circulatory complications: Secondary | ICD-10-CM

## 2024-06-24 DIAGNOSIS — E119 Type 2 diabetes mellitus without complications: Secondary | ICD-10-CM

## 2024-06-24 LAB — POCT GLYCOSYLATED HEMOGLOBIN (HGB A1C): HbA1c, POC (controlled diabetic range): 6.4 % (ref 0.0–7.0)

## 2024-06-24 LAB — GLUCOSE, CAPILLARY: Glucose-Capillary: 124 mg/dL — ABNORMAL HIGH (ref 70–99)

## 2024-06-24 MED ORDER — METFORMIN HCL 1000 MG PO TABS
1000.0000 mg | ORAL_TABLET | Freq: Two times a day (BID) | ORAL | 0 refills | Status: AC
  Filled 2024-06-24 – 2024-07-09 (×2): qty 180, 90d supply, fill #0

## 2024-06-24 MED ORDER — MOUNJARO 10 MG/0.5ML ~~LOC~~ SOAJ
10.0000 mg | SUBCUTANEOUS | 0 refills | Status: AC
  Filled 2024-06-24: qty 2, 28d supply, fill #0

## 2024-06-24 MED ORDER — MOUNJARO 5 MG/0.5ML ~~LOC~~ SOAJ
5.0000 mg | SUBCUTANEOUS | 0 refills | Status: AC
  Filled 2024-06-24: qty 2, 28d supply, fill #0

## 2024-06-24 MED ORDER — MOUNJARO 12.5 MG/0.5ML ~~LOC~~ SOAJ
12.5000 mg | SUBCUTANEOUS | 0 refills | Status: AC
  Filled 2024-06-24: qty 2, 28d supply, fill #0

## 2024-06-24 MED ORDER — OLMESARTAN MEDOXOMIL-HCTZ 40-25 MG PO TABS
1.0000 | ORAL_TABLET | Freq: Every day | ORAL | 2 refills | Status: AC
  Filled 2024-06-24 – 2024-07-09 (×2): qty 30, 30d supply, fill #0

## 2024-06-24 MED ORDER — ROSUVASTATIN CALCIUM 10 MG PO TABS
10.0000 mg | ORAL_TABLET | Freq: Every day | ORAL | 0 refills | Status: AC
  Filled 2024-06-24 – 2024-07-09 (×2): qty 90, 90d supply, fill #0

## 2024-06-24 MED ORDER — MOUNJARO 7.5 MG/0.5ML ~~LOC~~ SOAJ
7.5000 mg | SUBCUTANEOUS | 0 refills | Status: AC
  Filled 2024-06-24: qty 2, 28d supply, fill #0

## 2024-06-24 MED ORDER — MOUNJARO 15 MG/0.5ML ~~LOC~~ SOAJ
15.0000 mg | SUBCUTANEOUS | 3 refills | Status: AC
  Filled 2024-06-24: qty 2, 28d supply, fill #0

## 2024-06-24 MED ORDER — MOUNJARO 2.5 MG/0.5ML ~~LOC~~ SOAJ
2.5000 mg | SUBCUTANEOUS | 0 refills | Status: AC
  Filled 2024-06-24 – 2024-07-09 (×2): qty 2, 28d supply, fill #0

## 2024-06-24 NOTE — Assessment & Plan Note (Addendum)
 Lab Results  Component Value Date   HGBA1C 6.4 06/24/2024   HGBA1C 5.9 02/26/2024   HGBA1C 5.9 (A) 11/01/2023   Chronic with good glycemic control. Takes metformin  1000 mg twice daily and insulin  degludec 20 units daily.  For diabetes and comorbid obesity, plus probable OSA we are starting a trial of tirzepatide .  Made lots of changes today including alteration of blood pressure regimen so did not start SGLT2 but this would be appropriate for him as well.  Orders:   POCT glycosylated hemoglobin (Hb A1C)   metFORMIN  (GLUCOPHAGE ) 1000 MG tablet; Take 1 tablet (1,000 mg total) by mouth 2 (two) times daily with a meal.   tirzepatide  (MOUNJARO ) 2.5 MG/0.5ML Pen; Inject 2.5 mg into the skin once a week for 28 days.   tirzepatide  (MOUNJARO ) 5 MG/0.5ML Pen; Inject 5 mg into the skin once a week for 28 days.   tirzepatide  (MOUNJARO ) 7.5 MG/0.5ML Pen; Inject 7.5 mg into the skin once a week for 28 days.   tirzepatide  (MOUNJARO ) 10 MG/0.5ML Pen; Inject 10 mg into the skin once a week for 28 days.   tirzepatide  (MOUNJARO ) 12.5 MG/0.5ML Pen; Inject 12.5 mg into the skin once a week for 28 days.   tirzepatide  (MOUNJARO ) 15 MG/0.5ML Pen; Inject 15 mg into the skin once a week.   Referral to Nutrition and Diabetes Services

## 2024-06-24 NOTE — Assessment & Plan Note (Addendum)
 Lab Results  Component Value Date   LDLCALC 60 02/26/2024   LDLCALC 85 03/01/2020   LDLCALC 100 (H) 11/27/2019   Chronic and stable.  Refilled Crestor  10 mg daily.  Orders:   rosuvastatin  (CRESTOR ) 10 MG tablet; Take 1 tablet (10 mg total) by mouth daily.

## 2024-06-24 NOTE — Progress Notes (Signed)
 " Patient name: Mark Schneider Date of birth: 19-Sep-1980 Date of visit: 06/24/2024  Subjective  Reason for visit: Follow-up (Pt is here for a follow up visit. ) and Medication Refill (Pt needs medication refill. Also needs to change pharmacy to [facility]. )  Discussed the use of AI scribe software for clinical note transcription with the patient, who gave verbal consent to proceed.  History of Present Illness   Mark Schneider is a 43 year old male with diabetes and hypertension who presents for a checkup and medication management.  Glycemic control - Diabetes managed with metformin  and 20 units of insulin  daily - Fasting blood glucose 110-124 mg/dL, nadir 91 mg/dL after hospitalization - No hypoglycemia - A1c 6.4%, improved from 14.1% one year ago - Interested in starting Jardiance  and a medication for weight loss  Hypertension management - Takes losartan  - Ran out of hydrochlorothiazide  - Does not monitor blood pressure at home  Weight management and lifestyle - Concerned about weight gain but has lost 2 pounds since last visit - Exercises regularly - Avoids sugary beverages - Eats higher protein snacks such as turkey, cheese, and nuts - 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  Sleep-disordered breathing - Hospitalized in February 2025 for sepsis, pneumonia, and influenza (10 days total, 5 days ICU) - Witnessed breathing interruptions during sleep concerning for sleep apnea during hospitalization - No sleep study performed due to prior lack of insurance  Foot health - Ingrown toenail on left great toe - Has not seen a podiatrist       Show/hide medication list[1]   Objective  Today's Vitals   06/24/24 1040 06/24/24 1048  BP: (!) 164/102 (!) 142/81  Pulse: 78 81  Temp: 98.4 F (36.9 C)   TempSrc: Oral   SpO2: 97%   Weight: (!) 313 lb 12.8 oz (142.3 kg)   Height: 5' 10 (1.778 m)   Body mass index is 45.03 kg/m.   Physical  Exam Constitutional:      Appearance: Normal appearance.  Cardiovascular:     Rate and Rhythm: Normal rate and regular rhythm.     Pulses: Normal pulses.     Heart sounds: No murmur heard. Pulmonary:     Effort: Pulmonary effort is normal. No respiratory distress.  Skin:    General: Skin is warm and dry.  Neurological:     Mental Status: He is alert.     Cranial Nerves: No facial asymmetry.  Psychiatric:        Mood and Affect: Affect normal.        Speech: Speech normal.        Behavior: Behavior normal.      Assessment & Plan Type 2 diabetes mellitus without complication, without long-term current use of insulin  (HCC) Lab Results  Component Value Date   HGBA1C 6.4 06/24/2024   HGBA1C 5.9 02/26/2024   HGBA1C 5.9 (A) 11/01/2023   Chronic with good glycemic control. Takes metformin  1000 mg twice daily and insulin  degludec 20 units daily.  For diabetes and comorbid obesity, plus probable OSA we are starting a trial of tirzepatide .  Made lots of changes today including alteration of blood pressure regimen so did not start SGLT2 but this would be appropriate for him as well.  Orders:   POCT glycosylated hemoglobin (Hb A1C)   metFORMIN  (GLUCOPHAGE ) 1000 MG tablet; Take 1 tablet (1,000 mg total) by mouth 2 (two) times daily with a meal.   tirzepatide  (MOUNJARO ) 2.5 MG/0.5ML Pen;  Inject 2.5 mg into the skin once a week for 28 days.   tirzepatide  (MOUNJARO ) 5 MG/0.5ML Pen; Inject 5 mg into the skin once a week for 28 days.   tirzepatide  (MOUNJARO ) 7.5 MG/0.5ML Pen; Inject 7.5 mg into the skin once a week for 28 days.   tirzepatide  (MOUNJARO ) 10 MG/0.5ML Pen; Inject 10 mg into the skin once a week for 28 days.   tirzepatide  (MOUNJARO ) 12.5 MG/0.5ML Pen; Inject 12.5 mg into the skin once a week for 28 days.   tirzepatide  (MOUNJARO ) 15 MG/0.5ML Pen; Inject 15 mg into the skin once a week.   Referral to Nutrition and Diabetes Services   Snoring STOP-BANG 6-7 with witnessed apneic  episode during prior hospitalization.  Referred for sleep study.  Mounjaro  started. Orders:   PSG SLEEP STUDY  Ingrown toenail Of left great toe.  Not bothering him too much today. Orders:   Ambulatory referral to Podiatry  Hypertension associated with diabetes (HCC) BP Readings from Last 3 Encounters:  06/24/24 (!) 142/81  02/26/24 137/83  12/11/23 138/87   Chronic with poor blood pressure control.  Elevated to 160 systolic on entering the clinic today.  On lisinopril 100 mg and HCTZ 12.5 mg daily, with recent interruption of the latter because he ran out of this medication at home.  Does not check blood pressure at home.  Recommend getting validated home blood pressure cuff for home measurements.  Discontinue lisinopril and HCTZ in favor of olmesartan -HCTZ 40-25 mg daily.  BMP today and at follow-up.  Orders:   olmesartan -hydrochlorothiazide  (BENICAR  HCT) 40-25 MG tablet; Take 1 tablet by mouth daily.   Basic metabolic panel with GFR   Basic metabolic panel with GFR; Future   Hyperlipidemia, unspecified hyperlipidemia type Lab Results  Component Value Date   LDLCALC 60 02/26/2024   LDLCALC 85 03/01/2020   LDLCALC 100 (H) 11/27/2019   Chronic and stable.  Refilled Crestor  10 mg daily.  Orders:   rosuvastatin  (CRESTOR ) 10 MG tablet; Take 1 tablet (10 mg total) by mouth daily.  Return in about 1 month (around 07/28/2024) for chronic condition management, or sooner if needed.  Ozell Kung MD 06/24/2024, 12:11 PM         [1]  Outpatient Medications Prior to Visit  Medication Sig   Blood Glucose Monitoring Suppl DEVI 1 each by Does not apply route 3 (three) times daily. May dispense any manufacturer covered by patient's insurance.   Glucose Blood (BLOOD GLUCOSE TEST STRIPS) STRP 1 each by Does not apply route 3 (three) times daily. Use as directed to check blood sugar. May dispense any manufacturer covered by patient's insurance and fits patient's device.   Insulin   Degludec FlexTouch 200 UNIT/ML SOPN Inject 20 Units into the skin daily.   Insulin  Pen Needle 31G X 8 MM MISC Use to inject insulin  as directed   Lancet Device MISC 1 each by Does not apply route 3 (three) times daily. May dispense any manufacturer covered by patient's insurance.   Lancets MISC 1 each by Does not apply route 3 (three) times daily. Use as directed to check blood sugar. May dispense any manufacturer covered by patient's insurance and fits patient's device.   [DISCONTINUED] hydrochlorothiazide  (MICROZIDE ) 12.5 MG capsule Take 1 capsule (12.5 mg total) by mouth daily.   [DISCONTINUED] losartan  (COZAAR ) 100 MG tablet Take 1 tablet (100 mg total) by mouth daily.   [DISCONTINUED] metFORMIN  (GLUCOPHAGE ) 1000 MG tablet Take 1 tablet (1,000 mg total) by mouth 2 (two) times daily  with a meal.   [DISCONTINUED] rosuvastatin  (CRESTOR ) 10 MG tablet Take 1 tablet (10 mg total) by mouth daily.   No facility-administered medications prior to visit.   "

## 2024-06-24 NOTE — Assessment & Plan Note (Addendum)
 Of left great toe.  Not bothering him too much today. Orders:   Ambulatory referral to Podiatry

## 2024-06-24 NOTE — Telephone Encounter (Signed)
 [place] [facility] (Key: [id number]) Rx #: [id number] Mounjaro  2.5MG /0.5ML auto-injectors Form [employer] [place] [employer] Electronic Request Form Created 2 hours ago Sent to Plan 36 minutes ago Plan Response 36 minutes ago Submit Clinical Questions 35 minutes ago Determination Favorable 7 minutes ago Message from [employer] Approved. . Authorization Expiration Date: June 24, 2025.

## 2024-06-24 NOTE — Telephone Encounter (Signed)
 Prior Authorization for patient (Mounjaro  2.5MG /0.5ML auto-injectors) came through on cover my meds was submitted with last office notes and labs awaiting approval or denial.  KEY:[id number]

## 2024-06-24 NOTE — Assessment & Plan Note (Addendum)
 STOP-BANG 6-7 with witnessed apneic episode during prior hospitalization.  Referred for sleep study.  Mounjaro  started. Orders:   PSG SLEEP STUDY

## 2024-06-24 NOTE — Patient Instructions (Signed)
 VISIT SUMMARY: Today, you had a checkup to manage your diabetes, hypertension, weight, and other health concerns. Your blood sugar levels have improved significantly, and we discussed new medications to help with diabetes and weight loss. We also addressed your high blood pressure, weight management, potential sleep apnea, and an ingrown toenail.  YOUR PLAN: TYPE 2 DIABETES MELLITUS: Your blood sugar levels are within the target range, and your A1c has improved to 6.4%. -Continue taking metformin  and insulin  as prescribed. -We have started you on Mounjaro  to help manage your diabetes and assist with weight loss. Be aware of potential risks like pancreatitis, thyroid cancer, and common side effects. -We may consider using a continuous glucose monitor in the future.  HYPERTENSION: Your recent blood pressure readings are elevated. -We have discontinued losartan  and hydrochlorothiazide  and started you on a new combination antihypertensive medication. -If needed, we may add a third ingredient to your medication regimen. -Monitor your blood pressure at home using a home blood pressure cuff or public machines.  OBESITY: You have lost 2 pounds since your last visit and are motivated to lose more weight. -We have started you on Mounjaro  to help with weight loss. -Continue with dietary changes and regular exercise. -You have been referred to a dietitian for additional support.  SUSPECTED OBSTRUCTIVE SLEEP APNEA: You had breathing interruptions during sleep observed during your hospitalization, which may indicate sleep apnea. -You have been referred for a sleep study to evaluate for sleep apnea.  INGROWN TOENAIL, LEFT GREAT TOE: You have an ingrown toenail on your left great toe. -You have been referred to a podiatrist for evaluation and management.  HYPERLIPIDEMIA: Your cholesterol levels are being managed with Crestor . -Your Crestor  prescription has been refilled.  Take your prescription  medications as usual on the day of your doctor visit. Unless specifically instructed, there is no need to fast prior to laboratory blood testing.  Bring all of the medications that you take (including over the counter medications and supplements) with you to every clinic visit.  This after visit summary is an important review of tests, referrals, and medication changes that were discussed during your visit. If you have questions or concerns, call [phone]. Outside of clinic business hours, call the main hospital at [phone] and ask the operator for the on-call internal medicine resident.   Ozell Kung MD 06/24/2024, 12:06 PM

## 2024-06-24 NOTE — Assessment & Plan Note (Addendum)
 BP Readings from Last 3 Encounters:  06/24/24 (!) 142/81  02/26/24 137/83  12/11/23 138/87   Chronic with poor blood pressure control.  Elevated to 160 systolic on entering the clinic today.  On lisinopril 100 mg and HCTZ 12.5 mg daily, with recent interruption of the latter because he ran out of this medication at home.  Does not check blood pressure at home.  Recommend getting validated home blood pressure cuff for home measurements.  Discontinue lisinopril and HCTZ in favor of olmesartan -HCTZ 40-25 mg daily.  BMP today and at follow-up.  Orders:   olmesartan -hydrochlorothiazide  (BENICAR  HCT) 40-25 MG tablet; Take 1 tablet by mouth daily.   Basic metabolic panel with GFR   Basic metabolic panel with GFR; Future

## 2024-06-25 ENCOUNTER — Other Ambulatory Visit: Payer: Self-pay

## 2024-06-25 ENCOUNTER — Ambulatory Visit: Payer: Self-pay | Admitting: Student

## 2024-06-25 LAB — BASIC METABOLIC PANEL WITH GFR
BUN/Creatinine Ratio: 9 (ref 9–20)
BUN: 14 mg/dL (ref 6–24)
CO2: 22 mmol/L (ref 20–29)
Calcium: 9.6 mg/dL (ref 8.7–10.2)
Chloride: 105 mmol/L (ref 96–106)
Creatinine, Ser: 1.5 mg/dL — ABNORMAL HIGH (ref 0.76–1.27)
Glucose: 110 mg/dL — ABNORMAL HIGH (ref 70–99)
Potassium: 4.8 mmol/L (ref 3.5–5.2)
Sodium: 142 mmol/L (ref 134–144)
eGFR: 59 mL/min/1.73 — ABNORMAL LOW

## 2024-07-07 NOTE — Progress Notes (Signed)
 Internal Medicine Clinic Attending  Case discussed with the resident at the time of the visit.  We reviewed the resident's history and exam and pertinent patient test results.  I agree with the assessment, diagnosis, and plan of care documented in the resident's note.

## 2024-07-08 ENCOUNTER — Other Ambulatory Visit: Payer: Self-pay

## 2024-07-09 ENCOUNTER — Other Ambulatory Visit: Payer: Self-pay

## 2024-08-04 ENCOUNTER — Ambulatory Visit: Payer: Self-pay | Admitting: Dietician

## 2024-08-04 ENCOUNTER — Other Ambulatory Visit: Payer: Self-pay

## 2024-08-06 ENCOUNTER — Encounter: Payer: Self-pay | Admitting: Dietician

## 2024-08-11 ENCOUNTER — Ambulatory Visit: Admitting: Dietician

## 2024-08-11 ENCOUNTER — Other Ambulatory Visit
